# Patient Record
Sex: Female | Born: 1960
Health system: Southern US, Community
[De-identification: ages and names within clinical notes are randomized; demographics above are authoritative.]

## PROBLEM LIST (undated history)

## (undated) DIAGNOSIS — T7840XA Allergy, unspecified, initial encounter: Secondary | ICD-10-CM

## (undated) DIAGNOSIS — C801 Malignant (primary) neoplasm, unspecified: Secondary | ICD-10-CM

## (undated) DIAGNOSIS — Z8489 Family history of other specified conditions: Secondary | ICD-10-CM

## (undated) DIAGNOSIS — R112 Nausea with vomiting, unspecified: Secondary | ICD-10-CM

## (undated) DIAGNOSIS — E079 Disorder of thyroid, unspecified: Secondary | ICD-10-CM

## (undated) DIAGNOSIS — K589 Irritable bowel syndrome without diarrhea: Secondary | ICD-10-CM

## (undated) DIAGNOSIS — M1990 Unspecified osteoarthritis, unspecified site: Secondary | ICD-10-CM

## (undated) DIAGNOSIS — M069 Rheumatoid arthritis, unspecified: Secondary | ICD-10-CM

## (undated) DIAGNOSIS — K219 Gastro-esophageal reflux disease without esophagitis: Secondary | ICD-10-CM

## (undated) DIAGNOSIS — R519 Headache, unspecified: Secondary | ICD-10-CM

## (undated) DIAGNOSIS — J189 Pneumonia, unspecified organism: Secondary | ICD-10-CM

## (undated) DIAGNOSIS — Z9889 Other specified postprocedural states: Secondary | ICD-10-CM

## (undated) DIAGNOSIS — E039 Hypothyroidism, unspecified: Secondary | ICD-10-CM

## (undated) HISTORY — DX: Rheumatoid arthritis, unspecified: M06.9

## (undated) HISTORY — DX: Allergy, unspecified, initial encounter: T78.40XA

## (undated) HISTORY — DX: Unspecified osteoarthritis, unspecified site: M19.90

## (undated) HISTORY — DX: Irritable bowel syndrome, unspecified: K58.9

## (undated) HISTORY — PX: APPENDECTOMY: SHX54

## (undated) HISTORY — PX: HAND SURGERY: SHX662

## (undated) HISTORY — PX: ABDOMINAL HYSTERECTOMY: SHX81

## (undated) HISTORY — DX: Gastro-esophageal reflux disease without esophagitis: K21.9

---

## 1982-11-09 DIAGNOSIS — N739 Female pelvic inflammatory disease, unspecified: Secondary | ICD-10-CM

## 1982-11-09 HISTORY — DX: Female pelvic inflammatory disease, unspecified: N73.9

## 1982-11-09 HISTORY — PX: LAPAROTOMY: SHX154

## 1987-11-10 HISTORY — PX: LAPAROSCOPIC ENDOMETRIOSIS FULGURATION: SUR769

## 2001-11-09 HISTORY — PX: SINUS SURGERY WITH INSTATRAK: SHX5215

## 2005-11-09 HISTORY — PX: DIAGNOSTIC LAPAROSCOPY: SUR761

## 2009-08-07 ENCOUNTER — Ambulatory Visit: Payer: Self-pay | Admitting: Obstetrics and Gynecology

## 2009-08-16 ENCOUNTER — Emergency Department: Payer: Self-pay | Admitting: Internal Medicine

## 2009-09-06 ENCOUNTER — Ambulatory Visit: Payer: Self-pay | Admitting: Gastroenterology

## 2012-07-26 ENCOUNTER — Ambulatory Visit (INDEPENDENT_AMBULATORY_CARE_PROVIDER_SITE_OTHER): Payer: BC Managed Care – PPO | Admitting: Family Medicine

## 2012-07-26 VITALS — BP 102/66 | HR 64 | Temp 98.4°F | Resp 16 | Ht 63.0 in | Wt 120.6 lb

## 2012-07-26 DIAGNOSIS — J019 Acute sinusitis, unspecified: Secondary | ICD-10-CM

## 2012-07-26 DIAGNOSIS — M940 Chondrocostal junction syndrome [Tietze]: Secondary | ICD-10-CM

## 2012-07-26 DIAGNOSIS — J40 Bronchitis, not specified as acute or chronic: Secondary | ICD-10-CM

## 2012-07-26 MED ORDER — HYDROCOD POLST-CHLORPHEN POLST 10-8 MG/5ML PO LQCR: 5.0000 mL | Freq: Two times a day (BID) | ORAL | Status: DC | PRN

## 2012-07-26 MED ORDER — AZITHROMYCIN 250 MG PO TABS: ORAL_TABLET | ORAL | Status: DC

## 2012-07-26 NOTE — Patient Instructions (Addendum)
Costochondritis Costochondritis (Tietze syndrome), or costochondral separation, is a swelling and irritation (inflammation) of the tissue (cartilage) that connects your ribs with your breastbone (sternum). It may occur on its own (spontaneously), through damage caused by an accident (trauma), or simply from coughing or minor exercise. It may take up to 6 weeks to get better and longer if you are unable to be conservative in your activities. HOME CARE INSTRUCTIONS   Avoid exhausting physical activity. Try not to strain your ribs during normal activity. This would include any activities using chest, belly (abdominal), and side muscles, especially if heavy weights are used.   Use ice for 15 to 20 minutes per hour while awake for the first 2 days. Place the ice in a plastic bag, and place a towel between the bag of ice and your skin.   Only take over-the-counter or prescription medicines for pain, discomfort, or fever as directed by your caregiver.  SEEK IMMEDIATE MEDICAL CARE IF:   Your pain increases or you are very uncomfortable.   You have a fever.   You develop difficulty with your breathing.   You cough up blood.   You develop worse chest pains, shortness of breath, sweating, or vomiting.   You develop new, unexplained problems (symptoms).  MAKE SURE YOU:   Understand these instructions.   Will watch your condition.   Will get help right away if you are not doing well or get worse.  Document Released: 08/05/2005 Document Revised: 10/15/2011 Document Reviewed: 06/13/2008 [facility] Patient Information 2012 [place], [place].Sinusitis Sinuses are air pockets within the bones of your face. The growth of bacteria within a sinus leads to infection. The infection prevents the sinuses from draining. This infection is called sinusitis. SYMPTOMS  There will be different areas of pain depending on which sinuses have become infected.  The maxillary sinuses often produce pain beneath the eyes.     Frontal sinusitis may cause pain in the middle of the forehead and above the eyes.  Other problems (symptoms) include:  Toothaches.   Colored, pus-like (purulent) drainage from the nose.   Swelling, warmth, and tenderness over the sinus areas may be signs of infection.  TREATMENT  Sinusitis is most often determined by an exam.X-rays may be taken. If x-rays have been taken, make sure you obtain your results or find out how you are to obtain them. Your caregiver may give you medications (antibiotics). These are medications that will help kill the bacteria causing the infection. You may also be given a medication (decongestant) that helps to reduce sinus swelling.  HOME CARE INSTRUCTIONS   Only take over-the-counter or prescription medicines for pain, discomfort, or fever as directed by your caregiver.   Drink extra fluids. Fluids help thin the mucus so your sinuses can drain more easily.   Applying either moist heat or ice packs to the sinus areas may help relieve discomfort.   Use saline nasal sprays to help moisten your sinuses. The sprays can be found at your local drugstore.  SEEK IMMEDIATE MEDICAL CARE IF:  You have a fever.   You have increasing pain, severe headaches, or toothache.   You have nausea, vomiting, or drowsiness.   You develop unusual swelling around the face or trouble seeing.  MAKE SURE YOU:   Understand these instructions.   Will watch your condition.   Will get help right away if you are not doing well or get worse.  Document Released: 10/26/2005 Document Revised: 10/15/2011 Document Reviewed: 05/25/2007 [employer] Patient Information 2012  [employer].Bronchitis Bronchitis is the body's way of reacting to injury and/or infection (inflammation) of the bronchi. Bronchi are the air tubes that extend from the windpipe into the lungs. If the inflammation becomes severe, it may cause shortness of breath. CAUSES  Inflammation may be caused by:  A virus.    Germs (bacteria).   Dust.   Allergens.   Pollutants and many other irritants.  The cells lining the bronchial tree are covered with tiny hairs (cilia). These constantly beat upward, away from the lungs, toward the mouth. This keeps the lungs free of pollutants. When these cells become too irritated and are unable to do their job, mucus begins to develop. This causes the characteristic cough of bronchitis. The cough clears the lungs when the cilia are unable to do their job. Without either of these protective mechanisms, the mucus would settle in the lungs. Then you would develop pneumonia. Smoking is a common cause of bronchitis and can contribute to pneumonia. Stopping this habit is the single most important thing you can do to help yourself. TREATMENT   Your caregiver may prescribe an antibiotic if the cough is caused by bacteria. Also, medicines that open up your airways make it easier to breathe. Your caregiver may also recommend or prescribe an expectorant. It will loosen the mucus to be coughed up. Only take over-the-counter or prescription medicines for pain, discomfort, or fever as directed by your caregiver.   Removing whatever causes the problem (smoking, for example) is critical to preventing the problem from getting worse.   Cough suppressants may be prescribed for relief of cough symptoms.   Inhaled medicines may be prescribed to help with symptoms now and to help prevent problems from returning.   For those with recurrent (chronic) bronchitis, there may be a need for steroid medicines.  SEEK IMMEDIATE MEDICAL CARE IF:   During treatment, you develop more pus-like mucus (purulent sputum).   You have a fever.   Your baby is older than 3 months with a rectal temperature of 102 F (38.9 C) or higher.   Your baby is 22 months old or younger with a rectal temperature of 100.4 F (38 C) or higher.   You become progressively more ill.   You have increased difficulty  breathing, wheezing, or shortness of breath.  It is necessary to seek immediate medical care if you are elderly or sick from any other disease. MAKE SURE YOU:   Understand these instructions.   Will watch your condition.   Will get help right away if you are not doing well or get worse.  Document Released: 10/26/2005 Document Revised: 10/15/2011 Document Reviewed: 09/04/2008 [facility] Patient Information 2012 [place], [place].

## 2012-07-26 NOTE — Progress Notes (Signed)
  Subjective:    Patient ID: Tina Olsen, female    DOB: [birth year], 51 y.o.   MRN: [id number]  HPI Pt became ill 1 wk ago w/ pharyngitis and sinus drainage. Then developed severe HA in occipital area, then developed dry cough about 3d ago but has continued to worsen. Has had a fever at night. Cough has now turned productive, lots of sick contacts. Has been very stressed out about work so not sleeping. Now having pressure in center of chest and back worse with cough. Chest tightness is constant x 3d and getting worse.  Not exertional.     Review of Systems  Constitutional: Positive for fever and fatigue. Negative for chills.  HENT: Positive for congestion, sore throat, rhinorrhea, neck pain, postnasal drip and sinus pressure. Negative for ear pain and neck stiffness.   Respiratory: Positive for cough and chest tightness. Negative for shortness of breath and wheezing.   Cardiovascular: Positive for chest pain. Negative for palpitations and leg swelling.  Gastrointestinal: Positive for nausea and diarrhea. Negative for vomiting, abdominal pain and constipation.  Genitourinary: Negative for difficulty urinating.  Musculoskeletal: Positive for back pain.  Skin: Negative for rash.  Neurological: Positive for headaches. Negative for syncope.  Psychiatric/Behavioral: Positive for disturbed wake/sleep cycle.       Objective:   Physical Exam  Vitals reviewed. Constitutional: She is oriented to person, place, and time. She appears well-developed and well-nourished.  HENT:  Head: Normocephalic and atraumatic.  Right Ear: Tympanic membrane, external ear and ear canal normal.  Left Ear: Tympanic membrane, external ear and ear canal normal.  Nose: Nose normal.  Mouth/Throat: Oropharynx is clear and moist. No oropharyngeal exudate.  Eyes: Conjunctivae normal and EOM are normal. Pupils are equal, round, and reactive to light. No scleral icterus.  Neck: Normal range of motion. Neck supple. No  thyromegaly present.  Cardiovascular: Normal rate, regular rhythm, normal heart sounds and intact distal pulses.   Pulmonary/Chest: Effort normal and breath sounds normal. No respiratory distress. She has no wheezes. She has no rales. She exhibits tenderness.  Lymphadenopathy:    She has cervical adenopathy.  Neurological: She is alert and oriented to person, place, and time.  Skin: Skin is warm and dry.  Psychiatric: She has a normal mood and affect. Her behavior is normal.          Assessment & Plan:  1. Sinusitis/bronchitis/costochondritis - pt prefers z-pack for abx treatment. Discussed symptomatic care w/ sinus rinse, nsaids. RTC if worsens at all for CXR - pt agrees.

## 2012-07-26 NOTE — Progress Notes (Signed)
Reviewed and agree.

## 2012-12-27 ENCOUNTER — Encounter: Payer: Self-pay | Admitting: Family Medicine

## 2012-12-27 ENCOUNTER — Ambulatory Visit: Payer: BC Managed Care – PPO

## 2012-12-27 ENCOUNTER — Ambulatory Visit (INDEPENDENT_AMBULATORY_CARE_PROVIDER_SITE_OTHER): Payer: BC Managed Care – PPO | Admitting: Family Medicine

## 2012-12-27 VITALS — BP 109/69 | HR 59 | Temp 98.2°F | Resp 16 | Ht 63.0 in | Wt 126.0 lb

## 2012-12-27 DIAGNOSIS — R079 Chest pain, unspecified: Secondary | ICD-10-CM

## 2012-12-27 DIAGNOSIS — R0781 Pleurodynia: Secondary | ICD-10-CM

## 2012-12-27 DIAGNOSIS — M25519 Pain in unspecified shoulder: Secondary | ICD-10-CM
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
## 2014-01-17 ENCOUNTER — Ambulatory Visit (INDEPENDENT_AMBULATORY_CARE_PROVIDER_SITE_OTHER): Payer: BC Managed Care – PPO | Admitting: Family Medicine

## 2014-01-17 VITALS — BP 100/60 | HR 58 | Temp 98.4°F | Resp 16 | Ht 63.0 in | Wt 127.0 lb

## 2014-01-17 DIAGNOSIS — M79644 Pain in right finger(s): Secondary | ICD-10-CM

## 2014-01-17 DIAGNOSIS — M79609 Pain in unspecified limb: Secondary | ICD-10-CM

## 2014-01-17 LAB — POCT SEDIMENTATION RATE: POCT SED RATE: 26 mm/hr — AB (ref 0–22)

## 2014-01-17 LAB — RHEUMATOID FACTOR: Rhuematoid fact SerPl-aCnc: 11 IU/mL (ref <=14)
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
## 2014-01-17 NOTE — Patient Instructions (Signed)
Schirmer test when you see the eye doctor

## 2014-01-18 ENCOUNTER — Telehealth: Payer: Self-pay

## 2014-01-18 LAB — ANA: Anti Nuclear Antibody(ANA): NEGATIVE

## 2014-01-18 MED ORDER — PREDNISONE 20 MG PO TABS: ORAL_TABLET | ORAL | Status: DC

## 2014-01-18 NOTE — Telephone Encounter (Signed)
See labs. Pt wants to know since everything was normal, if she should do a round of steroids. Please advise.

## 2014-01-18 NOTE — Telephone Encounter (Signed)
Patient called regarding lab results. Please return call  

## 2014-01-18 NOTE — Addendum Note (Signed)
Addended by: Robyn Haber on: 01/18/2014 08:46 PM   Modules accepted: Orders

## 2014-01-26 ENCOUNTER — Telehealth: Payer: Self-pay

## 2014-01-26 DIAGNOSIS — M79644 Pain in right finger(s): Secondary | ICD-10-CM

## 2014-01-26 NOTE — Telephone Encounter (Signed)
Patient states she was on prednisone for 5 days. Yesterday was her last day on the medication and she states the swelling on her R hand just started to reduce then. Patient states her hand is still sore and she notices redness today. She wants to know if she needs to continue on the medication or be seen. Please return call. Thank you.

## 2014-01-28 MED ORDER — PREDNISONE 20 MG PO TABS: ORAL_TABLET | ORAL | Status: DC

## 2014-01-28 NOTE — Telephone Encounter (Signed)
Needs to RTC if not improved

## 2014-01-29 NOTE — Telephone Encounter (Signed)
LM- advising pt rx sent to the pharmacy and to RTC if not improving.

## 2014-02-12 ENCOUNTER — Ambulatory Visit: Payer: Self-pay | Admitting: Obstetrics and Gynecology

## 2014-06-12 ENCOUNTER — Ambulatory Visit: Payer: Self-pay | Admitting: Obstetrics and Gynecology

## 2014-06-12 LAB — BASIC METABOLIC PANEL
ANION GAP: 6 — AB (ref 7–16)
BUN: 21 mg/dL — AB (ref 7–18)
CO2: 28 mmol/L (ref 21–32)
Calcium, Total: 8.9 mg/dL (ref 8.5–10.1)
Chloride: 107 mmol/L (ref 98–107)
Creatinine: 1.01 mg/dL (ref 0.60–1.30)
EGFR (African American): >60
Glucose: 91 mg/dL (ref 65–99)
OSMOLALITY: 284 (ref 275–301)
Potassium: 3.8 mmol/L (ref 3.5–5.1)
SODIUM: 141 mmol/L (ref 136–145)

## 2014-06-12 LAB — HEMOGLOBIN: HGB: 13.2 g/dL (ref 12.0–16.0)

## 2014-06-18 ENCOUNTER — Inpatient Hospital Stay: Payer: Self-pay | Admitting: Obstetrics and Gynecology

## 2014-06-19 LAB — BASIC METABOLIC PANEL
Anion Gap: 5 — ABNORMAL LOW (ref 7–16)
BUN: 13 mg/dL (ref 7–18)
CALCIUM: 8.8 mg/dL (ref 8.5–10.1)
CREATININE: 0.99 mg/dL (ref 0.60–1.30)
Chloride: 108 mmol/L — ABNORMAL HIGH (ref 98–107)
Co2: 26 mmol/L (ref 21–32)
EGFR (African American): >60
EGFR (Non-African Amer.): >60
Glucose: 139 mg/dL — ABNORMAL HIGH (ref 65–99)
Osmolality: 280 (ref 275–301)
Potassium: 4.4 mmol/L (ref 3.5–5.1)
Sodium: 139 mmol/L (ref 136–145)

## 2014-06-19 LAB — HEMATOCRIT: HCT: 37.3 % (ref 35.0–47.0)

## 2014-06-21 LAB — PATHOLOGY REPORT

## 2015-03-02 NOTE — Op Note (Signed)
PATIENT NAME:  Tina Olsen, Tina Olsen MR#:  [id number] DATE OF BIRTH:  06/10/1961  DATE OF PROCEDURE:  06/18/2014  PREOPERATIVE DIAGNOSIS: Persistent cervical dysplasia.   POSTOPERATIVE DIAGNOSES: 1. Persistent cervical dysplasia.  2. Pelvic adhesions.  3. Endometriosis.   ANESTHESIA: General endotracheal anesthesia.   PROCEDURES:  1. Laparoscopic evaluation with conversion to exploratory laparotomy.  2. Total abdominal hysterectomy and right salpingectomy.   SURGEON: Boykin Nearing, M.D.   FIRST ASSISTANT: Benjaman Kindler, M.D.   PROCEDURE: After adequate general endotracheal anesthesia, the patient was placed in the dorsal supine position, legs placed in the [place] stirrups. Abdominal, perineal and vaginal prep was performed. The patient was straight catheterized yielding 100 mL of clear urine. The patient did receive 2 grams IV cefoxitin prior to commencement of the case. Gloves were changed and a 12 mm infraumbilical incision was made after injection with 0.5% Marcaine. The laparoscope was advanced into the abdominal cavity under direct visualization with the Optiview cannula. The patient's abdomen was insufflated. The initial impression showed that there was omental adhesions draping over the infraumbilical port site. Could not visualize the anterior abdominal wall being there was a full shelf of omental adhesions over the top of this. After evaluating for 10 minutes, the surgeon felt that it would be prudent to convert this to an abdominal hysterectomy given the poor visualization.   The patient's abdomen was deflated. The infraumbilical incision was closed with a 2-0 Vicryl deep layer and an interrupted 4-0 Vicryl incisional layer. Dr. Benjaman Kindler, who is new to [facility], scrubbed in and was used as a Equities trader.   A Pfannenstiel incision was made 2 fingerbreadths above the symphysis pubis. Sharp dissection was used to identify the fascia. The fascia was opened  in the midline and opened in a transverse fashion. The superior aspect of the fascia was grasped with Coker clamps and the recti muscles were dissected free. The inferior aspect of the fascia was grasped with Coker clamps and the pyramidalis muscle was dissected free. Entry into the peritoneal cavity was accomplished sharply. The peritoneum was opened in a caudad and cephalad fashion. The O'Connor-O'Sullivan retractor was brought up to the operative field, and the bowel was packed cephalad. There were several dense adhesions of the lateral aspect of the descending sigmoid to the left cornua. Of note, the patient is status post a left salpingo-oophorectomy for a prior tubal ovarian abscess. Two large Kelly's were placed on the cornua bilaterally and the round ligaments were bilaterally suture ligated and transected with 0 Vicryl suture. The anterior leaf of the broad ligament was incised and a bladder flap was created and the bladder was reflected inferiorly. Gentle dissection of the left sigmoid off from the medial aspect of the broad ligament aided for making a window in the left broad ligament. Two Heaney-Ballantine clamps were placed through this window and the pedicle was transected and doubly ligated with 0 Vicryl suture.   A similar procedure was repeated on the patient's right side. Through this window, the proximal portion of the fallopian tube and tubo-ovarian ligaments were incorporated in this. The uterine arteries were bilaterally skeletonized and doubly clamped with Heaney-Ballantine clamps and transected and suture ligated with 0 Vicryl suture. Straight ligaments were used to clamp the cardinal ligaments, and, again, these pedicles were ligated with 0 Vicryl suture. Ultimately curved Heaney clamps were placed in the vaginal angles and the vagina was entered with Jorgenson scissors and the cervix and uterus were removed intact.   Angles  were closed with 0 Vicryl suture and the rest of the vaginal  cuff was closed with interrupted 0 Vicryl suture. Of note, there was a hemosiderin nodule in the central portion between the uterosacral ligaments approximately 5 x 5 mm.   Attention was directed to the patient's right fallopian tube, which the fimbriated end was grasped with a Babcock clamp and the fallopian tube was then clamped and removed and sutured with 0 Vicryl suture. Good hemostasis was noted from this pedicle. The patient's abdomen was copiously irrigated. Good hemostasis was noted, and all sutures were removed from the vaginal cuff. The right ureter was identified with normal peristaltic activity and the left was not identified given the adhesions to the left sidewall.   All sponges and instruments were then removed from the patient's abdomen, and the fascia was then closed with a running 0 Vicryl suture. The subcutaneous tissues were irrigated and bovied for hemostasis, and the skin was reapproximated with absorbable staples, Insorb stapler used. The patient tolerated the procedure well.   Estimated blood loss 50 mL. Urine output 100 mL. Intraoperative fluids 1500 mL.   The patient was taken to the recovery room in good condition.     ____________________________ Boykin Nearing, MD tjs:jr D: 06/18/2014 12:51:30 ET T: 06/18/2014 16:35:42 ET JOB#: [id number]  cc: Boykin Nearing, MD, <Dictator> Boykin Nearing MD ELECTRONICALLY SIGNED 06/19/2014 9:06

## 2015-07-14 ENCOUNTER — Ambulatory Visit (INDEPENDENT_AMBULATORY_CARE_PROVIDER_SITE_OTHER): Payer: BLUE CROSS/BLUE SHIELD | Admitting: Internal Medicine

## 2015-07-14 VITALS — BP 108/66 | HR 55 | Temp 98.6°F | Resp 16 | Ht 63.5 in | Wt 130.4 lb

## 2015-07-14 DIAGNOSIS — S71151A Open bite, right thigh, initial encounter: Secondary | ICD-10-CM

## 2015-07-14 DIAGNOSIS — W540XXA Bitten by dog, initial encounter: Secondary | ICD-10-CM

## 2015-07-14 MED ORDER — AMOXICILLIN-POT CLAVULANATE 875-125 MG PO TABS: 1.0000 | ORAL_TABLET | Freq: Two times a day (BID) | ORAL | Status: DC

## 2015-07-14 NOTE — Patient Instructions (Signed)
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
## 2015-11-06 ENCOUNTER — Ambulatory Visit (INDEPENDENT_AMBULATORY_CARE_PROVIDER_SITE_OTHER): Payer: BLUE CROSS/BLUE SHIELD | Admitting: Family Medicine

## 2015-11-06 VITALS — BP 110/70 | HR 77 | Temp 98.1°F | Resp 20 | Ht 63.0 in | Wt 131.8 lb

## 2015-11-06 DIAGNOSIS — Z8742 Personal history of other diseases of the female genital tract: Secondary | ICD-10-CM

## 2015-11-06 DIAGNOSIS — Z23 Encounter for immunization: Secondary | ICD-10-CM | POA: Diagnosis not present

## 2015-11-06 DIAGNOSIS — J019 Acute sinusitis, unspecified: Secondary | ICD-10-CM | POA: Diagnosis not present

## 2015-11-06 DIAGNOSIS — Z8619 Personal history of other infectious and parasitic diseases: Secondary | ICD-10-CM

## 2015-11-06 MED ORDER — FLUCONAZOLE 150 MG PO TABS: 150.0000 mg | ORAL_TABLET | Freq: Once | ORAL | Status: DC

## 2015-11-06 MED ORDER — AMOXICILLIN-POT CLAVULANATE 875-125 MG PO TABS: 1.0000 | ORAL_TABLET | Freq: Two times a day (BID) | ORAL | Status: DC

## 2015-11-06 NOTE — Progress Notes (Signed)
Subjective:  By signing my name below, I, Tina Olsen, attest that this documentation has been prepared under the direction and in the presence of Tina Ray, MD.  Electronically Signed: [name], ED Scribe. 11/06/2015. 4:55 PM.   Patient ID: Tina Olsen, female    DOB: May 12, 1961, 54 y.o.   MRN: [id number]  HPI Chief Complaint  Patient presents with  . Sinusitis    x 2 week  . Nasal Congestion  . Flu Vaccine   HPI Comments: STPHANIE Olsen is a 53 y.o. female who presents to the [facility] complaining of sinus congestion that began 1.5 weeks ago. She reports symptoms initially consisted of fever, nasal congestion, ear pressure, sinus pressure, productive cough, chest congestion, HA , and dental pain. States symptoms started to improve but worsened 1 week ago. Reports fever, sinus pressure, ear pressures and lymphadenopathy has subsided. States cough was initially dry but has become productive. She has been taking mucinex and cough syrup with some relief. Pt is allergic to Sulfa abx.   Pt is also requesting a flu shot.  There are no active problems to display for this patient.  Past Medical History  Diagnosis Date  . Allergy   . Arthritis    Past Surgical History  Procedure Laterality Date  . Sinus surgery with instatrak  2003  . Abdominal hysterectomy     Allergies  Allergen Reactions  . Sulfa Antibiotics Rash   Prior to Admission medications   Medication Sig Start Date End Date Taking? Authorizing Provider  calcium-vitamin D (OSCAL WITH D) 500-200 MG-UNIT per tablet Take 3 tablets by mouth daily.   Yes Historical Provider, MD  cetirizine (ZYRTEC) 10 MG tablet Take 10 mg by mouth daily.   Yes Historical Provider, MD  DICLOFENAC PO Take by mouth.   Yes Historical Provider, MD  triamcinolone (NASACORT) 55 MCG/ACT AERO nasal inhaler Place 2 sprays into the nose daily.   Yes Historical Provider, MD  amoxicillin-clavulanate (AUGMENTIN)  875-125 MG per tablet Take 1 tablet by mouth 2 (two) times daily. With food Patient not taking: Reported on 11/06/2015 07/14/15   Tina Koyanagi, MD   Social History   Social History  . Marital Status: Single    Spouse Name: N/A  . Number of Children: N/A  . Years of Education: N/A   Occupational History  . Not on file.   Social History Main Topics  . Smoking status: Former Smoker    Quit date: 05/09/2002  . Smokeless tobacco: Not on file  . Alcohol Use: Not on file  . Drug Use: Not on file  . Sexual Activity: Not on file   Other Topics Concern  . Not on file   Social History Narrative   Review of Systems  Constitutional: Positive for fever ( improved), chills and fatigue.  HENT: Positive for congestion, dental problem, ear pain ( improved) and sinus pressure.   Respiratory: Positive for cough.   Gastrointestinal: Negative for vomiting and diarrhea.  Neurological: Positive for headaches.       Objective:   Physical Exam  Constitutional: She is oriented to person, place, and time. She appears well-developed and well-nourished. No distress.  HENT:  Head: Normocephalic and atraumatic. Not macrocephalic.  Right Ear: Hearing, tympanic membrane, external ear and ear canal normal.  Left Ear: Hearing, tympanic membrane, external ear and ear canal normal.  Nose: Right sinus exhibits frontal sinus tenderness. Right sinus exhibits no maxillary sinus tenderness. Left sinus exhibits  maxillary sinus tenderness and frontal sinus tenderness.  Mouth/Throat: Oropharynx is clear and moist. No oropharyngeal exudate.  TM's Pearly grey.  Slight erythema and edema of the turbinates. Slight yellow discharge.   Eyes: Conjunctivae and EOM are normal. Pupils are equal, round, and reactive to light.  Neck: Neck supple.  Cardiovascular: Normal rate, regular rhythm, normal heart sounds and intact distal pulses.   No murmur heard. Pulmonary/Chest: Effort normal and breath sounds normal. No  respiratory distress. She has no wheezes. She has no rhonchi.  Musculoskeletal: Normal range of motion.  Neurological: She is alert and oriented to person, place, and time.  Skin: Skin is warm and dry. No rash noted.  Psychiatric: She has a normal mood and affect. Her behavior is normal.  Nursing note and vitals reviewed.  Filed Vitals:   11/06/15 1445  BP: 110/70  Pulse: 77  Temp: 98.1 F (36.7 C)  TempSrc: Oral  Resp: 20  Height: 5\' 3"  (1.6 m)  Weight: 131 lb 12.8 oz (59.784 kg)  SpO2: 98%    Assessment & Plan:   [name] is a 54 y.o. female Acute sinusitis, recurrence not specified, unspecified location - Plan: amoxicillin-clavulanate (AUGMENTIN) 875-125 MG tablet  - secondary sinusitis after initial URI likely. Afebrile, reassuring exam.  -Augmentin 875 mm twice a day 10 days. Side effects discussed.  -Diflucan if needed as history of yeast  infections after antibiotics.  -RTC precautions.  Flu vaccine need - Plan: Flu Vaccine QUAD 36+ mos IM given.  History of candidal vulvovaginitis - Plan: fluconazole (DIFLUCAN) 150 MG tablet  -Diflucan given if needed as above.  Meds ordered this encounter  Medications  . amoxicillin-clavulanate (AUGMENTIN) 875-125 MG tablet    Sig: Take 1 tablet by mouth 2 (two) times daily.    Dispense:  20 tablet    Refill:  0  . fluconazole (DIFLUCAN) 150 MG tablet    Sig: Take 1 tablet (150 mg total) by mouth once.    Dispense:  1 tablet    Refill:  1   Patient Instructions  Saline nasal spray atleast 4 times per day, over the counter mucinex as needed, drink plenty of fluids. Augmentin as discussed. Diflucan if needed after completion of antibiotic and symptoms of a yeast infection occur.  Return to the clinic or go to the nearest emergency room if any of your symptoms worsen or new symptoms occur.  Sinusitis, Adult Sinusitis is redness, soreness, and inflammation of the paranasal sinuses. Paranasal sinuses are air pockets  within the bones of your face. They are located beneath your eyes, in the middle of your forehead, and above your eyes. In healthy paranasal sinuses, mucus is able to drain out, and air is able to circulate through them by way of your nose. However, when your paranasal sinuses are inflamed, mucus and air can become trapped. This can allow bacteria and other germs to grow and cause infection. Sinusitis can develop quickly and last only a short time (acute) or continue over a long period (chronic). Sinusitis that lasts for more than 12 weeks is considered chronic. CAUSES Causes of sinusitis include:  Allergies.  Structural abnormalities, such as displacement of the cartilage that separates your nostrils (deviated septum), which can decrease the air flow through your nose and sinuses and affect sinus drainage.  Functional abnormalities, such as when the Olsen hairs (cilia) that line your sinuses and help remove mucus do not work properly or are not present. SIGNS AND SYMPTOMS Symptoms of acute  and chronic sinusitis are the same. The primary symptoms are pain and pressure around the affected sinuses. Other symptoms include:  Upper toothache.  Earache.  Headache.  Bad breath.  Decreased sense of smell and taste.  A cough, which worsens when you are lying flat.  Fatigue.  Fever.  Thick drainage from your nose, which often is green and may contain pus (purulent).  Swelling and warmth over the affected sinuses. DIAGNOSIS Your health care provider will perform a physical exam. During your exam, your health care provider may perform any of the following to help determine if you have acute sinusitis or chronic sinusitis:  Look in your nose for signs of abnormal growths in your nostrils (nasal polyps).  Tap over the affected sinus to check for signs of infection.  View the inside of your sinuses using an imaging device that has a light attached (endoscope). If your health care provider  suspects that you have chronic sinusitis, one or more of the following tests may be recommended:  Allergy tests.  Nasal culture. A sample of mucus is taken from your nose, sent to a lab, and screened for bacteria.  Nasal cytology. A sample of mucus is taken from your nose and examined by your health care provider to determine if your sinusitis is related to an allergy. TREATMENT Most cases of acute sinusitis are related to a viral infection and will resolve on their own within 10 days. Sometimes, medicines are prescribed to help relieve symptoms of both acute and chronic sinusitis. These may include pain medicines, decongestants, nasal steroid sprays, or saline sprays. However, for sinusitis related to a bacterial infection, your health care provider will prescribe antibiotic medicines. These are medicines that will help kill the bacteria causing the infection. Rarely, sinusitis is caused by a fungal infection. In these cases, your health care provider will prescribe antifungal medicine. For some cases of chronic sinusitis, surgery is needed. Generally, these are cases in which sinusitis recurs more than 3 times per year, despite other treatments. HOME CARE INSTRUCTIONS  Drink plenty of water. Water helps thin the mucus so your sinuses can drain more easily.  Use a humidifier.  Inhale steam 3-4 times a day (for example, sit in the bathroom with the shower running).  Apply a warm, moist washcloth to your face 3-4 times a day, or as directed by your health care provider.  Use saline nasal sprays to help moisten and clean your sinuses.  Take medicines only as directed by your health care provider.  If you were prescribed either an antibiotic or antifungal medicine, finish it all even if you start to feel better. SEEK IMMEDIATE MEDICAL CARE IF:  You have increasing pain or severe headaches.  You have nausea, vomiting, or drowsiness.  You have swelling around your face.  You have vision  problems.  You have a stiff neck.  You have difficulty breathing.   This information is not intended to replace advice given to you by your health care provider. Make sure you discuss any questions you have with your health care provider.   Document Released: 10/26/2005 Document Revised: 11/16/2014 Document Reviewed: 11/10/2011 Elsevier Interactive Patient Education [employer].    I personally performed the services described in this documentation, which was scribed in my presence. The recorded information has been reviewed and considered, and addended by me as needed.

## 2015-11-06 NOTE — Patient Instructions (Signed)
Saline nasal spray atleast 4 times per day, over the counter mucinex as needed, drink plenty of fluids. Augmentin as discussed. Diflucan if needed after completion of antibiotic and symptoms of a yeast infection occur.  Return to the clinic or go to the nearest emergency room if any of your symptoms worsen or new symptoms occur.  Sinusitis, Adult Sinusitis is redness, soreness, and inflammation of the paranasal sinuses. Paranasal sinuses are air pockets within the bones of your face. They are located beneath your eyes, in the middle of your forehead, and above your eyes. In healthy paranasal sinuses, mucus is able to drain out, and air is able to circulate through them by way of your nose. However, when your paranasal sinuses are inflamed, mucus and air can become trapped. This can allow bacteria and other germs to grow and cause infection. Sinusitis can develop quickly and last only a short time (acute) or continue over a long period (chronic). Sinusitis that lasts for more than 12 weeks is considered chronic. CAUSES Causes of sinusitis include:  Allergies.  Structural abnormalities, such as displacement of the cartilage that separates your nostrils (deviated septum), which can decrease the air flow through your nose and sinuses and affect sinus drainage.  Functional abnormalities, such as when the small hairs (cilia) that line your sinuses and help remove mucus do not work properly or are not present. SIGNS AND SYMPTOMS Symptoms of acute and chronic sinusitis are the same. The primary symptoms are pain and pressure around the affected sinuses. Other symptoms include:  Upper toothache.  Earache.  Headache.  Bad breath.  Decreased sense of smell and taste.  A cough, which worsens when you are lying flat.  Fatigue.  Fever.  Thick drainage from your nose, which often is green and may contain pus (purulent).  Swelling and warmth over the affected sinuses. DIAGNOSIS Your health care  provider will perform a physical exam. During your exam, your health care provider may perform any of the following to help determine if you have acute sinusitis or chronic sinusitis:  Look in your nose for signs of abnormal growths in your nostrils (nasal polyps).  Tap over the affected sinus to check for signs of infection.  View the inside of your sinuses using an imaging device that has a light attached (endoscope). If your health care provider suspects that you have chronic sinusitis, one or more of the following tests may be recommended:  Allergy tests.  Nasal culture. A sample of mucus is taken from your nose, sent to a lab, and screened for bacteria.  Nasal cytology. A sample of mucus is taken from your nose and examined by your health care provider to determine if your sinusitis is related to an allergy. TREATMENT Most cases of acute sinusitis are related to a viral infection and will resolve on their own within 10 days. Sometimes, medicines are prescribed to help relieve symptoms of both acute and chronic sinusitis. These may include pain medicines, decongestants, nasal steroid sprays, or saline sprays. However, for sinusitis related to a bacterial infection, your health care provider will prescribe antibiotic medicines. These are medicines that will help kill the bacteria causing the infection. Rarely, sinusitis is caused by a fungal infection. In these cases, your health care provider will prescribe antifungal medicine. For some cases of chronic sinusitis, surgery is needed. Generally, these are cases in which sinusitis recurs more than 3 times per year, despite other treatments. HOME CARE INSTRUCTIONS  Drink plenty of water. Water helps thin  the mucus so your sinuses can drain more easily.  Use a humidifier.  Inhale steam 3-4 times a day (for example, sit in the bathroom with the shower running).  Apply a warm, moist washcloth to your face 3-4 times a day, or as directed by  your health care provider.  Use saline nasal sprays to help moisten and clean your sinuses.  Take medicines only as directed by your health care provider.  If you were prescribed either an antibiotic or antifungal medicine, finish it all even if you start to feel better. SEEK IMMEDIATE MEDICAL CARE IF:  You have increasing pain or severe headaches.  You have nausea, vomiting, or drowsiness.  You have swelling around your face.  You have vision problems.  You have a stiff neck.  You have difficulty breathing.   This information is not intended to replace advice given to you by your health care provider. Make sure you discuss any questions you have with your health care provider.   Document Released: 10/26/2005 Document Revised: 11/16/2014 Document Reviewed: 11/10/2011 Elsevier Interactive Patient Education [employer].

## 2015-11-10 DIAGNOSIS — E079 Disorder of thyroid, unspecified: Secondary | ICD-10-CM

## 2015-11-10 HISTORY — PX: BREAST BIOPSY: SHX20

## 2015-11-10 HISTORY — DX: Disorder of thyroid, unspecified: E07.9

## 2016-01-16 ENCOUNTER — Ambulatory Visit (INDEPENDENT_AMBULATORY_CARE_PROVIDER_SITE_OTHER): Payer: BLUE CROSS/BLUE SHIELD | Admitting: Family Medicine

## 2016-01-16 VITALS — BP 128/68 | HR 70 | Temp 97.5°F | Resp 19 | Ht 63.0 in | Wt 133.8 lb

## 2016-01-16 DIAGNOSIS — J019 Acute sinusitis, unspecified: Secondary | ICD-10-CM | POA: Diagnosis not present

## 2016-01-16 DIAGNOSIS — B373 Candidiasis of vulva and vagina: Secondary | ICD-10-CM

## 2016-01-16 DIAGNOSIS — B3731 Acute candidiasis of vulva and vagina: Secondary | ICD-10-CM

## 2016-01-16 DIAGNOSIS — Q181 Preauricular sinus and cyst: Secondary | ICD-10-CM | POA: Diagnosis not present

## 2016-01-16 MED ORDER — FLUCONAZOLE 150 MG PO TABS: 150.0000 mg | ORAL_TABLET | Freq: Once | ORAL | Status: DC

## 2016-01-16 MED ORDER — AMOXICILLIN-POT CLAVULANATE 875-125 MG PO TABS: 1.0000 | ORAL_TABLET | Freq: Two times a day (BID) | ORAL | Status: DC

## 2016-01-16 NOTE — Progress Notes (Signed)
Patient ID: Tina Olsen, female    DOB: 1961-07-31  Age: 55 y.o. MRN: [id number]  Chief Complaint  Patient presents with  . Sinusitis    x 3 week    Subjective:   Patient has a history of chronic recurrent sinusitis. She has not been running a fever but she's had purulent discharge from last 3 weeks. Started with a cold and then it is stated on with facial pain. She coughs up mucus that she thinks is just from postnasal drainage.  Current allergies, medications, problem list, past/family and social histories reviewed.  Objective:  BP 128/68 mmHg  Pulse 70  Temp(Src) 97.5 F (36.4 C) (Oral)  Resp 19  Ht 5\' 3"  (1.6 m)  Wt 133 lb 12.8 oz (60.691 kg)  BMI 23.71 kg/m2  SpO2 98%  TMs are normal. She has a poor 5 mm cyst on the floor of the left ear canal. Her throat is clear. Neck supple without nodes. She's tender over maxillary sinuses. Chest clear to auscultation. Heart regular without murmur.  Assessment & Plan:   Assessment: 1. Acute sinusitis, recurrence not specified, unspecified location   2. Cyst of ear canal   3. Monilial vaginitis       Plan: Treat maxillary sinusitis. Offered ENT referral but she will decide when she wants to do that.  No orders of the defined types were placed in this encounter.    Meds ordered this encounter  Medications  . fluconazole (DIFLUCAN) 150 MG tablet    Sig: Take 1 tablet (150 mg total) by mouth once.    Dispense:  2 tablet    Refill:  1  . amoxicillin-clavulanate (AUGMENTIN) 875-125 MG tablet    Sig: Take 1 tablet by mouth 2 (two) times daily.    Dispense:  28 tablet    Refill:  0         Patient Instructions   Take the Augmentin( amoxicillinclavuanate )1 twice daiy  Use diflucan if needed  See ENT if desired for cyst ear canal  Use flonase daily  Return if needed     Return if symptoms worsen or fail to improve.   Ataya Murdy, MD 01/16/2016

## 2016-01-16 NOTE — Patient Instructions (Signed)
Take the Augmentin( amoxicillinclavuanate )1 twice daiy  Use diflucan if needed  See ENT if desired for cyst ear canal  Use flonase daily  Return if needed

## 2016-02-03 ENCOUNTER — Other Ambulatory Visit: Payer: Self-pay | Admitting: Physician Assistant

## 2016-02-03 DIAGNOSIS — Z1231 Encounter for screening mammogram for malignant neoplasm of breast: Secondary | ICD-10-CM

## 2016-02-10 ENCOUNTER — Ambulatory Visit
Admission: RE | Admit: 2016-02-10 | Discharge: 2016-02-10 | Disposition: A | Discharge status: Home / Self Care | Payer: BLUE CROSS/BLUE SHIELD | Source: Ambulatory Visit | Attending: Physician Assistant | Admitting: Physician Assistant

## 2016-02-10 DIAGNOSIS — Z1231 Encounter for screening mammogram for malignant neoplasm of breast: Secondary | ICD-10-CM | POA: Diagnosis not present

## 2016-02-10 DIAGNOSIS — E538 Deficiency of other specified B group vitamins: Secondary | ICD-10-CM | POA: Diagnosis not present

## 2016-02-13 DIAGNOSIS — R946 Abnormal results of thyroid function studies: Secondary | ICD-10-CM | POA: Diagnosis not present

## 2016-02-13 DIAGNOSIS — E538 Deficiency of other specified B group vitamins: Secondary | ICD-10-CM | POA: Diagnosis not present

## 2016-02-13 DIAGNOSIS — R7989 Other specified abnormal findings of blood chemistry: Secondary | ICD-10-CM | POA: Insufficient documentation

## 2016-02-13 DIAGNOSIS — F520 Hypoactive sexual desire disorder: Secondary | ICD-10-CM | POA: Diagnosis not present

## 2016-02-19 ENCOUNTER — Other Ambulatory Visit: Payer: Self-pay | Admitting: Obstetrics and Gynecology

## 2016-02-19 DIAGNOSIS — E063 Autoimmune thyroiditis: Secondary | ICD-10-CM | POA: Diagnosis not present

## 2016-02-19 DIAGNOSIS — L570 Actinic keratosis: Secondary | ICD-10-CM | POA: Diagnosis not present

## 2016-02-19 DIAGNOSIS — E038 Other specified hypothyroidism: Secondary | ICD-10-CM | POA: Diagnosis not present

## 2016-02-19 DIAGNOSIS — F520 Hypoactive sexual desire disorder: Secondary | ICD-10-CM | POA: Diagnosis not present

## 2016-02-19 DIAGNOSIS — R928 Other abnormal and inconclusive findings on diagnostic imaging of breast: Secondary | ICD-10-CM

## 2016-02-24 ENCOUNTER — Ambulatory Visit
Admission: RE | Admit: 2016-02-24 | Discharge: 2016-02-24 | Disposition: A | Discharge status: Home / Self Care | Payer: BLUE CROSS/BLUE SHIELD | Source: Ambulatory Visit | Attending: Obstetrics and Gynecology | Admitting: Obstetrics and Gynecology

## 2016-02-24 DIAGNOSIS — R928 Other abnormal and inconclusive findings on diagnostic imaging of breast: Secondary | ICD-10-CM

## 2016-02-24 DIAGNOSIS — N63 Unspecified lump in breast: Secondary | ICD-10-CM | POA: Insufficient documentation

## 2016-02-24 DIAGNOSIS — N6489 Other specified disorders of breast: Secondary | ICD-10-CM | POA: Diagnosis not present

## 2016-02-25 ENCOUNTER — Other Ambulatory Visit: Payer: Self-pay | Admitting: Obstetrics and Gynecology

## 2016-02-25 DIAGNOSIS — N63 Unspecified lump in unspecified breast: Secondary | ICD-10-CM

## 2016-03-04 ENCOUNTER — Ambulatory Visit: Payer: BLUE CROSS/BLUE SHIELD

## 2016-03-05 DIAGNOSIS — F520 Hypoactive sexual desire disorder: Secondary | ICD-10-CM | POA: Diagnosis not present

## 2016-03-10 ENCOUNTER — Ambulatory Visit
Admission: RE | Admit: 2016-03-10 | Discharge: 2016-03-10 | Disposition: A | Discharge status: Home / Self Care | Payer: BLUE CROSS/BLUE SHIELD | Source: Ambulatory Visit | Attending: Obstetrics and Gynecology | Admitting: Obstetrics and Gynecology

## 2016-03-10 DIAGNOSIS — N63 Unspecified lump in unspecified breast: Secondary | ICD-10-CM

## 2016-03-10 DIAGNOSIS — D242 Benign neoplasm of left breast: Secondary | ICD-10-CM | POA: Insufficient documentation

## 2016-03-11 DIAGNOSIS — E538 Deficiency of other specified B group vitamins: Secondary | ICD-10-CM | POA: Diagnosis not present

## 2016-03-11 DIAGNOSIS — L570 Actinic keratosis: Secondary | ICD-10-CM | POA: Diagnosis not present

## 2016-03-11 LAB — SURGICAL PATHOLOGY

## 2016-03-12 DIAGNOSIS — F520 Hypoactive sexual desire disorder: Secondary | ICD-10-CM | POA: Diagnosis not present

## 2016-03-25 DIAGNOSIS — E038 Other specified hypothyroidism: Secondary | ICD-10-CM | POA: Diagnosis not present

## 2016-03-25 DIAGNOSIS — E063 Autoimmune thyroiditis: Secondary | ICD-10-CM | POA: Diagnosis not present

## 2016-05-13 DIAGNOSIS — E538 Deficiency of other specified B group vitamins: Secondary | ICD-10-CM | POA: Diagnosis not present

## 2016-05-13 DIAGNOSIS — L570 Actinic keratosis: Secondary | ICD-10-CM | POA: Diagnosis not present

## 2016-05-13 DIAGNOSIS — F520 Hypoactive sexual desire disorder: Secondary | ICD-10-CM | POA: Diagnosis not present

## 2016-05-14 DIAGNOSIS — M2550 Pain in unspecified joint: Secondary | ICD-10-CM | POA: Diagnosis not present

## 2016-05-14 DIAGNOSIS — Z79899 Other long term (current) drug therapy: Secondary | ICD-10-CM | POA: Diagnosis not present

## 2016-05-14 DIAGNOSIS — M0579 Rheumatoid arthritis with rheumatoid factor of multiple sites without organ or systems involvement: Secondary | ICD-10-CM | POA: Diagnosis not present

## 2016-05-18 DIAGNOSIS — E038 Other specified hypothyroidism: Secondary | ICD-10-CM | POA: Diagnosis not present

## 2016-05-18 DIAGNOSIS — E063 Autoimmune thyroiditis: Secondary | ICD-10-CM | POA: Diagnosis not present

## 2016-05-20 DIAGNOSIS — E038 Other specified hypothyroidism: Secondary | ICD-10-CM | POA: Diagnosis not present

## 2016-05-20 DIAGNOSIS — E063 Autoimmune thyroiditis: Secondary | ICD-10-CM | POA: Diagnosis not present

## 2016-06-17 DIAGNOSIS — F520 Hypoactive sexual desire disorder: Secondary | ICD-10-CM | POA: Diagnosis not present

## 2016-06-17 DIAGNOSIS — E538 Deficiency of other specified B group vitamins: Secondary | ICD-10-CM | POA: Diagnosis not present

## 2016-07-17 DIAGNOSIS — E538 Deficiency of other specified B group vitamins: Secondary | ICD-10-CM | POA: Diagnosis not present

## 2016-07-17 DIAGNOSIS — F520 Hypoactive sexual desire disorder: Secondary | ICD-10-CM | POA: Diagnosis not present

## 2016-08-17 DIAGNOSIS — F520 Hypoactive sexual desire disorder: Secondary | ICD-10-CM | POA: Diagnosis not present

## 2016-08-17 DIAGNOSIS — E538 Deficiency of other specified B group vitamins: Secondary | ICD-10-CM | POA: Diagnosis not present

## 2016-08-24 DIAGNOSIS — F520 Hypoactive sexual desire disorder: Secondary | ICD-10-CM | POA: Diagnosis not present

## 2016-09-09 DIAGNOSIS — F520 Hypoactive sexual desire disorder: Secondary | ICD-10-CM | POA: Diagnosis not present

## 2016-09-09 DIAGNOSIS — E063 Autoimmune thyroiditis: Secondary | ICD-10-CM | POA: Diagnosis not present

## 2016-09-09 DIAGNOSIS — E038 Other specified hypothyroidism: Secondary | ICD-10-CM | POA: Diagnosis not present

## 2016-09-14 DIAGNOSIS — E538 Deficiency of other specified B group vitamins: Secondary | ICD-10-CM | POA: Diagnosis not present

## 2016-09-14 DIAGNOSIS — F520 Hypoactive sexual desire disorder: Secondary | ICD-10-CM | POA: Diagnosis not present

## 2016-09-15 DIAGNOSIS — L814 Other melanin hyperpigmentation: Secondary | ICD-10-CM | POA: Diagnosis not present

## 2016-09-15 DIAGNOSIS — D1801 Hemangioma of skin and subcutaneous tissue: Secondary | ICD-10-CM | POA: Diagnosis not present

## 2016-09-15 DIAGNOSIS — L821 Other seborrheic keratosis: Secondary | ICD-10-CM | POA: Diagnosis not present

## 2016-09-15 DIAGNOSIS — Z85828 Personal history of other malignant neoplasm of skin: Secondary | ICD-10-CM | POA: Diagnosis not present

## 2016-09-15 DIAGNOSIS — L570 Actinic keratosis: Secondary | ICD-10-CM | POA: Diagnosis not present

## 2016-09-21 DIAGNOSIS — E063 Autoimmune thyroiditis: Secondary | ICD-10-CM | POA: Diagnosis not present

## 2016-09-21 DIAGNOSIS — E038 Other specified hypothyroidism: Secondary | ICD-10-CM | POA: Diagnosis not present

## 2016-10-14 DIAGNOSIS — F520 Hypoactive sexual desire disorder: Secondary | ICD-10-CM | POA: Diagnosis not present

## 2016-10-14 DIAGNOSIS — Z23 Encounter for immunization: Secondary | ICD-10-CM | POA: Diagnosis not present

## 2016-10-26 DIAGNOSIS — M2550 Pain in unspecified joint: Secondary | ICD-10-CM | POA: Diagnosis not present

## 2016-10-26 DIAGNOSIS — M0579 Rheumatoid arthritis with rheumatoid factor of multiple sites without organ or systems involvement: Secondary | ICD-10-CM | POA: Diagnosis not present

## 2016-10-26 DIAGNOSIS — Z79899 Other long term (current) drug therapy: Secondary | ICD-10-CM | POA: Diagnosis not present

## 2016-10-26 DIAGNOSIS — Z6823 Body mass index (BMI) 23.0-23.9, adult: Secondary | ICD-10-CM | POA: Diagnosis not present

## 2016-11-04 DIAGNOSIS — E063 Autoimmune thyroiditis: Secondary | ICD-10-CM | POA: Diagnosis not present

## 2016-11-04 DIAGNOSIS — E038 Other specified hypothyroidism: Secondary | ICD-10-CM | POA: Diagnosis not present

## 2016-11-06 DIAGNOSIS — E538 Deficiency of other specified B group vitamins: Secondary | ICD-10-CM | POA: Diagnosis not present

## 2016-11-06 DIAGNOSIS — F520 Hypoactive sexual desire disorder: Secondary | ICD-10-CM | POA: Diagnosis not present

## 2016-12-04 DIAGNOSIS — F520 Hypoactive sexual desire disorder: Secondary | ICD-10-CM | POA: Diagnosis not present

## 2016-12-04 DIAGNOSIS — E538 Deficiency of other specified B group vitamins: Secondary | ICD-10-CM | POA: Diagnosis not present

## 2017-01-07 DIAGNOSIS — F520 Hypoactive sexual desire disorder: Secondary | ICD-10-CM | POA: Diagnosis not present

## 2017-01-07 DIAGNOSIS — E538 Deficiency of other specified B group vitamins: Secondary | ICD-10-CM | POA: Diagnosis not present

## 2017-01-12 DIAGNOSIS — R946 Abnormal results of thyroid function studies: Secondary | ICD-10-CM | POA: Diagnosis not present

## 2017-01-19 DIAGNOSIS — E038 Other specified hypothyroidism: Secondary | ICD-10-CM | POA: Diagnosis not present

## 2017-01-19 DIAGNOSIS — R4589 Other symptoms and signs involving emotional state: Secondary | ICD-10-CM | POA: Diagnosis not present

## 2017-01-19 DIAGNOSIS — E063 Autoimmune thyroiditis: Secondary | ICD-10-CM | POA: Diagnosis not present

## 2017-01-22 ENCOUNTER — Ambulatory Visit (INDEPENDENT_AMBULATORY_CARE_PROVIDER_SITE_OTHER): Payer: BLUE CROSS/BLUE SHIELD | Admitting: Family Medicine

## 2017-01-22 VITALS — BP 94/60 | HR 62 | Temp 98.5°F | Resp 12 | Ht 63.0 in | Wt 135.4 lb

## 2017-01-22 DIAGNOSIS — J019 Acute sinusitis, unspecified: Secondary | ICD-10-CM | POA: Diagnosis not present

## 2017-01-22 DIAGNOSIS — J3089 Other allergic rhinitis: Secondary | ICD-10-CM | POA: Diagnosis not present

## 2017-01-22 DIAGNOSIS — J309 Allergic rhinitis, unspecified: Secondary | ICD-10-CM | POA: Insufficient documentation

## 2017-01-22 MED ORDER — AMOXICILLIN-POT CLAVULANATE 875-125 MG PO TABS: 1.0000 | ORAL_TABLET | Freq: Two times a day (BID) | ORAL | 0 refills | Status: DC

## 2017-01-22 MED ORDER — FLUCONAZOLE 150 MG PO TABS: 150.0000 mg | ORAL_TABLET | Freq: Once | ORAL | 0 refills | Status: DC

## 2017-01-22 MED ORDER — FLUCONAZOLE 150 MG PO TABS
150.0000 mg | ORAL_TABLET | Freq: Once | ORAL | 0 refills | Status: AC
Start: 2017-01-22 — End: 2017-01-22

## 2017-01-22 NOTE — Progress Notes (Signed)
Chief Complaint  Patient presents with  . Recurrent Sinusitis  . Headache    HPI  Pt reports that she has symptoms of sinusitis She reports that she has been having a week of symptoms + facial pressure + pain around the eyes and worse on the right side + postnasal drip + green nasal discharge She denies fevers or chills She is a nonsmoker She denies asthma   She has been taking afrin for 3 nights and zyrtec Her last episode was 01/16/16  Chronic rhinitis She reports that she has allergy testing that showed that she has mostly indoor allergies primarily dust and thus takes zyrtec She typically gets symptoms spring and fall patterns  Past Medical History:  Diagnosis Date  . Allergy   . Arthritis     Current Outpatient Prescriptions  Medication Sig Dispense Refill  . calcium-vitamin D (OSCAL WITH D) 500-200 MG-UNIT per tablet Take 3 tablets by mouth daily.    . cetirizine (ZYRTEC) 10 MG tablet Take 10 mg by mouth daily.    Marland Kitchen DICLOFENAC PO Take by mouth.    . levothyroxine (SYNTHROID, LEVOTHROID) 75 MCG tablet Take 75 mcg by mouth daily before breakfast.    . phenylephrine (NEO-SYNEPHRINE) 0.25 % nasal spray Place 1 spray into both nostrils every 6 (six) hours as needed for congestion.    . triamcinolone (NASACORT) 55 MCG/ACT AERO nasal inhaler Place 2 sprays into the nose daily.    Marland Kitchen amoxicillin-clavulanate (AUGMENTIN) 875-125 MG tablet Take 1 tablet by mouth 2 (two) times daily. 20 tablet 0  . fluconazole (DIFLUCAN) 150 MG tablet Take 1 tablet (150 mg total) by mouth once. Repeat second dose in 3 days. 2 tablet 0   No current facility-administered medications for this visit.     Allergies:  Allergies  Allergen Reactions  . Sulfa Antibiotics Rash    Past Surgical History:  Procedure Laterality Date  . ABDOMINAL HYSTERECTOMY    . SINUS SURGERY WITH INSTATRAK  2003    Social History   Social History  . Marital status: Single    Spouse name: N/A  . Number of  children: N/A  . Years of education: N/A   Social History Main Topics  . Smoking status: Former Smoker    Quit date: 05/09/2002  . Smokeless tobacco: Never Used  . Alcohol use None  . Drug use: Unknown  . Sexual activity: Not Asked   Other Topics Concern  . None   Social History Narrative  . None    ROS See hpi  Objective: Vitals:   01/22/17 1054  BP: 94/60  Pulse: 62  Resp: 12  Temp: 98.5 F (36.9 C)  TempSrc: Oral  SpO2: 97%  Weight: 135 lb 6.4 oz (61.4 kg)  Height: 5\' 3"  (1.6 m)    Physical Exam General: alert, oriented, in NAD Head: normocephalic, atraumatic, +frontal and maxillary sinus tenderness Eyes: EOM intact, no scleral icterus or conjunctival injection Ears: TM clear bilaterally Throat: no pharyngeal exudate or erythema Lymph: no posterior auricular, submental or cervical lymph adenopathy Heart: normal rate, normal sinus rhythm, no murmurs Lungs: clear to auscultation bilaterally, no wheezing   Assessment and Plan Leisel was seen today for recurrent sinusitis and headache.  Diagnoses and all orders for this visit:  Chronic nonseasonal allergic rhinitis due to other allergen Continue zyrtec Stop afrin and add back flonase  Acute sinusitis, recurrence not specified, unspecified location Pt with another episode of sinusitis Discussed that antibiotics can cause mild to severe reactions  Gave handout with information about antibiotics Advised adding probiotics Also add peppermint tea as a natural aide -     amoxicillin-clavulanate (AUGMENTIN) 875-125 MG tablet; Take 1 tablet by mouth 2 (two) times daily. -     fluconazole (DIFLUCAN) 150 MG tablet; Take 1 tablet (150 mg total) by mouth once. Repeat second dose in 3 days.     [place]

## 2017-01-22 NOTE — Patient Instructions (Addendum)
IF you received an x-ray today, you will receive an invoice from [facility] Radiology. Please contact [facility] at [phone] with questions or concerns regarding your invoice.   IF you received labwork today, you will receive an invoice from [facility]. Please contact [facility] at [phone] with questions or concerns regarding your invoice.   Our billing staff will not be able to assist you with questions regarding bills from these companies.  You will be contacted with the lab results as soon as they are available. The fastest way to get your results is to activate your My Chart account. Instructions are located on the last page of this paperwork. If you have not heard from [place] regarding the results in 2 weeks, please contact this office.     Antibiotic Medicine, Adult Antibiotic medicines treat infections caused by a type of germ called bacteria. They work by killing the bacteria that make you sick. When do I need to take antibiotics? You often need these medicines to treat bacterial infections, such as:  A urinary tract infection (UTI).  Strep throat.  Meningitis. This affects the spinal cord and brain.  A bad lung infection. You may start the medicines while your doctor waits for tests to come back. When the tests come back, your doctor may change or stop your medicine. When are antibiotics not needed? You do not need these medicines for most common illnesses, such as:  A cold.  The flu.  A sore throat. Antibiotics are not always needed for all infections caused by bacteria. Do not ask for these medicines, or take them, when they are not needed. What are the risks of taking antibiotics? Most antibiotics can cause an infection called Clostridium difficile.This causes watery poop (diarrhea). Let your doctor know right away if:  You have watery poop while taking an antibiotic.  You have watery poop after you stop taking an antibiotic. The illness can happen  weeks after you stop the medicine. You also have a risk of getting an infection in the future that antibiotics cannot treat (antibiotic-resistant infection). This type of infection can be dangerous. What else should I know about taking antibiotics?  You need to take the entire prescription.  Take the medicine for as long as told by your doctor.  Do not stop taking it even if you start to feel better.  Try not to miss any doses. If you miss a dose, call your doctor.  Birth control pills may not work. If you take birth control pills:  Keep on taking them.  Use a second form of birth control, such as a condom. Do this for as long as told by your doctor.  Ask your doctor:  How long to wait in between doses.  If you should take the medicine with food.  If there is anything you should stay away from while taking the antibiotic, such as:  Food.  Drinks.  Medicines.  If there are any side effects you should watch for.  Only take the medicines that your doctor told you to take. Do not take medicines that were given to someone else.  Drink a large glass of water with the medicine.  Ask the pharmacist for a tool to measure the medicine, such as:  A syringe.  A cup.  A spoon.  Throw away any extra medicine. Contact a doctor if:  You get worse.  You have new joint pain or muscle aches after starting the medicine.  You have side effects from  the medicine, such as:  Stomach pain.  Watery poop.  Feeling sick to your stomach (nausea). Get help right away if:  You have signs of a very bad allergic reaction. If this happens, stop taking the medicine right away. Signs may include:  Hives. These are raised, itchy, red bumps on the skin.  Skin rash.  Trouble breathing.  Wheezing.  Swelling.  Feeling dizzy.  Throwing up (vomiting).  Your pee (urine) is dark, or is the color of blood.  Your skin turns yellow.  You bruise easily.  You bleed easily.  You  have very bad watery poop and cramps in your belly.  You have a very bad headache. Summary  Antibiotics are often used to treat infections caused by bacteria.  Only take these medicines when needed.  Let your doctor know if you have watery poop while taking an antibiotic.  You need to take the entire prescription. This information is not intended to replace advice given to you by your health care provider. Make sure you discuss any questions you have with your health care provider. Document Released: 08/04/2008 Document Revised: 10/28/2016 Document Reviewed: 10/28/2016 Elsevier Interactive Patient Education  2017 [employer]  Sinusitis, Adult Sinusitis is soreness and inflammation of your sinuses. Sinuses are hollow spaces in the bones around your face. They are located:  Around your eyes.  In the middle of your forehead.  Behind your nose.  In your cheekbones. Your sinuses and nasal passages are lined with a stringy fluid (mucus). Mucus normally drains out of your sinuses. When your nasal tissues get inflamed or swollen, the mucus can get trapped or blocked so air cannot flow through your sinuses. This lets bacteria, viruses, and funguses grow, and that leads to infection. Follow these instructions at home: Medicines   Take, use, or apply over-the-counter and prescription medicines only as told by your doctor. These may include nasal sprays.  If you were prescribed an antibiotic medicine, take it as told by your doctor. Do not stop taking the antibiotic even if you start to feel better. Hydrate and Humidify   Drink enough water to keep your pee (urine) clear or pale yellow.  Use a cool mist humidifier to keep the humidity level in your home above 50%.  Breathe in steam for 10-15 minutes, 3-4 times a day or as told by your doctor. You can do this in the bathroom while a hot shower is running.  Try not to spend time in cool or dry air. Rest   Rest as much as  possible.  Sleep with your head raised (elevated).  Make sure to get enough sleep each night. General instructions   Put a warm, moist washcloth on your face 3-4 times a day or as told by your doctor. This will help with discomfort.  Wash your hands often with soap and water. If there is no soap and water, use hand sanitizer.  Do not smoke. Avoid being around people who are smoking (secondhand smoke).  Keep all follow-up visits as told by your doctor. This is important. Contact a doctor if:  You have a fever.  Your symptoms get worse.  Your symptoms do not get better within 10 days. Get help right away if:  You have a very bad headache.  You cannot stop throwing up (vomiting).  You have pain or swelling around your face or eyes.  You have trouble seeing.  You feel confused.  Your neck is stiff.  You have trouble breathing. This  information is not intended to replace advice given to you by your health care provider. Make sure you discuss any questions you have with your health care provider. Document Released: 04/13/2008 Document Revised: 06/21/2016 Document Reviewed: 08/21/2015 Elsevier Interactive Patient Education  2017 [employer].

## 2017-01-24 ENCOUNTER — Encounter (HOSPITAL_COMMUNITY): Payer: Self-pay | Admitting: *Deleted

## 2017-01-24 ENCOUNTER — Emergency Department (HOSPITAL_COMMUNITY): Payer: BLUE CROSS/BLUE SHIELD

## 2017-01-24 ENCOUNTER — Ambulatory Visit (HOSPITAL_COMMUNITY)
Admission: EM | Admit: 2017-01-24 | Discharge: 2017-01-24 | Disposition: A | Discharge status: Home / Self Care | Payer: BLUE CROSS/BLUE SHIELD | Source: Home / Self Care

## 2017-01-24 ENCOUNTER — Encounter (HOSPITAL_COMMUNITY): Payer: Self-pay | Admitting: Emergency Medicine

## 2017-01-24 ENCOUNTER — Emergency Department (HOSPITAL_COMMUNITY)
Admission: EM | Admit: 2017-01-24 | Discharge: 2017-01-24 | Disposition: A | Discharge status: Home / Self Care | Payer: BLUE CROSS/BLUE SHIELD | Attending: Emergency Medicine | Admitting: Emergency Medicine

## 2017-01-24 DIAGNOSIS — Z0389 Encounter for observation for other suspected diseases and conditions ruled out: Secondary | ICD-10-CM | POA: Diagnosis not present

## 2017-01-24 DIAGNOSIS — M25512 Pain in left shoulder: Secondary | ICD-10-CM | POA: Diagnosis not present

## 2017-01-24 DIAGNOSIS — R091 Pleurisy: Secondary | ICD-10-CM | POA: Insufficient documentation

## 2017-01-24 DIAGNOSIS — Z87891 Personal history of nicotine dependence: Secondary | ICD-10-CM | POA: Insufficient documentation

## 2017-01-24 DIAGNOSIS — Z79899 Other long term (current) drug therapy: Secondary | ICD-10-CM | POA: Diagnosis not present

## 2017-01-24 DIAGNOSIS — R079 Chest pain, unspecified: Secondary | ICD-10-CM | POA: Diagnosis not present

## 2017-01-24 HISTORY — DX: Disorder of thyroid, unspecified: E07.9

## 2017-01-24 LAB — I-STAT TROPONIN, ED: TROPONIN I, POC: 0 ng/mL (ref 0.00–0.08)

## 2017-01-24 LAB — D-DIMER, QUANTITATIVE (NOT AT ARMC): D-Dimer, Quant: 1.03 ug/mL-FEU — ABNORMAL HIGH (ref 0.00–0.50)

## 2017-01-24 MED ORDER — IOPAMIDOL (ISOVUE-370) INJECTION 76%
INTRAVENOUS | Status: AC
  Administered 2017-01-24: 100 mL
  Filled 2017-01-24: qty 100

## 2017-01-24 NOTE — ED Provider Notes (Signed)
CSN: [id number]     Arrival date & time 01/24/17  1408 History   None    Chief Complaint  Patient presents with  . Neck Pain  . Shoulder Pain   (Consider location/radiation/quality/duration/timing/severity/associated sxs/prior Treatment) 56 year old female presents with left sided shoulder pain, radiating down her left side and left chest. The pain started 2 nights ago while she was walking, she states it was very intense, very painful, she is unable to sleep, the pain somewhat resolved the next day, until she rode on her bicycle, and it returned. Her pain partially resolves at rest, and worsens with movement and exertion. She has had some weakness, some dizziness, no shortness of breath, no heart palpitations, no swelling in her hands, feet, or ankles. She is a former smoker, quit 2003, with a 20 year history of smoking. Her mother had a heart attack 2 years ago, there is a family history of both hypertension, diabetes, and MI.   The history is provided by the patient.  Neck Pain  Pain location:  L side Quality:  Stabbing Pain radiates to:  L arm (And left side of her chest) Pain severity:  Moderate Worse during: Worse with exertion. Onset quality:  Sudden Duration:  2 days Timing:  Intermittent Progression:  Waxing and waning Chronicity:  New Context comment:  While walking, and while riding her bicycle Relieved by: Rest. Worsened by:  Stress (Exertion) Associated symptoms: weakness   Associated symptoms: no chest pain, no fever, no headaches, no leg pain, no numbness and no tingling   Shoulder Pain  Associated symptoms: fatigue and neck pain   Associated symptoms: no back pain and no fever     Past Medical History:  Diagnosis Date  . Allergy   . Arthritis   . Thyroid disease    Past Surgical History:  Procedure Laterality Date  . ABDOMINAL HYSTERECTOMY    . SINUS SURGERY WITH INSTATRAK  2003   Family History  Problem Relation Age of Onset  . Colon cancer Paternal  Grandfather    Social History  Substance Use Topics  . Smoking status: Former Smoker    Quit date: 05/09/2002  . Smokeless tobacco: Never Used  . Alcohol use No   OB History    No data available     Review of Systems  Constitutional: Positive for fatigue. Negative for fever.  Respiratory: Positive for shortness of breath. Negative for cough and wheezing.   Cardiovascular: Negative for chest pain, palpitations and leg swelling.  Gastrointestinal: Positive for nausea. Negative for diarrhea and vomiting.  Musculoskeletal: Positive for neck pain. Negative for back pain.  Skin: Negative for color change and pallor.  Neurological: Positive for weakness. Negative for tingling, numbness and headaches.  All other systems reviewed and are negative.   Allergies  Sulfa antibiotics  Home Medications   Prior to Admission medications   Medication Sig Start Date End Date Taking? Authorizing Provider  Acetaminophen (TYLENOL PO) Take by mouth.   Yes Historical Provider, MD  amoxicillin-clavulanate (AUGMENTIN) 875-125 MG tablet Take 1 tablet by mouth 2 (two) times daily. 01/22/17  Yes Zoe A Nolon Rod, MD  calcium-vitamin D (OSCAL WITH D) 500-200 MG-UNIT per tablet Take 3 tablets by mouth daily.   Yes Historical Provider, MD  cetirizine (ZYRTEC) 10 MG tablet Take 10 mg by mouth daily.   Yes Historical Provider, MD  DICLOFENAC PO Take by mouth.   Yes Historical Provider, MD  fluticasone (FLONASE) 50 MCG/ACT nasal spray Place into both nostrils daily.  Yes Historical Provider, MD  levothyroxine (SYNTHROID, LEVOTHROID) 75 MCG tablet Take 75 mcg by mouth daily before breakfast.   Yes Historical Provider, MD  phenylephrine (NEO-SYNEPHRINE) 0.25 % nasal spray Place 1 spray into both nostrils every 6 (six) hours as needed for congestion.    Historical Provider, MD  triamcinolone (NASACORT) 55 MCG/ACT AERO nasal inhaler Place 2 sprays into the nose daily.    Historical Provider, MD   Meds Ordered and  Administered this Visit  Medications - No data to display  BP 120/78   Pulse (!) 56   Temp 98.5 F (36.9 C) (Oral)   Resp 16   SpO2 100%  No data found.   Physical Exam  Constitutional: She is oriented to person, place, and time. She appears well-developed and well-nourished. No distress.  HENT:  Head: Normocephalic and atraumatic.  Right Ear: External ear normal.  Left Ear: External ear normal.  Cardiovascular: Normal rate and regular rhythm.   Pulmonary/Chest: Effort normal and breath sounds normal.  Musculoskeletal: She exhibits no edema or tenderness.  Neurological: She is alert and oriented to person, place, and time.  Skin: Skin is warm and dry. Capillary refill takes less than 2 seconds. No rash noted. She is not diaphoretic. No erythema. No pallor.  Psychiatric: She has a normal mood and affect. Her behavior is normal.  Nursing note and vitals reviewed.   Urgent Care Course     Procedures (including critical care time)  Labs Review Labs Reviewed - No data to display  Imaging Review No results found.     MDM   1. Acute pain of left shoulder    Recommend seeking further treatment and evaluation in the emergency room for possible cardiac involvement, namely due to the worsening of her pain upon exertion, and relief of her pain at rest.     Barnet Glasgow, NP 01/24/17 1545

## 2017-01-24 NOTE — ED Notes (Signed)
Patient transported to CT 

## 2017-01-24 NOTE — ED Notes (Signed)
Pt departed in NAD, refused use of wheelchair.  

## 2017-01-24 NOTE — Discharge Instructions (Signed)
Please read attached information. If you experience any new or worsening signs or symptoms please return to the emergency room for evaluation. Please follow-up with your primary care provider or specialist as discussed.  °

## 2017-01-24 NOTE — ED Triage Notes (Signed)
Reports sudden onset left neck and shoulder pain while walking 2 nights ago. Pain has been intermittent, but lasting long intervals.  Denies any change in pain with movement or palpation.  Today tried to ride bike - pain felt worse along with pain under left breast; c/o painful breathing during that episode.  C/O nausea, but feels it was due to pain level at that time.  Denies any chest pain at present.  Denies any parasthesias.

## 2017-01-24 NOTE — ED Notes (Signed)
Patient transported to X-ray 

## 2017-01-24 NOTE — ED Triage Notes (Signed)
Pt sent from urgent care for further eval of left shoulder pain and neck pain since Friday.

## 2017-01-24 NOTE — ED Provider Notes (Signed)
[facility] DEPT Provider Note   CSN: [id number] Arrival date & time: 01/24/17  1544     History   Chief Complaint Chief Complaint  Patient presents with  . Neck Pain  . Shoulder Pain    HPI Tina Olsen is a 56 y.o. female.  HPI    56 year old female presents today with complaints of left shoulder neck pain.  Patient reports symptoms started approximately 2 days ago when walking.  She notes this was very sharp in nature in the posterior trapezius with radiation up into her neck.  She notes that the next day symptoms seem to be improved, was able to get on her road bike and go for a long 50 mile ride.  She notes she was having pain throughout the right but was able to finish it.  Patient notes that when the pain comes on she does have some shortness of breath associated with this.  She notes that today she attempted to ride her bike, but was unable to due to significant pain.  She notes that pain is worsened with deep inspiration, no worsening symptoms with ambulation.  She cannot reproduce pain with movements of her neck shoulder or chest.  She reports now she is having radiation of symptoms around the lateral aspect of her chest.  She denies any baseline shortness of breath, cough, fever.  Patient denies any significant history of PE or DVT, she denies any lower extremity swelling or edema.  Patient does report that she is taking small doses of testosterone.  Patient denies any significant cardiac history, former smoker quit approximately 15 years ago, no history of hyperlipidemia, hypertension, diabetes, or family history of MI.  She denies any abdominal pain or trauma.   Past Medical History:  Diagnosis Date  . Allergy   . Arthritis   . Thyroid disease     Patient Active Problem List   Diagnosis Date Noted  . Allergic rhinitis due to allergen 01/22/2017    Past Surgical History:  Procedure Laterality Date  . ABDOMINAL HYSTERECTOMY    . SINUS SURGERY WITH  INSTATRAK  2003    OB History    No data available       Home Medications    Prior to Admission medications   Medication Sig Start Date End Date Taking? Authorizing Provider  Acetaminophen (TYLENOL PO) Take by mouth.    Historical Provider, MD  amoxicillin-clavulanate (AUGMENTIN) 875-125 MG tablet Take 1 tablet by mouth 2 (two) times daily. 01/22/17   Forrest Moron, MD  calcium-vitamin D (OSCAL WITH D) 500-200 MG-UNIT per tablet Take 3 tablets by mouth daily.    Historical Provider, MD  cetirizine (ZYRTEC) 10 MG tablet Take 10 mg by mouth daily.    Historical Provider, MD  DICLOFENAC PO Take by mouth.    Historical Provider, MD  fluticasone (FLONASE) 50 MCG/ACT nasal spray Place into both nostrils daily.    Historical Provider, MD  levothyroxine (SYNTHROID, LEVOTHROID) 75 MCG tablet Take 75 mcg by mouth daily before breakfast.    Historical Provider, MD  phenylephrine (NEO-SYNEPHRINE) 0.25 % nasal spray Place 1 spray into both nostrils every 6 (six) hours as needed for congestion.    Historical Provider, MD  triamcinolone (NASACORT) 55 MCG/ACT AERO nasal inhaler Place 2 sprays into the nose daily.    Historical Provider, MD    Family History Family History  Problem Relation Age of Onset  . Colon cancer Paternal Grandfather     Social History Social History  Substance Use Topics  . Smoking status: Former Smoker    Quit date: 05/09/2002  . Smokeless tobacco: Never Used  . Alcohol use No     Allergies   Sulfa antibiotics   Review of Systems Review of Systems  All other systems reviewed and are negative.    Physical Exam Updated Vital Signs BP 99/72 (BP Location: Left Arm)   Pulse 60   Temp 97.9 F (36.6 C) (Oral)   Resp 14   Ht 5\' 3"  (1.6 m)   Wt 60.3 kg   SpO2 99%   BMI 23.56 kg/m   Physical Exam  Constitutional: She is oriented to person, place, and time. She appears well-developed and well-nourished.  HENT:  Head: Normocephalic and atraumatic.  Eyes:  Conjunctivae are normal. Pupils are equal, round, and reactive to light. Right eye exhibits no discharge. Left eye exhibits no discharge. No scleral icterus.  Neck: Normal range of motion. No JVD present. No tracheal deviation present.  Cardiovascular: Normal rate, regular rhythm, normal heart sounds and intact distal pulses.   Pulmonary/Chest: Effort normal and breath sounds normal. No stridor. No respiratory distress. She has no wheezes. She has no rales. She exhibits no tenderness.  Musculoskeletal: Normal range of motion. She exhibits no edema, tenderness or deformity.  No C,T or L-spine tenderness.  No pain or neurological symptoms with axial load.  Back nontender to palpation full active range of motion of the neck and shoulders without pain.  Chest nontender to palpation.   Neurological: She is alert and oriented to person, place, and time. Coordination normal.  Skin: Skin is warm.  Psychiatric: She has a normal mood and affect. Her behavior is normal. Judgment and thought content normal.  Nursing note and vitals reviewed.    ED Treatments / Results  Labs (all labs ordered are listed, but only abnormal results are displayed) Labs Reviewed  D-DIMER, QUANTITATIVE (NOT AT [facility]) - Abnormal; Notable for the following:       Result Value   D-Dimer, Quant 1.03 (*)    All other components within normal limits  I-STAT TROPOININ, ED    EKG  EKG Interpretation None       Radiology Dg Chest 2 View  Result Date: 01/24/2017 CLINICAL DATA:  56 year old female with history of left shoulder pain radiating into the neck 2 days ago, with some left-sided chest pain today. EXAM: CHEST  2 VIEW COMPARISON:  Chest x-ray 12/27/2012. FINDINGS: Lung volumes are normal. No consolidative airspace disease. No pleural effusions. No pneumothorax. No pulmonary nodule or mass noted. Pulmonary vasculature and the cardiomediastinal silhouette are within normal limits. IMPRESSION: No radiographic evidence of  acute cardiopulmonary disease. Electronically Signed   By: Vinnie Langton M.D.   On: 01/24/2017 18:31   Ct Angio Chest Pe W And/or Wo Contrast  Result Date: 01/24/2017 CLINICAL DATA:  Left-sided chest pain. EXAM: CT ANGIOGRAPHY CHEST WITH CONTRAST TECHNIQUE: Multidetector CT imaging of the chest was performed using the standard protocol during bolus administration of intravenous contrast. Multiplanar CT image reconstructions and MIPs were obtained to evaluate the vascular anatomy. CONTRAST:  100 cc Isovue 370 intravenously. COMPARISON:  Chest radiograph 01/24/2017 FINDINGS: Cardiovascular: Satisfactory opacification of the pulmonary arteries to the segmental level. No evidence of pulmonary embolism. Normal heart size. No pericardial effusion. Mediastinum/Nodes: No enlarged mediastinal, hilar, or axillary lymph nodes. Thyroid gland, trachea, and esophagus demonstrate no significant findings. Lungs/Pleura: Lungs are clear. No pleural effusion or pneumothorax. Mild dependent hypoventilatory changes. Upper Abdomen: No acute  abnormality. Musculoskeletal: No chest wall abnormality. No acute or significant osseous findings. Review of the MIP images confirms the above findings. IMPRESSION: Normal CT angiogram of the thorax. Electronically Signed   By: Fidela Salisbury M.D.   On: 01/24/2017 20:09    Procedures Procedures (including critical care time)  Medications Ordered in ED Medications  iopamidol (ISOVUE-370) 76 % injection (100 mLs  Contrast Given 01/24/17 1944)     Initial Impression / Assessment and Plan / ED Course  I have reviewed the triage vital signs and the nursing notes.  Pertinent labs & imaging results that were available during my care of the patient were reviewed by me and considered in my medical decision making (see chart for details).      Final Clinical Impressions(s) / ED Diagnoses   Final diagnoses:  Pleuritis  Acute pain of left shoulder    Labs: I-STAT troponin,  d-dimer  Imaging: DG chest, CT PE  Consults:  Therapeutics:  Discharge Meds:   Assessment/Plan: 56 year old female presents today with complaints of back and shoulder pain.  This appears pleuritic in nature as it is worsened with deep breathing.  Patient has no shortness of breath at rest.  She has no typical ACS type symptoms.  Patient has a normal troponin and reassuring EKG.  Patient is low risk with no typical ACS type symptoms.  No significant concerning symptoms for dissection.  Patient does have concerning symptoms for PE with the pleuritic nature although I find this less likely than musculoskeletal etiology.  Her d-dimer was elevated, CT shows no acute findings.  Patient's symptoms are likely consistent with musculoskeletal pain.  This is worsened with deep breathing and exercise.  Patient is well-appearing with reassuring vital signs.  She will have follow-up with primary care for reassessment, she is given strict return precautions, she verbalized understanding and agreement to today's plan had no further questions or concerns at time of discharge   New Prescriptions Discharge Medication List as of 01/24/2017  8:45 PM       Okey Regal, PA-C 01/24/17 2102    Quintella Reichert, MD 01/25/17 [phone]

## 2017-01-24 NOTE — Discharge Instructions (Signed)
While it is most likely your symptoms are probably musculoskeletal in nature, there are some concerning symptoms in the history, and I think this needs a cardiac workup. We do not have the ability here to draw labs such as troponins, therefore I recommend going to the emergency room for further evaluation.

## 2017-02-01 DIAGNOSIS — M4722 Other spondylosis with radiculopathy, cervical region: Secondary | ICD-10-CM | POA: Diagnosis not present

## 2017-02-06 IMAGING — MG MM DIGITAL DIAGNOSTIC UNILAT*L*
2 series · 2 of 2 positions shown · non-contrast
Comparison: Previous exam(s).

CLINICAL DATA: Status post ultrasound-guided biopsy of a left
breast mass earlier today.

EXAM:
DIAGNOSTIC LEFT MAMMOGRAM POST ULTRASOUND BIOPSY

[L ML]
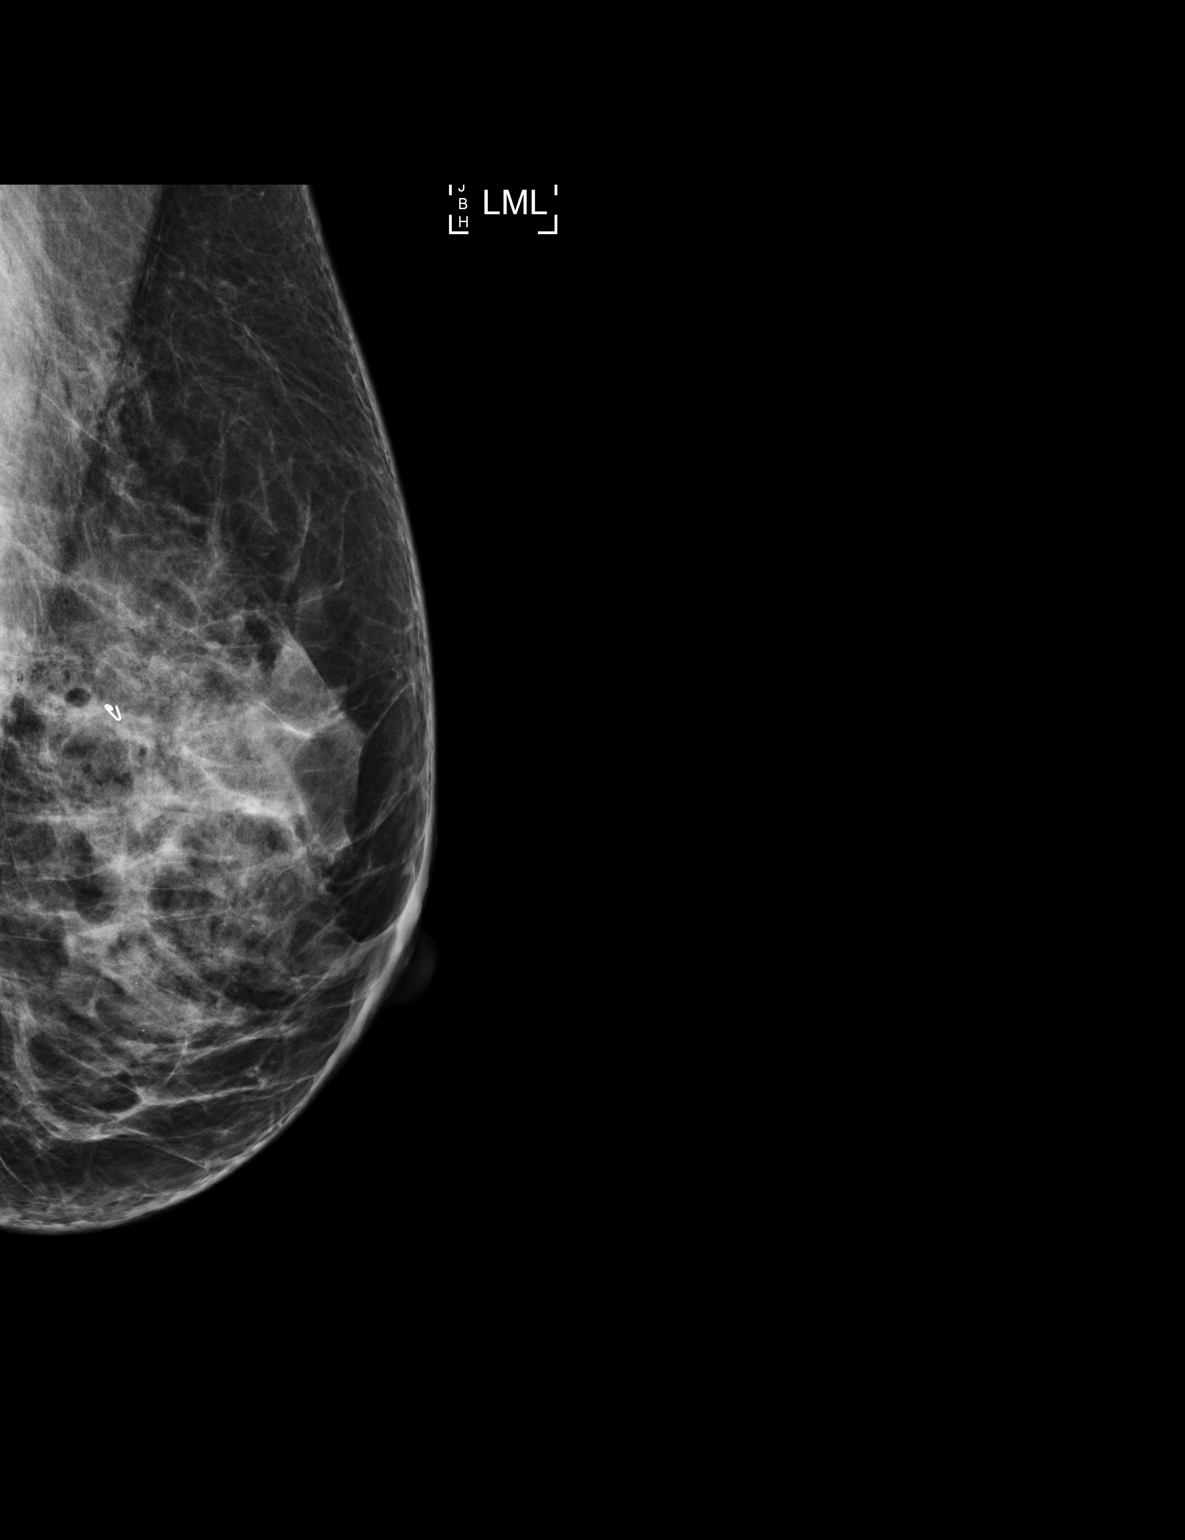

[L CC]
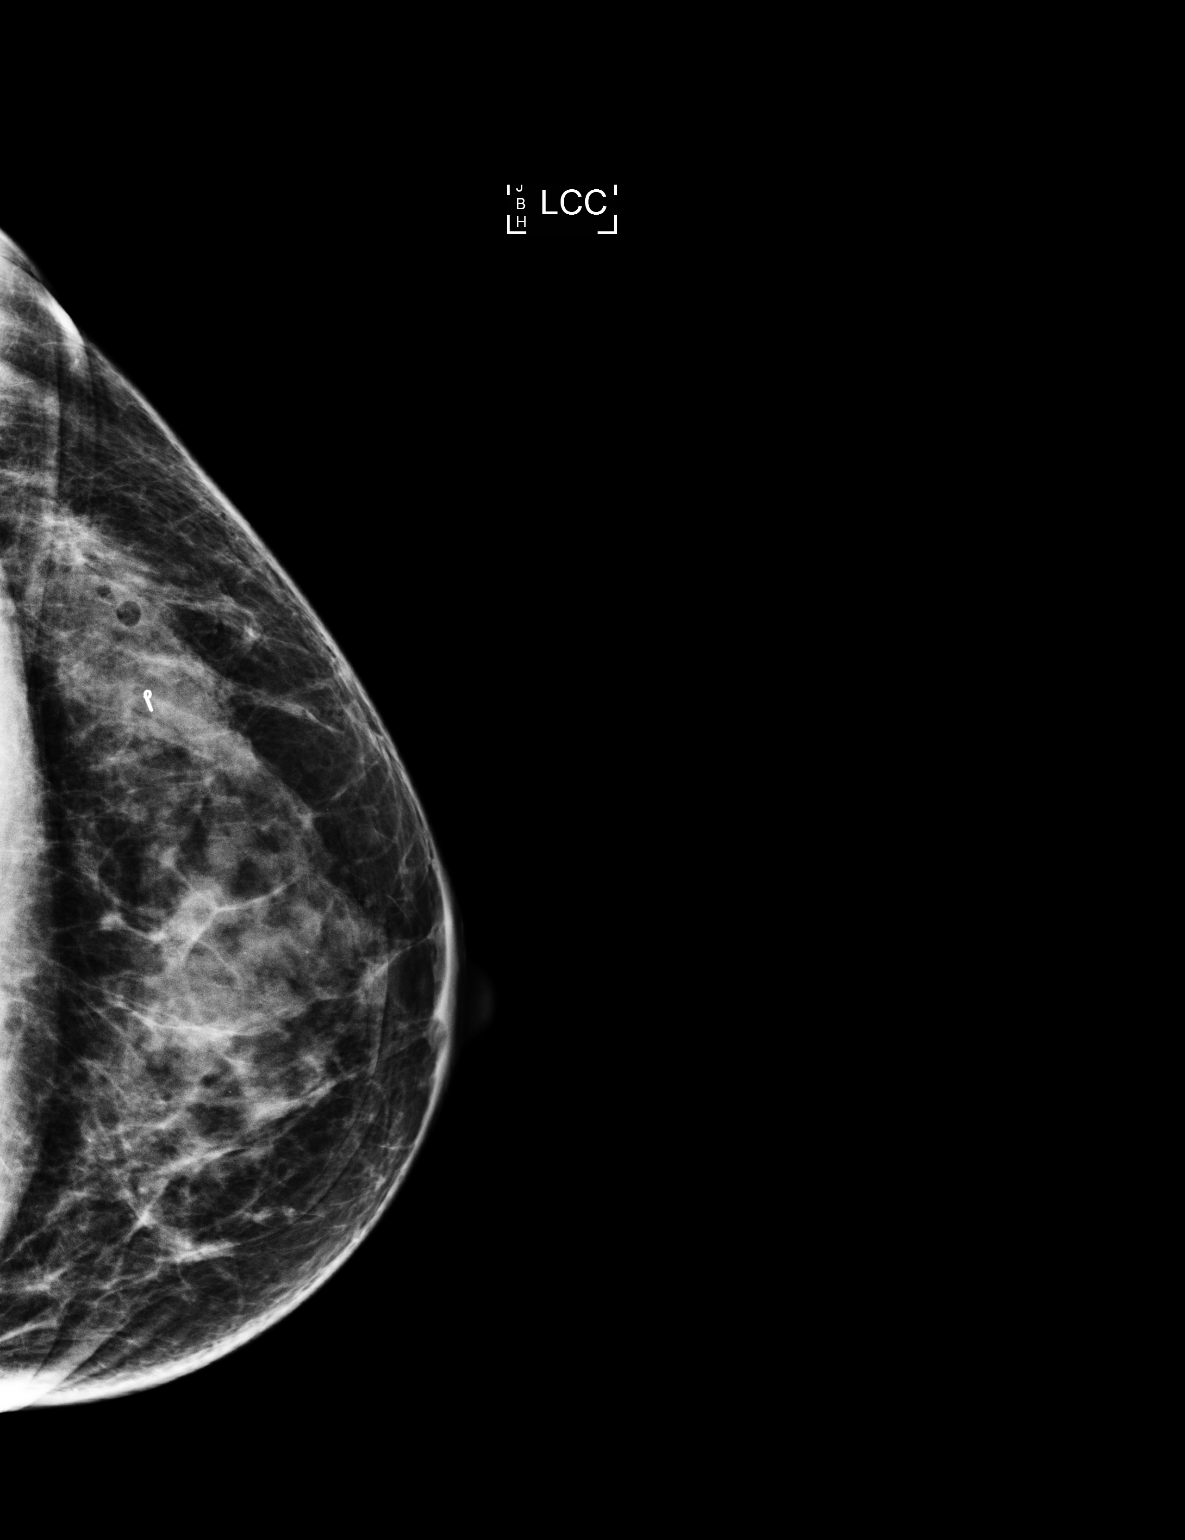

[2 of 2 positions shown; findings below may reference images not displayed]

FINDINGS: Mammographic images were obtained following ultrasound guided biopsy
of the left breast mass at the 3 o'clock axis. At the conclusion of
the procedure, a wing shaped tissue marker was placed at the biopsy
site. Biopsy clip was well seen within the targeted mass on
postprocedure ultrasound evaluation. Biopsy clip appears
appropriately positioned on this diagnostic mammogram.
IMPRESSION: Postprocedure mammogram for clip placement. Biopsy clip is
appropriately positioned.

Final Assessment: Post Procedure Mammograms for Marker Placement

## 2017-02-08 DIAGNOSIS — M542 Cervicalgia: Secondary | ICD-10-CM | POA: Diagnosis not present

## 2017-02-09 DIAGNOSIS — M542 Cervicalgia: Secondary | ICD-10-CM | POA: Diagnosis not present

## 2017-02-10 ENCOUNTER — Other Ambulatory Visit: Payer: Self-pay | Admitting: Obstetrics and Gynecology

## 2017-02-10 DIAGNOSIS — Z1231 Encounter for screening mammogram for malignant neoplasm of breast: Secondary | ICD-10-CM

## 2017-02-10 DIAGNOSIS — E538 Deficiency of other specified B group vitamins: Secondary | ICD-10-CM | POA: Diagnosis not present

## 2017-02-10 DIAGNOSIS — Z01419 Encounter for gynecological examination (general) (routine) without abnormal findings: Secondary | ICD-10-CM | POA: Diagnosis not present

## 2017-02-10 DIAGNOSIS — Z1211 Encounter for screening for malignant neoplasm of colon: Secondary | ICD-10-CM | POA: Diagnosis not present

## 2017-02-10 DIAGNOSIS — F520 Hypoactive sexual desire disorder: Secondary | ICD-10-CM | POA: Diagnosis not present

## 2017-02-15 DIAGNOSIS — M542 Cervicalgia: Secondary | ICD-10-CM | POA: Diagnosis not present

## 2017-02-17 DIAGNOSIS — M542 Cervicalgia: Secondary | ICD-10-CM | POA: Diagnosis not present

## 2017-02-22 DIAGNOSIS — M542 Cervicalgia: Secondary | ICD-10-CM | POA: Diagnosis not present

## 2017-02-24 DIAGNOSIS — M542 Cervicalgia: Secondary | ICD-10-CM | POA: Diagnosis not present

## 2017-03-01 DIAGNOSIS — M542 Cervicalgia: Secondary | ICD-10-CM | POA: Diagnosis not present

## 2017-03-05 ENCOUNTER — Ambulatory Visit
Admission: RE | Admit: 2017-03-05 | Discharge: 2017-03-05 | Disposition: A | Discharge status: Home / Self Care | Payer: BLUE CROSS/BLUE SHIELD | Source: Ambulatory Visit | Attending: Obstetrics and Gynecology | Admitting: Obstetrics and Gynecology

## 2017-03-05 DIAGNOSIS — Z1231 Encounter for screening mammogram for malignant neoplasm of breast: Secondary | ICD-10-CM | POA: Insufficient documentation

## 2017-03-08 ENCOUNTER — Ambulatory Visit (INDEPENDENT_AMBULATORY_CARE_PROVIDER_SITE_OTHER): Payer: BLUE CROSS/BLUE SHIELD | Admitting: Family Medicine

## 2017-03-08 ENCOUNTER — Encounter: Payer: Self-pay | Admitting: Family Medicine

## 2017-03-08 VITALS — BP 96/61 | HR 65 | Temp 98.4°F | Resp 17 | Ht 63.0 in | Wt 136.0 lb

## 2017-03-08 DIAGNOSIS — J029 Acute pharyngitis, unspecified: Secondary | ICD-10-CM | POA: Diagnosis not present

## 2017-03-08 LAB — POCT RAPID STREP A (OFFICE): Rapid Strep A Screen: NEGATIVE

## 2017-03-08 NOTE — Patient Instructions (Addendum)
Drink plenty of fluids and get enough rest  Continue taking your allergy medications  Take Tylenol or ibuprofen as if needed for discomfort or pain or fever, and suck on lozenges or hard candy if that seems to give you some relief.  Return as necessary    IF you received an x-ray today, you will receive an invoice from [facility]. Please contact [facility] at [phone] with questions or concerns regarding your invoice.   IF you received labwork today, you will receive an invoice from [place]. Please contact [facility] at [phone] with questions or concerns regarding your invoice.   Our billing staff will not be able to assist you with questions regarding bills from these companies.  You will be contacted with the lab results as soon as they are available. The fastest way to get your results is to activate your My Chart account. Instructions are located on the last page of this paperwork. If you have not heard from [place] regarding the results in 2 weeks, please contact this office.

## 2017-03-08 NOTE — Progress Notes (Signed)
Patient ID: Tina Olsen, female    DOB: 02-19-1961  Age: 56 y.o. MRN: [id number]  Chief Complaint  Patient presents with  . Sore Throat    onset 3 days  . Headache  . Generalized Body Aches    Subjective:   56 year old lady who is here with a sore throat for last 3 days. She's not had a significant fever though maybe a low-grade 1. She felt a little achy. She has a history of allergies and takes antihistamines. She's had some headaches and body aches. She has a little bit of postnasal drainage. Not blowing much out of her nose or coughing significantly.  Current allergies, medications, problem list, past/family and social histories reviewed.  Objective:  BP 96/61 (BP Location: Right Arm, Patient Position: Sitting, Cuff Size: Normal)   Pulse 65   Temp 98.4 F (36.9 C) (Oral)   Resp 17   Ht 5\' 3"  (1.6 m)   Wt 136 lb (61.7 kg)   SpO2 98%   BMI 24.09 kg/m   Pleasant lady, alert and oriented. 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 TMs are normal. Throat is erythematous. Strep test is negative. Neck supple without significant nodes. Chest is clear to auscultation. Heart regular without murmurs. Results for orders placed or performed in visit on 03/08/17  POCT rapid strep A  Result Value Ref Range   Rapid Strep A Screen Negative Negative    Assessment & Plan:   Assessment: 1. Sore throat       Plan: See instructions. The above diagnoses have been put in an linked to the strep test. She indeed does not have strep sore throat.  Orders Placed This Encounter  Procedures  . POCT rapid strep A    No orders of the defined types were placed in this encounter.        Patient Instructions   Drink plenty of fluids and get enough rest  Continue taking your allergy medications  Take Tylenol or ibuprofen as if needed for discomfort or pain or fever, and suck on lozenges or hard candy if that seems to give you some relief.  Return as necessary    IF you  received an x-ray today, you will receive an invoice from [facility]. Please contact [facility] at [phone] with questions or concerns regarding your invoice.   IF you received labwork today, you will receive an invoice from [place]. Please contact [facility] at [phone] with questions or concerns regarding your invoice.   Our billing staff will not be able to assist you with questions regarding bills from these companies.  You will be contacted with the lab results as soon as they are available. The fastest way to get your results is to activate your My Chart account. Instructions are located on the last page of this paperwork. If you have not heard from [place] regarding the results in 2 weeks, please contact this office.        Return if symptoms worsen or fail to improve.   HOPPER,DAVID, MD 03/08/2017

## 2017-03-15 DIAGNOSIS — Z01419 Encounter for gynecological examination (general) (routine) without abnormal findings: Secondary | ICD-10-CM | POA: Diagnosis not present

## 2017-03-15 DIAGNOSIS — F520 Hypoactive sexual desire disorder: Secondary | ICD-10-CM | POA: Diagnosis not present

## 2017-03-17 DIAGNOSIS — F520 Hypoactive sexual desire disorder: Secondary | ICD-10-CM | POA: Diagnosis not present

## 2017-03-17 DIAGNOSIS — E538 Deficiency of other specified B group vitamins: Secondary | ICD-10-CM | POA: Diagnosis not present

## 2017-03-22 DIAGNOSIS — M0579 Rheumatoid arthritis with rheumatoid factor of multiple sites without organ or systems involvement: Secondary | ICD-10-CM | POA: Diagnosis not present

## 2017-03-22 DIAGNOSIS — R768 Other specified abnormal immunological findings in serum: Secondary | ICD-10-CM | POA: Diagnosis not present

## 2017-03-22 DIAGNOSIS — Z79899 Other long term (current) drug therapy: Secondary | ICD-10-CM | POA: Diagnosis not present

## 2017-03-22 DIAGNOSIS — M2550 Pain in unspecified joint: Secondary | ICD-10-CM | POA: Diagnosis not present

## 2017-04-26 DIAGNOSIS — Z79899 Other long term (current) drug therapy: Secondary | ICD-10-CM | POA: Diagnosis not present

## 2017-04-26 DIAGNOSIS — M0579 Rheumatoid arthritis with rheumatoid factor of multiple sites without organ or systems involvement: Secondary | ICD-10-CM | POA: Diagnosis not present

## 2017-04-27 DIAGNOSIS — F520 Hypoactive sexual desire disorder: Secondary | ICD-10-CM | POA: Diagnosis not present

## 2017-04-27 DIAGNOSIS — E538 Deficiency of other specified B group vitamins: Secondary | ICD-10-CM | POA: Diagnosis not present

## 2017-04-30 DIAGNOSIS — E063 Autoimmune thyroiditis: Secondary | ICD-10-CM | POA: Diagnosis not present

## 2017-04-30 DIAGNOSIS — E038 Other specified hypothyroidism: Secondary | ICD-10-CM | POA: Diagnosis not present

## 2017-05-26 DIAGNOSIS — F520 Hypoactive sexual desire disorder: Secondary | ICD-10-CM | POA: Diagnosis not present

## 2017-05-26 DIAGNOSIS — E538 Deficiency of other specified B group vitamins: Secondary | ICD-10-CM | POA: Diagnosis not present

## 2017-06-01 ENCOUNTER — Encounter: Payer: Self-pay | Admitting: Family Medicine

## 2017-06-01 ENCOUNTER — Ambulatory Visit (INDEPENDENT_AMBULATORY_CARE_PROVIDER_SITE_OTHER): Payer: BLUE CROSS/BLUE SHIELD | Admitting: Family Medicine

## 2017-06-01 VITALS — BP 108/65 | HR 51 | Temp 98.2°F | Resp 18 | Ht 63.0 in | Wt 129.4 lb

## 2017-06-01 DIAGNOSIS — H00011 Hordeolum externum right upper eyelid: Secondary | ICD-10-CM

## 2017-06-01 DIAGNOSIS — F4323 Adjustment disorder with mixed anxiety and depressed mood: Secondary | ICD-10-CM | POA: Diagnosis not present

## 2017-06-01 NOTE — Progress Notes (Signed)
  Chief Complaint  Patient presents with  . Stye    right eye, onset: 05/29/17, started out as a small bump, started hurting 3 hours ago along with facial pain in the area also per patient.    HPI  Patient reports that she saw a stye on her eye since 05/29/17  Past Medical History:  Diagnosis Date  . Allergy   . Arthritis   . Thyroid disease     Current Outpatient Prescriptions  Medication Sig Dispense Refill  . Acetaminophen (TYLENOL PO) Take by mouth.    . cetirizine (ZYRTEC) 10 MG tablet Take 10 mg by mouth daily.    Marland Kitchen DICLOFENAC PO Take by mouth.    . levothyroxine (SYNTHROID, LEVOTHROID) 75 MCG tablet Take 75 mcg by mouth daily before breakfast.    . calcium-vitamin D (OSCAL WITH D) 500-200 MG-UNIT per tablet Take 3 tablets by mouth daily.    . fluticasone (FLONASE) 50 MCG/ACT nasal spray Place into both nostrils daily.     No current facility-administered medications for this visit.     Allergies:  Allergies  Allergen Reactions  . Sulfa Antibiotics Rash    Past Surgical History:  Procedure Laterality Date  . ABDOMINAL HYSTERECTOMY    . BREAST BIOPSY Left 2017   NEG  . SINUS SURGERY WITH INSTATRAK  2003    Social History   Social History  . Marital status: Single    Spouse name: N/A  . Number of children: N/A  . Years of education: N/A   Social History Main Topics  . Smoking status: Former Smoker    Quit date: 05/09/2002  . Smokeless tobacco: Never Used  . Alcohol use No  . Drug use: No  . Sexual activity: Not Asked   Other Topics Concern  . None   Social History Narrative  . None    ROS No vision changes No conjunctivitis No fevers or chills  Objective: Vitals:   06/01/17 1718  BP: 108/65  Pulse: (!) 51  Resp: 18  Temp: 98.2 F (36.8 C)  TempSrc: Oral  SpO2: 95%  Weight: 129 lb 6.4 oz (58.7 kg)  Height: 5\' 3"  (1.6 m)    Physical Exam  Right upper eye lid with raised lesion Normal conjunctiva  Assessment and Plan Tina Olsen was  seen today for stye.  Diagnoses and all orders for this visit:  Hordeolum externum of right upper eyelid  Gave home care Offered reassurance   [place]

## 2017-06-01 NOTE — Patient Instructions (Addendum)
   IF you received an x-ray today, you will receive an invoice from [facility]. Please contact [facility] at [phone] with questions or concerns regarding your invoice.   IF you received labwork today, you will receive an invoice from [facility]. Please contact [facility] at [phone] with questions or concerns regarding your invoice.   Our billing staff will not be able to assist you with questions regarding bills from these companies.  You will be contacted with the lab results as soon as they are available. The fastest way to get your results is to activate your My Chart account. Instructions are located on the last page of this paperwork. If you have not heard from us regarding the results in 2 weeks, please contact this office.    Stye A stye is a bump on your eyelid caused by a bacterial infection. A stye can form inside the eyelid (internal stye) or outside the eyelid (external stye). An internal stye may be caused by an infected oil-producing gland inside your eyelid. An external stye may be caused by an infection at the base of your eyelash (hair follicle). Styes are very common. Anyone can get them at any age. They usually occur in just one eye, but you may have more than one in either eye. What are the causes? The infection is almost always caused by bacteria called Staphylococcus aureus. This is a common type of bacteria that lives on your skin. What increases the risk? You may be at higher risk for a stye if you have had one before. You may also be at higher risk if you have:  Diabetes.  Long-term illness.  Long-term eye redness.  A skin condition called seborrhea.  High fat levels in your blood (lipids).  What are the signs or symptoms? Eyelid pain is the most common symptom of a stye. Internal styes are more painful than external styes. Other signs and symptoms may include:  Painful swelling of your eyelid.  A scratchy feeling in your  eye.  Tearing and redness of your eye.  Pus draining from the stye.  How is this diagnosed? Your health care provider may be able to diagnose a stye just by examining your eye. The health care provider may also check to make sure:  You do not have a fever or other signs of a more serious infection.  The infection has not spread to other parts of your eye or areas around your eye.  How is this treated? Most styes will clear up in a few days without treatment. In some cases, you may need to use antibiotic drops or ointment to prevent infection. Your health care provider may have to drain the stye surgically if your stye is:  Large.  Causing a lot of pain.  Interfering with your vision.  This can be done using a thin blade or a needle. Follow these instructions at home:  Take medicines only as directed by your health care provider.  Apply a clean, warm compress to your eye for 10 minutes, 4 times a day.  Do not wear contact lenses or eye makeup until your stye has healed.  Do not try to pop or drain the stye. Contact a health care provider if:  You have chills or a fever.  Your stye does not go away after several days.  Your stye affects your vision.  Your eyeball becomes swollen, red, or painful. This information is not intended to replace advice given to you by your health   care provider. Make sure you discuss any questions you have with your health care provider. Document Released: 08/05/2005 Document Revised: 06/21/2016 Document Reviewed: 02/09/2014 Elsevier Interactive Patient Education  2018 Elsevier Inc.  

## 2017-06-14 DIAGNOSIS — F4310 Post-traumatic stress disorder, unspecified: Secondary | ICD-10-CM | POA: Diagnosis not present

## 2017-06-21 DIAGNOSIS — F4310 Post-traumatic stress disorder, unspecified: Secondary | ICD-10-CM | POA: Diagnosis not present

## 2017-06-23 DIAGNOSIS — E538 Deficiency of other specified B group vitamins: Secondary | ICD-10-CM | POA: Diagnosis not present

## 2017-06-28 DIAGNOSIS — F4310 Post-traumatic stress disorder, unspecified: Secondary | ICD-10-CM | POA: Diagnosis not present

## 2017-07-05 DIAGNOSIS — F4310 Post-traumatic stress disorder, unspecified: Secondary | ICD-10-CM | POA: Diagnosis not present

## 2017-07-13 DIAGNOSIS — M0579 Rheumatoid arthritis with rheumatoid factor of multiple sites without organ or systems involvement: Secondary | ICD-10-CM | POA: Diagnosis not present

## 2017-07-16 DIAGNOSIS — F4310 Post-traumatic stress disorder, unspecified: Secondary | ICD-10-CM | POA: Diagnosis not present

## 2017-07-21 DIAGNOSIS — E538 Deficiency of other specified B group vitamins: Secondary | ICD-10-CM | POA: Diagnosis not present

## 2017-07-21 DIAGNOSIS — F520 Hypoactive sexual desire disorder: Secondary | ICD-10-CM | POA: Diagnosis not present

## 2017-08-13 DIAGNOSIS — E063 Autoimmune thyroiditis: Secondary | ICD-10-CM | POA: Diagnosis not present

## 2017-08-13 DIAGNOSIS — E038 Other specified hypothyroidism: Secondary | ICD-10-CM | POA: Diagnosis not present

## 2017-08-16 DIAGNOSIS — E038 Other specified hypothyroidism: Secondary | ICD-10-CM | POA: Diagnosis not present

## 2017-08-16 DIAGNOSIS — E063 Autoimmune thyroiditis: Secondary | ICD-10-CM | POA: Diagnosis not present

## 2017-08-18 DIAGNOSIS — E538 Deficiency of other specified B group vitamins: Secondary | ICD-10-CM | POA: Diagnosis not present

## 2017-08-18 DIAGNOSIS — F520 Hypoactive sexual desire disorder: Secondary | ICD-10-CM | POA: Diagnosis not present

## 2017-08-24 DIAGNOSIS — L821 Other seborrheic keratosis: Secondary | ICD-10-CM | POA: Diagnosis not present

## 2017-08-24 DIAGNOSIS — Z23 Encounter for immunization: Secondary | ICD-10-CM | POA: Diagnosis not present

## 2017-08-24 DIAGNOSIS — Z85828 Personal history of other malignant neoplasm of skin: Secondary | ICD-10-CM | POA: Diagnosis not present

## 2017-08-24 DIAGNOSIS — L570 Actinic keratosis: Secondary | ICD-10-CM | POA: Diagnosis not present

## 2017-08-24 DIAGNOSIS — D1801 Hemangioma of skin and subcutaneous tissue: Secondary | ICD-10-CM | POA: Diagnosis not present

## 2017-09-15 DIAGNOSIS — E538 Deficiency of other specified B group vitamins: Secondary | ICD-10-CM | POA: Diagnosis not present

## 2017-09-15 DIAGNOSIS — F520 Hypoactive sexual desire disorder: Secondary | ICD-10-CM | POA: Diagnosis not present

## 2017-09-21 DIAGNOSIS — M2550 Pain in unspecified joint: Secondary | ICD-10-CM | POA: Diagnosis not present

## 2017-09-21 DIAGNOSIS — Z79899 Other long term (current) drug therapy: Secondary | ICD-10-CM | POA: Diagnosis not present

## 2017-09-21 DIAGNOSIS — M0579 Rheumatoid arthritis with rheumatoid factor of multiple sites without organ or systems involvement: Secondary | ICD-10-CM | POA: Diagnosis not present

## 2017-10-13 DIAGNOSIS — E538 Deficiency of other specified B group vitamins: Secondary | ICD-10-CM | POA: Diagnosis not present

## 2017-10-13 DIAGNOSIS — F520 Hypoactive sexual desire disorder: Secondary | ICD-10-CM | POA: Diagnosis not present

## 2017-11-05 ENCOUNTER — Other Ambulatory Visit: Payer: Self-pay

## 2017-11-05 ENCOUNTER — Encounter: Payer: Self-pay | Admitting: Physician Assistant

## 2017-11-05 ENCOUNTER — Ambulatory Visit: Payer: BLUE CROSS/BLUE SHIELD | Admitting: Physician Assistant

## 2017-11-05 VITALS — BP 100/60 | HR 73 | Temp 98.0°F | Resp 16 | Ht 63.0 in | Wt 126.6 lb

## 2017-11-05 DIAGNOSIS — J0100 Acute maxillary sinusitis, unspecified: Secondary | ICD-10-CM | POA: Diagnosis not present

## 2017-11-05 DIAGNOSIS — B379 Candidiasis, unspecified: Secondary | ICD-10-CM

## 2017-11-05 DIAGNOSIS — T3695XA Adverse effect of unspecified systemic antibiotic, initial encounter: Secondary | ICD-10-CM

## 2017-11-05 DIAGNOSIS — J3489 Other specified disorders of nose and nasal sinuses: Secondary | ICD-10-CM | POA: Diagnosis not present

## 2017-11-05 MED ORDER — FLUCONAZOLE 150 MG PO TABS: 150.0000 mg | ORAL_TABLET | Freq: Once | ORAL | 0 refills | Status: AC

## 2017-11-05 MED ORDER — AMOXICILLIN-POT CLAVULANATE 875-125 MG PO TABS: 1.0000 | ORAL_TABLET | Freq: Two times a day (BID) | ORAL | 0 refills | Status: AC

## 2017-11-05 NOTE — Progress Notes (Signed)
MRN: [id number] DOB: 02/01/1961  Subjective:   Tina Olsen is a 56 y.o. female presenting for chief complaint of sinus pain/pressure (since before Thanksgiving, hx of chronic sinus infections, intermittent but is now back, green mucus drainage.  Per pt she will be leaving the country to go to [place] and wants to be checked out before leaving.) .  Reports one month history of on and off sinus headache, sinus congestion, sinus pain, rhinorrhea and ear fullness. Notes the last time it returned it has been worse. Can feel the drainage shift in her sinus cavities.  She is about to leave for [place] for 1 month and wants to get this taken care of of her trip.  Has tried nasal saline rinses, flonase, zyrtec, and NSAIDs with no full relief. Denies fever, ear pain, sore throat, shortness of breath, chest tightness, chest pain, myalgia and cough, nausea, vomiting, abdominal pain, diarrhea, blurred vision, and confusion. Has not had sick contact with anyone. Has history of seasonal allergies,no history of asthma. Patient has had flu shot this season. Denies smoking. Has hx of sinus infections. Denies any other aggravating or relieving factors, no other questions or concerns.  Tina Olsen has a current medication list which includes the following prescription(s): acetaminophen, cetirizine, diclofenac, fluticasone, levothyroxine, and calcium-vitamin d. Also is allergic to sulfa antibiotics.  Tina Olsen  has a past medical history of Allergy, Arthritis, and Thyroid disease. Also  has a past surgical history that includes Sinus surgery with Instatrak (2003); Abdominal hysterectomy; and Breast biopsy (Left, 2017).   Objective:   Vitals: BP 100/60 (BP Location: Left Arm, Patient Position: Sitting, Cuff Size: Normal)   Pulse 73   Temp 98 F (36.7 C) (Oral)   Resp 16   Ht 5\' 3"  (1.6 m)   Wt 126 lb 9.6 oz (57.4 kg)   SpO2 98%   BMI 22.43 kg/m   Physical Exam  Constitutional: She is oriented to  person, place, and time. She appears well-developed and well-nourished. No distress.  HENT:  Head: Normocephalic and atraumatic.  Right Ear: External ear and ear canal normal. Tympanic membrane is not erythematous and not bulging. A middle ear effusion is present.  Left Ear: External ear and ear canal normal. Tympanic membrane is not erythematous and not bulging. A middle ear effusion is present.  Nose: Mucosal edema present. Right sinus exhibits maxillary sinus tenderness (moderate). Right sinus exhibits no frontal sinus tenderness. Left sinus exhibits maxillary sinus tenderness (moderate). Left sinus exhibits no frontal sinus tenderness.  Mouth/Throat: Uvula is midline, oropharynx is clear and moist and mucous membranes are normal. No tonsillar exudate.  Eyes: Conjunctivae and EOM are normal. Pupils are equal, round, and reactive to light.  Neck: Normal range of motion.  Cardiovascular: Normal rate, regular rhythm and normal heart sounds.  Pulmonary/Chest: Effort normal and breath sounds normal. She has no wheezes. She has no rhonchi. She has no rales.  Lymphadenopathy:       Head (right side): No submental, no submandibular, no tonsillar, no preauricular, no posterior auricular and no occipital adenopathy present.       Head (left side): No submental, no submandibular, no tonsillar, no preauricular, no posterior auricular and no occipital adenopathy present.    She has no cervical adenopathy.       Right: No supraclavicular adenopathy present.       Left: No supraclavicular adenopathy present.  Neurological: She is alert and oriented to person, place, and time. No cranial nerve deficit.  Skin:  Skin is warm and dry.  Psychiatric: She has a normal mood and affect.  Vitals reviewed.   No results found for this or any previous visit (from the past 24 hour(s)).  Assessment and Plan :  1. Sinus pressure 2. Acute maxillary sinusitis, recurrence not specified Hx and PE findings consistent with  sinusitis. Due to duration of and worsening symptoms with no improvement with conservative treatment, will  treat with antibiotics at this time.  Patient encouraged to continue nasal saline rinses and Zyrtec. Advised to return to clinic if symptoms worsen, do not improve, or as needed. - amoxicillin-clavulanate (AUGMENTIN) 875-125 MG tablet; Take 1 tablet by mouth 2 (two) times daily for 7 days.  Dispense: 14 tablet; Refill: 0  3. Antibiotic-induced yeast infection - fluconazole (DIFLUCAN) 150 MG tablet; Take 1 tablet (150 mg total) by mouth once for 1 dose. Repeat if needed  Dispense: 2 tablet; Refill: 0  Tenna Delaine, PA-C  [facility] [place] 11/05/2017 9:20 AM

## 2017-11-05 NOTE — Patient Instructions (Addendum)
Continue doing saline rinses and zyrtec. Have a safe flight and an amazing trip. Thank you for letting me participate in your health and well being.    Sinusitis, Adult Sinusitis is soreness and inflammation of your sinuses. Sinuses are hollow spaces in the bones around your face. They are located:  Around your eyes.  In the middle of your forehead.  Behind your nose.  In your cheekbones.  Your sinuses and nasal passages are lined with a stringy fluid (mucus). Mucus normally drains out of your sinuses. When your nasal tissues get inflamed or swollen, the mucus can get trapped or blocked so air cannot flow through your sinuses. This lets bacteria, viruses, and funguses grow, and that leads to infection. Follow these instructions at home: Medicines  Take, use, or apply over-the-counter and prescription medicines only as told by your doctor. These may include nasal sprays.  If you were prescribed an antibiotic medicine, take it as told by your doctor. Do not stop taking the antibiotic even if you start to feel better. Hydrate and Humidify  Drink enough water to keep your pee (urine) clear or pale yellow.  Use a cool mist humidifier to keep the humidity level in your home above 50%.  Breathe in steam for 10-15 minutes, 3-4 times a day or as told by your doctor. You can do this in the bathroom while a hot shower is running.  Try not to spend time in cool or dry air. Rest  Rest as much as possible.  Sleep with your head raised (elevated).  Make sure to get enough sleep each night. General instructions  Put a warm, moist washcloth on your face 3-4 times a day or as told by your doctor. This will help with discomfort.  Wash your hands often with soap and water. If there is no soap and water, use hand sanitizer.  Do not smoke. Avoid being around people who are smoking (secondhand smoke).  Keep all follow-up visits as told by your doctor. This is important. Contact a doctor  if:  You have a fever.  Your symptoms get worse.  Your symptoms do not get better within 10 days. Get help right away if:  You have a very bad headache.  You cannot stop throwing up (vomiting).  You have pain or swelling around your face or eyes.  You have trouble seeing.  You feel confused.  Your neck is stiff.  You have trouble breathing. This information is not intended to replace advice given to you by your health care provider. Make sure you discuss any questions you have with your health care provider. Document Released: 04/13/2008 Document Revised: 06/21/2016 Document Reviewed: 08/21/2015 Elsevier Interactive Patient Education  2018 [employer].   IF you received an x-ray today, you will receive an invoice from [facility]. Please contact [facility] at [phone] with questions or concerns regarding your invoice.   IF you received labwork today, you will receive an invoice from [place]. Please contact [facility] at [phone] with questions or concerns regarding your invoice.   Our billing staff will not be able to assist you with questions regarding bills from these companies.  You will be contacted with the lab results as soon as they are available. The fastest way to get your results is to activate your My Chart account. Instructions are located on the last page of this paperwork. If you have not heard from [place] regarding the results in 2 weeks, please contact this office.

## 2017-11-08 DIAGNOSIS — F520 Hypoactive sexual desire disorder: Secondary | ICD-10-CM | POA: Diagnosis not present

## 2017-11-08 DIAGNOSIS — E538 Deficiency of other specified B group vitamins: Secondary | ICD-10-CM | POA: Diagnosis not present

## 2017-12-06 DIAGNOSIS — F520 Hypoactive sexual desire disorder: Secondary | ICD-10-CM | POA: Diagnosis not present

## 2017-12-06 DIAGNOSIS — E538 Deficiency of other specified B group vitamins: Secondary | ICD-10-CM | POA: Diagnosis not present

## 2018-01-05 DIAGNOSIS — F520 Hypoactive sexual desire disorder: Secondary | ICD-10-CM | POA: Diagnosis not present

## 2018-01-05 DIAGNOSIS — E538 Deficiency of other specified B group vitamins: Secondary | ICD-10-CM | POA: Diagnosis not present

## 2018-01-07 ENCOUNTER — Other Ambulatory Visit: Payer: Self-pay

## 2018-01-07 ENCOUNTER — Ambulatory Visit: Payer: BLUE CROSS/BLUE SHIELD | Admitting: Physician Assistant

## 2018-01-07 ENCOUNTER — Encounter: Payer: Self-pay | Admitting: Physician Assistant

## 2018-01-07 VITALS — BP 102/60 | HR 73 | Temp 98.6°F | Resp 18 | Ht 63.0 in | Wt 131.4 lb

## 2018-01-07 DIAGNOSIS — J302 Other seasonal allergic rhinitis: Secondary | ICD-10-CM

## 2018-01-07 DIAGNOSIS — J0100 Acute maxillary sinusitis, unspecified: Secondary | ICD-10-CM | POA: Diagnosis not present

## 2018-01-07 DIAGNOSIS — M069 Rheumatoid arthritis, unspecified: Secondary | ICD-10-CM | POA: Insufficient documentation

## 2018-01-07 MED ORDER — GUAIFENESIN ER 1200 MG PO TB12: 1.0000 | ORAL_TABLET | Freq: Two times a day (BID) | ORAL | 0 refills | Status: AC

## 2018-01-07 MED ORDER — FLUCONAZOLE 150 MG PO TABS: 150.0000 mg | ORAL_TABLET | Freq: Once | ORAL | 0 refills | Status: AC

## 2018-01-07 MED ORDER — AMOXICILLIN 875 MG PO TABS: 875.0000 mg | ORAL_TABLET | Freq: Two times a day (BID) | ORAL | 0 refills | Status: DC

## 2018-01-07 NOTE — Patient Instructions (Signed)
     IF you received an x-ray today, you will receive an invoice from [facility]. Please contact [facility] at [phone] with questions or concerns regarding your invoice.   IF you received labwork today, you will receive an invoice from [facility]. Please contact [facility] at [phone] with questions or concerns regarding your invoice.   Our billing staff will not be able to assist you with questions regarding bills from these companies.  You will be contacted with the lab results as soon as they are available. The fastest way to get your results is to activate your My Chart account. Instructions are located on the last page of this paperwork. If you have not heard from us regarding the results in 2 weeks, please contact this office.     

## 2018-01-07 NOTE — Progress Notes (Addendum)
Tina Olsen  MRN: [id number] DOB: 08/31/1961  PCP: Forrest Moron, MD  Chief Complaint  Patient presents with  . Sinus Problem    facial pain, nasal congestion with yellow mucus x3days     Subjective:  Pt presents to clinic for left sided facial pain that kept her up last night from the pain. She tried advil and tylenol last night but it did not help.  She has teeth pain on the left upper teeth.  She has daily chronic allergies which she takes medications for.  She has not had a cold or anything like that recently.  Pt has h/o sinus surgery in the past.  She thinks that the sinus infections she has gotten in the past have been related to her allergies.  She had allergy testing in [place] but none since she moved to [facility].  History is obtained by patient.  Review of Systems  Constitutional: Negative for chills and fever.  HENT: Positive for congestion, rhinorrhea (yellow) and sinus pressure (left maxillary).   Respiratory: Negative for cough.   Gastrointestinal: Negative for diarrhea, nausea and vomiting.  Neurological: Positive for headaches.  Psychiatric/Behavioral: Positive for sleep disturbance.    Patient Active Problem List   Diagnosis Date Noted  . Rheumatoid arthritis ([place]) 01/07/2018  . Allergic rhinitis due to allergen 01/22/2017  . Elevated TSH 02/13/2016    Current Outpatient Medications on File Prior to Visit  Medication Sig Dispense Refill  . Acetaminophen (TYLENOL PO) Take by mouth.    . calcium-vitamin D (OSCAL WITH D) 500-200 MG-UNIT per tablet Take 3 tablets by mouth daily.    . cetirizine (ZYRTEC) 10 MG tablet Take 10 mg by mouth daily.    Marland Kitchen conjugated estrogens (PREMARIN) vaginal cream Place vaginally.    Marland Kitchen DICLOFENAC PO Take 75 mg by mouth 2 (two) times daily.     . fluticasone (FLONASE) 50 MCG/ACT nasal spray Place into both nostrils daily.    Marland Kitchen levothyroxine (SYNTHROID, LEVOTHROID) 75 MCG tablet Take 75 mcg by mouth daily before breakfast.    .  testosterone cypionate (DEPOTESTOSTERONE CYPIONATE) 200 MG/ML injection Bring to office for IM injection    . Biotin 5 MG CAPS Take by mouth.    . cyanocobalamin (,VITAMIN B-12,) 1000 MCG/ML injection   6  . diclofenac (VOLTAREN) 75 MG EC tablet Take by mouth.    . folic acid (FOLVITE) 536 MCG tablet Take by mouth.    . magnesium oxide (MAG-OX) 400 MG tablet Take by mouth.     No current facility-administered medications on file prior to visit.     Allergies  Allergen Reactions  . Sulfa Antibiotics Rash    Past Medical History:  Diagnosis Date  . Allergy   . Arthritis   . Thyroid disease    Social History   Social History Narrative  . Not on file   Social History   Tobacco Use  . Smoking status: Former Smoker    Last attempt to quit: 05/09/2002    Years since quitting: 15.6  . Smokeless tobacco: Never Used  Substance Use Topics  . Alcohol use: No  . Drug use: No   family history includes Colon cancer in her paternal grandfather.     Objective:  BP 102/60   Pulse 73   Temp 98.6 F (37 C) (Oral)   Resp 18   Ht 5\' 3"  (1.6 m)   Wt 131 lb 6.4 oz (59.6 kg)   SpO2 99%   BMI  23.28 kg/m  Body mass index is 23.28 kg/m.  Physical Exam  Constitutional: She is oriented to person, place, and time and well-developed, well-nourished, and in no distress.  HENT:  Head: Normocephalic and atraumatic.  Right Ear: Hearing, tympanic membrane, external ear and ear canal normal.  Left Ear: Hearing, tympanic membrane, external ear and ear canal normal.  Nose: Nose normal.  Mouth/Throat: Uvula is midline, oropharynx is clear and moist and mucous membranes are normal.  Eyes: Conjunctivae are normal.  Neck: Normal range of motion.  Cardiovascular: Normal rate and regular rhythm.  Pulmonary/Chest: Effort normal.  Neurological: She is alert and oriented to person, place, and time. Gait normal.  Skin: Skin is warm and dry.  Psychiatric: Mood, memory, affect and judgment normal.    Vitals reviewed.   Assessment and Plan :  Acute non-recurrent maxillary sinusitis - Plan: amoxicillin (AMOXIL) 875 MG tablet, fluconazole (DIFLUCAN) 150 MG tablet, Guaifenesin (MUCINEX MAXIMUM STRENGTH) 1200 MG TB12  Seasonal allergies - continue current allergy medications  Increase probiotics and yogurt during her course of abx to help prevent worsening yeast infections.  Windell Hummingbird PA-C  [facility] 01/07/2018 9:11 AM

## 2018-02-01 IMAGING — MG MM DIGITAL SCREENING BILAT W/ CAD
4 series · 4 of 4 positions shown · non-contrast
Comparison: Previous exam(s).

CLINICAL DATA: Screening.

EXAM:
DIGITAL SCREENING BILATERAL MAMMOGRAM WITH CAD

[L MLO]
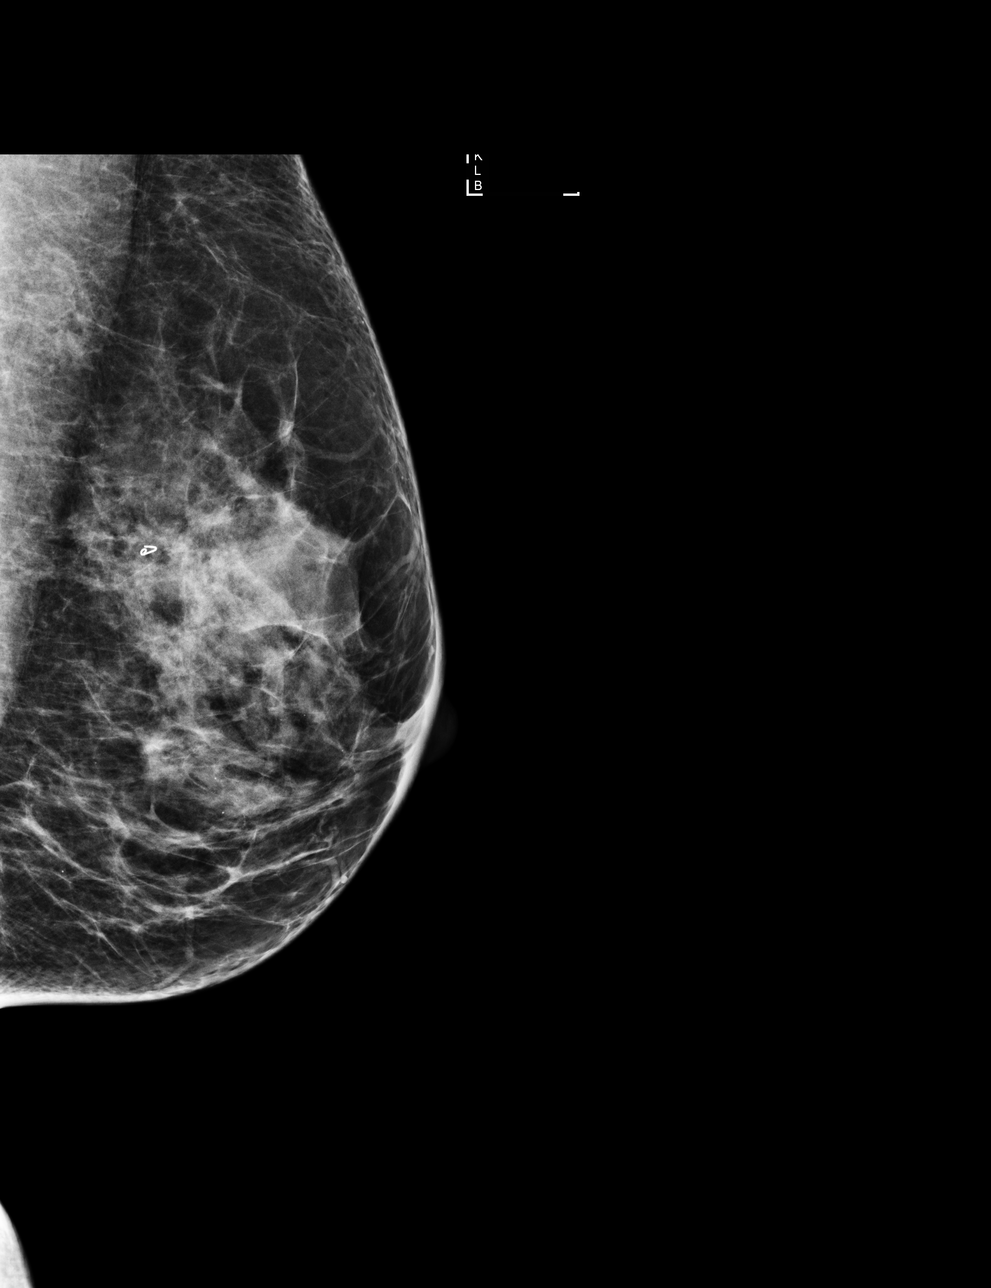

[R CC]
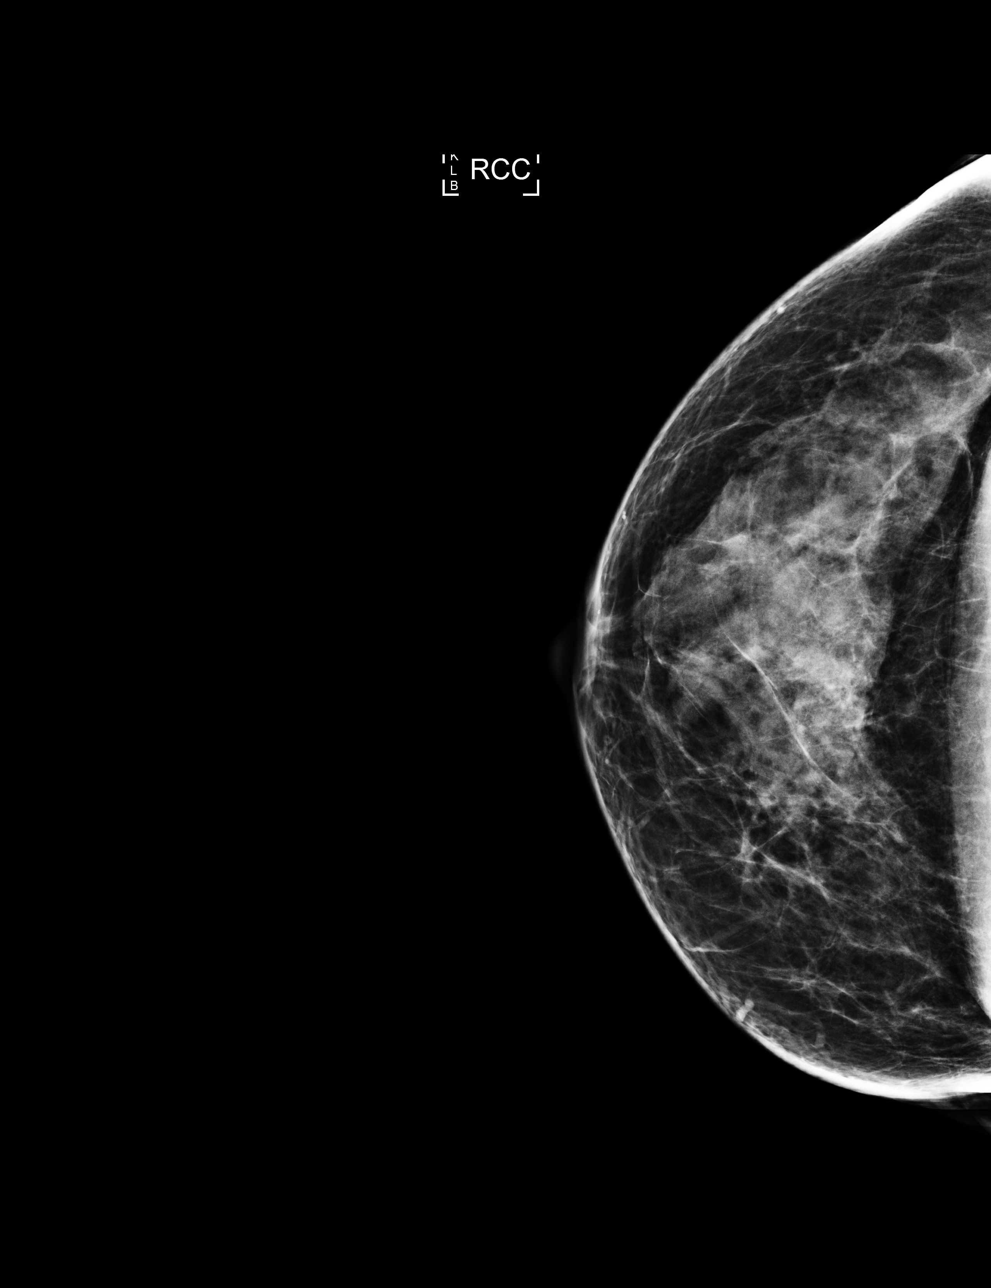

[R MLO]
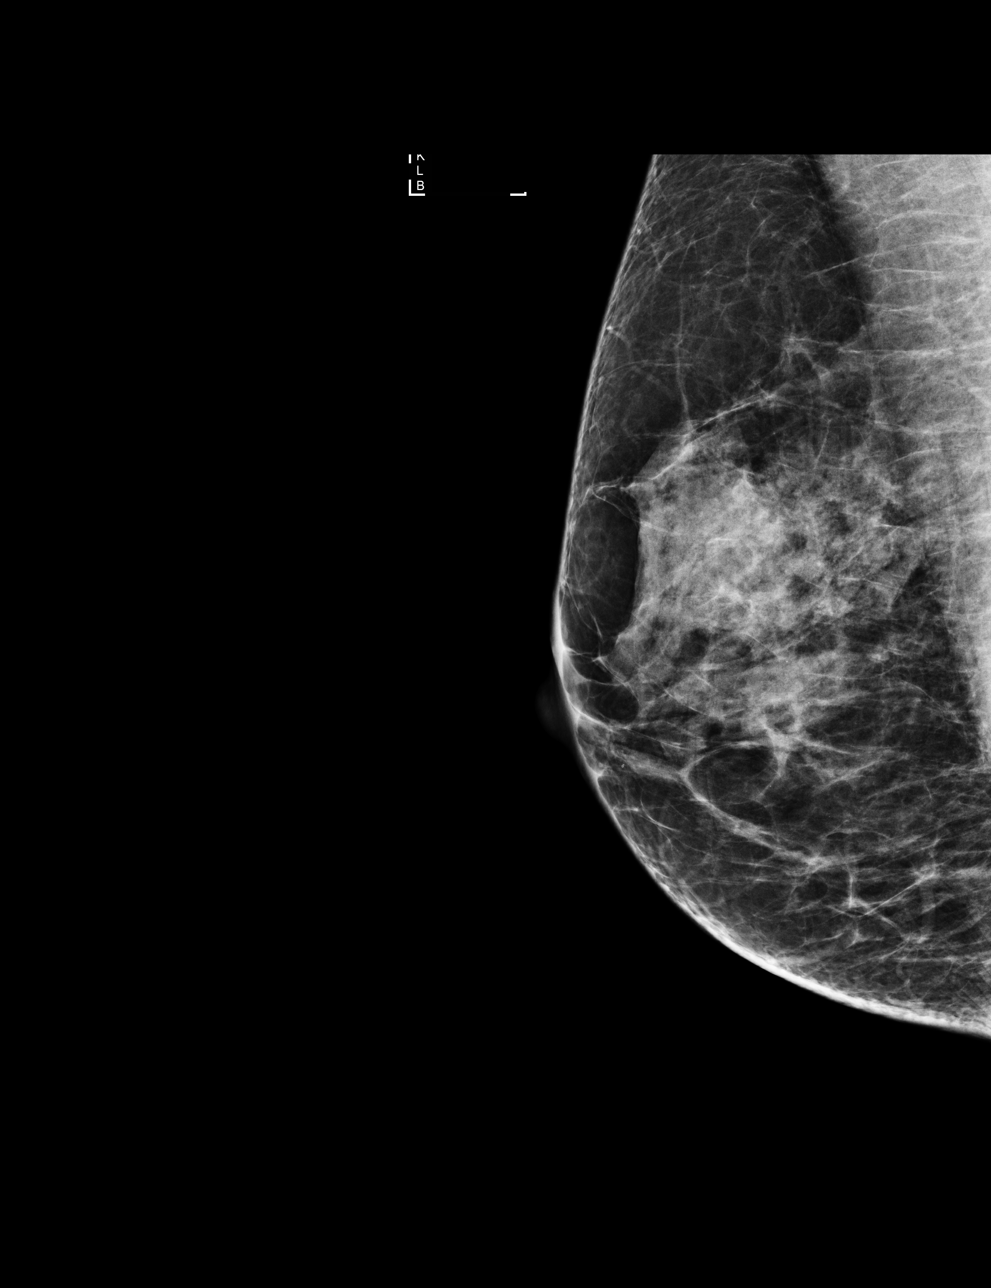

[L CC]
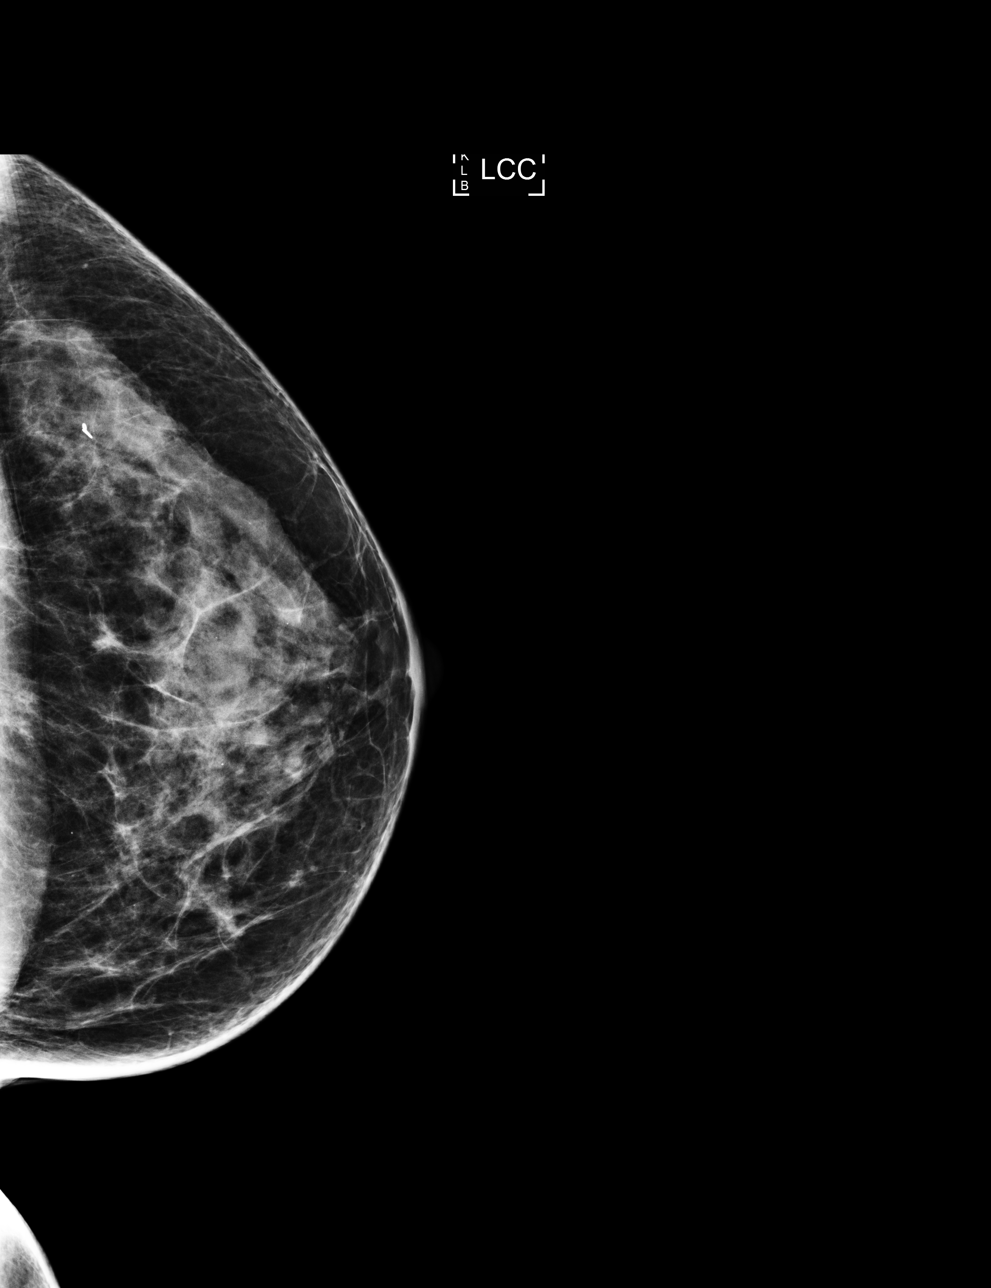

[4 of 4 positions shown; findings below may reference images not displayed]

ACR Breast Density Category c: The breast tissue is heterogeneously
dense, which may obscure small masses.
FINDINGS: There are no findings suspicious for malignancy. Images were
processed with CAD.
IMPRESSION: No mammographic evidence of malignancy. A result letter of this
screening mammogram will be mailed directly to the patient.

RECOMMENDATION:
Screening mammogram in one year. (Code:YJ-[id number])

BI-RADS CATEGORY  1: Negative.

## 2018-02-09 DIAGNOSIS — F520 Hypoactive sexual desire disorder: Secondary | ICD-10-CM | POA: Diagnosis not present

## 2018-02-09 DIAGNOSIS — E538 Deficiency of other specified B group vitamins: Secondary | ICD-10-CM | POA: Diagnosis not present

## 2018-02-15 ENCOUNTER — Other Ambulatory Visit: Payer: Self-pay | Admitting: Obstetrics and Gynecology

## 2018-02-15 DIAGNOSIS — A6000 Herpesviral infection of urogenital system, unspecified: Secondary | ICD-10-CM | POA: Diagnosis not present

## 2018-02-15 DIAGNOSIS — Z1231 Encounter for screening mammogram for malignant neoplasm of breast: Secondary | ICD-10-CM | POA: Diagnosis not present

## 2018-02-15 DIAGNOSIS — Z01419 Encounter for gynecological examination (general) (routine) without abnormal findings: Secondary | ICD-10-CM | POA: Diagnosis not present

## 2018-02-15 DIAGNOSIS — Z01411 Encounter for gynecological examination (general) (routine) with abnormal findings: Secondary | ICD-10-CM | POA: Diagnosis not present

## 2018-02-15 DIAGNOSIS — E063 Autoimmune thyroiditis: Secondary | ICD-10-CM | POA: Diagnosis not present

## 2018-02-15 DIAGNOSIS — E038 Other specified hypothyroidism: Secondary | ICD-10-CM | POA: Diagnosis not present

## 2018-02-15 DIAGNOSIS — Z1211 Encounter for screening for malignant neoplasm of colon: Secondary | ICD-10-CM | POA: Diagnosis not present

## 2018-03-09 ENCOUNTER — Ambulatory Visit
Admission: RE | Admit: 2018-03-09 | Discharge: 2018-03-09 | Disposition: A | Discharge status: Home / Self Care | Payer: BLUE CROSS/BLUE SHIELD | Source: Ambulatory Visit | Attending: Obstetrics and Gynecology | Admitting: Obstetrics and Gynecology

## 2018-03-09 DIAGNOSIS — Z1231 Encounter for screening mammogram for malignant neoplasm of breast: Secondary | ICD-10-CM

## 2018-03-10 DIAGNOSIS — F520 Hypoactive sexual desire disorder: Secondary | ICD-10-CM | POA: Diagnosis not present

## 2018-03-10 DIAGNOSIS — E538 Deficiency of other specified B group vitamins: Secondary | ICD-10-CM | POA: Diagnosis not present

## 2018-05-09 DIAGNOSIS — F520 Hypoactive sexual desire disorder: Secondary | ICD-10-CM | POA: Diagnosis not present

## 2018-05-09 DIAGNOSIS — E539 Vitamin B deficiency, unspecified: Secondary | ICD-10-CM | POA: Diagnosis not present

## 2018-06-06 DIAGNOSIS — E538 Deficiency of other specified B group vitamins: Secondary | ICD-10-CM | POA: Diagnosis not present

## 2018-06-06 DIAGNOSIS — F520 Hypoactive sexual desire disorder: Secondary | ICD-10-CM | POA: Diagnosis not present

## 2018-06-13 DIAGNOSIS — M2550 Pain in unspecified joint: Secondary | ICD-10-CM | POA: Diagnosis not present

## 2018-06-13 DIAGNOSIS — M0579 Rheumatoid arthritis with rheumatoid factor of multiple sites without organ or systems involvement: Secondary | ICD-10-CM | POA: Diagnosis not present

## 2018-06-13 DIAGNOSIS — Z79899 Other long term (current) drug therapy: Secondary | ICD-10-CM | POA: Diagnosis not present

## 2018-06-29 DIAGNOSIS — M79644 Pain in right finger(s): Secondary | ICD-10-CM | POA: Diagnosis not present

## 2018-06-29 DIAGNOSIS — R200 Anesthesia of skin: Secondary | ICD-10-CM | POA: Diagnosis not present

## 2018-06-29 DIAGNOSIS — M21612 Bunion of left foot: Secondary | ICD-10-CM | POA: Diagnosis not present

## 2018-07-04 DIAGNOSIS — Z8639 Personal history of other endocrine, nutritional and metabolic disease: Secondary | ICD-10-CM | POA: Diagnosis not present

## 2018-07-04 DIAGNOSIS — F520 Hypoactive sexual desire disorder: Secondary | ICD-10-CM | POA: Diagnosis not present

## 2018-07-15 DIAGNOSIS — H40013 Open angle with borderline findings, low risk, bilateral: Secondary | ICD-10-CM | POA: Diagnosis not present

## 2018-07-15 DIAGNOSIS — H5310 Unspecified subjective visual disturbances: Secondary | ICD-10-CM | POA: Diagnosis not present

## 2018-07-15 DIAGNOSIS — H04123 Dry eye syndrome of bilateral lacrimal glands: Secondary | ICD-10-CM | POA: Diagnosis not present

## 2018-07-15 DIAGNOSIS — H25013 Cortical age-related cataract, bilateral: Secondary | ICD-10-CM | POA: Diagnosis not present

## 2018-07-28 DIAGNOSIS — M4722 Other spondylosis with radiculopathy, cervical region: Secondary | ICD-10-CM | POA: Diagnosis not present

## 2018-07-31 DIAGNOSIS — M8580 Other specified disorders of bone density and structure, unspecified site: Secondary | ICD-10-CM | POA: Diagnosis not present

## 2018-07-31 DIAGNOSIS — S63287A Dislocation of proximal interphalangeal joint of left little finger, initial encounter: Secondary | ICD-10-CM | POA: Diagnosis not present

## 2018-07-31 DIAGNOSIS — S63283A Dislocation of proximal interphalangeal joint of left middle finger, initial encounter: Secondary | ICD-10-CM | POA: Diagnosis not present

## 2018-07-31 DIAGNOSIS — S63285A Dislocation of proximal interphalangeal joint of left ring finger, initial encounter: Secondary | ICD-10-CM | POA: Diagnosis not present

## 2018-07-31 DIAGNOSIS — S6992XA Unspecified injury of left wrist, hand and finger(s), initial encounter: Secondary | ICD-10-CM | POA: Diagnosis not present

## 2018-07-31 DIAGNOSIS — S60512A Abrasion of left hand, initial encounter: Secondary | ICD-10-CM | POA: Diagnosis not present

## 2018-07-31 DIAGNOSIS — M25562 Pain in left knee: Secondary | ICD-10-CM | POA: Diagnosis not present

## 2018-07-31 DIAGNOSIS — S63282A Dislocation of proximal interphalangeal joint of right middle finger, initial encounter: Secondary | ICD-10-CM | POA: Diagnosis not present

## 2018-07-31 DIAGNOSIS — Z9889 Other specified postprocedural states: Secondary | ICD-10-CM | POA: Diagnosis not present

## 2018-07-31 DIAGNOSIS — M79642 Pain in left hand: Secondary | ICD-10-CM | POA: Diagnosis not present

## 2018-07-31 DIAGNOSIS — M069 Rheumatoid arthritis, unspecified: Secondary | ICD-10-CM | POA: Diagnosis not present

## 2018-07-31 DIAGNOSIS — S8992XA Unspecified injury of left lower leg, initial encounter: Secondary | ICD-10-CM | POA: Diagnosis not present

## 2018-07-31 DIAGNOSIS — E039 Hypothyroidism, unspecified: Secondary | ICD-10-CM | POA: Diagnosis not present

## 2018-08-01 DIAGNOSIS — S63633A Sprain of interphalangeal joint of left middle finger, initial encounter: Secondary | ICD-10-CM | POA: Diagnosis not present

## 2018-08-01 DIAGNOSIS — S63635A Sprain of interphalangeal joint of left ring finger, initial encounter: Secondary | ICD-10-CM | POA: Diagnosis not present

## 2018-08-01 DIAGNOSIS — S63637A Sprain of interphalangeal joint of left little finger, initial encounter: Secondary | ICD-10-CM | POA: Diagnosis not present

## 2018-08-03 DIAGNOSIS — F520 Hypoactive sexual desire disorder: Secondary | ICD-10-CM | POA: Diagnosis not present

## 2018-08-05 DIAGNOSIS — E538 Deficiency of other specified B group vitamins: Secondary | ICD-10-CM | POA: Diagnosis not present

## 2018-08-10 DIAGNOSIS — E063 Autoimmune thyroiditis: Secondary | ICD-10-CM | POA: Diagnosis not present

## 2018-08-10 DIAGNOSIS — E038 Other specified hypothyroidism: Secondary | ICD-10-CM | POA: Diagnosis not present

## 2018-08-17 DIAGNOSIS — E038 Other specified hypothyroidism: Secondary | ICD-10-CM | POA: Diagnosis not present

## 2018-08-17 DIAGNOSIS — E063 Autoimmune thyroiditis: Secondary | ICD-10-CM | POA: Diagnosis not present

## 2018-08-23 DIAGNOSIS — S63633A Sprain of interphalangeal joint of left middle finger, initial encounter: Secondary | ICD-10-CM | POA: Diagnosis not present

## 2018-08-23 DIAGNOSIS — S63635A Sprain of interphalangeal joint of left ring finger, initial encounter: Secondary | ICD-10-CM | POA: Diagnosis not present

## 2018-08-23 DIAGNOSIS — S63637A Sprain of interphalangeal joint of left little finger, initial encounter: Secondary | ICD-10-CM | POA: Diagnosis not present

## 2018-08-30 DIAGNOSIS — D225 Melanocytic nevi of trunk: Secondary | ICD-10-CM | POA: Diagnosis not present

## 2018-08-30 DIAGNOSIS — L814 Other melanin hyperpigmentation: Secondary | ICD-10-CM | POA: Diagnosis not present

## 2018-08-30 DIAGNOSIS — Z85828 Personal history of other malignant neoplasm of skin: Secondary | ICD-10-CM | POA: Diagnosis not present

## 2018-08-30 DIAGNOSIS — D485 Neoplasm of uncertain behavior of skin: Secondary | ICD-10-CM | POA: Diagnosis not present

## 2018-08-30 DIAGNOSIS — C4442 Squamous cell carcinoma of skin of scalp and neck: Secondary | ICD-10-CM | POA: Diagnosis not present

## 2018-08-30 DIAGNOSIS — L570 Actinic keratosis: Secondary | ICD-10-CM | POA: Diagnosis not present

## 2018-08-30 DIAGNOSIS — L821 Other seborrheic keratosis: Secondary | ICD-10-CM | POA: Diagnosis not present

## 2018-08-31 DIAGNOSIS — E538 Deficiency of other specified B group vitamins: Secondary | ICD-10-CM | POA: Diagnosis not present

## 2018-09-20 DIAGNOSIS — L570 Actinic keratosis: Secondary | ICD-10-CM | POA: Diagnosis not present

## 2018-09-27 ENCOUNTER — Telehealth: Payer: Self-pay

## 2018-09-27 DIAGNOSIS — S63635A Sprain of interphalangeal joint of left ring finger, initial encounter: Secondary | ICD-10-CM | POA: Diagnosis not present

## 2018-09-27 DIAGNOSIS — S63633A Sprain of interphalangeal joint of left middle finger, initial encounter: Secondary | ICD-10-CM | POA: Diagnosis not present

## 2018-09-27 NOTE — Telephone Encounter (Signed)
ROI fax to [facility] for records.

## 2018-09-28 DIAGNOSIS — E538 Deficiency of other specified B group vitamins: Secondary | ICD-10-CM | POA: Diagnosis not present

## 2018-09-28 DIAGNOSIS — F520 Hypoactive sexual desire disorder: Secondary | ICD-10-CM | POA: Diagnosis not present

## 2018-10-26 DIAGNOSIS — E538 Deficiency of other specified B group vitamins: Secondary | ICD-10-CM | POA: Diagnosis not present

## 2018-10-26 DIAGNOSIS — F520 Hypoactive sexual desire disorder: Secondary | ICD-10-CM | POA: Diagnosis not present

## 2018-11-23 DIAGNOSIS — E538 Deficiency of other specified B group vitamins: Secondary | ICD-10-CM | POA: Diagnosis not present

## 2018-11-23 DIAGNOSIS — F520 Hypoactive sexual desire disorder: Secondary | ICD-10-CM | POA: Diagnosis not present

## 2018-12-16 DIAGNOSIS — Z79899 Other long term (current) drug therapy: Secondary | ICD-10-CM | POA: Diagnosis not present

## 2018-12-16 DIAGNOSIS — M0579 Rheumatoid arthritis with rheumatoid factor of multiple sites without organ or systems involvement: Secondary | ICD-10-CM | POA: Diagnosis not present

## 2018-12-16 DIAGNOSIS — R768 Other specified abnormal immunological findings in serum: Secondary | ICD-10-CM | POA: Diagnosis not present

## 2018-12-16 DIAGNOSIS — M2550 Pain in unspecified joint: Secondary | ICD-10-CM | POA: Diagnosis not present

## 2018-12-21 DIAGNOSIS — F520 Hypoactive sexual desire disorder: Secondary | ICD-10-CM | POA: Diagnosis not present

## 2018-12-21 DIAGNOSIS — E538 Deficiency of other specified B group vitamins: Secondary | ICD-10-CM | POA: Diagnosis not present

## 2019-01-04 DIAGNOSIS — M542 Cervicalgia: Secondary | ICD-10-CM | POA: Diagnosis not present

## 2019-01-06 DIAGNOSIS — M542 Cervicalgia: Secondary | ICD-10-CM | POA: Diagnosis not present

## 2019-01-10 DIAGNOSIS — M47812 Spondylosis without myelopathy or radiculopathy, cervical region: Secondary | ICD-10-CM | POA: Diagnosis not present

## 2019-01-18 DIAGNOSIS — E538 Deficiency of other specified B group vitamins: Secondary | ICD-10-CM | POA: Diagnosis not present

## 2019-01-18 DIAGNOSIS — F520 Hypoactive sexual desire disorder: Secondary | ICD-10-CM | POA: Diagnosis not present

## 2019-02-20 DIAGNOSIS — F520 Hypoactive sexual desire disorder: Secondary | ICD-10-CM | POA: Diagnosis not present

## 2019-02-20 DIAGNOSIS — E538 Deficiency of other specified B group vitamins: Secondary | ICD-10-CM | POA: Diagnosis not present

## 2019-03-14 DIAGNOSIS — M47812 Spondylosis without myelopathy or radiculopathy, cervical region: Secondary | ICD-10-CM | POA: Diagnosis not present

## 2019-03-20 DIAGNOSIS — F520 Hypoactive sexual desire disorder: Secondary | ICD-10-CM | POA: Diagnosis not present

## 2019-03-20 DIAGNOSIS — E538 Deficiency of other specified B group vitamins: Secondary | ICD-10-CM | POA: Diagnosis not present

## 2019-04-20 DIAGNOSIS — E538 Deficiency of other specified B group vitamins: Secondary | ICD-10-CM | POA: Diagnosis not present

## 2019-04-20 DIAGNOSIS — E063 Autoimmune thyroiditis: Secondary | ICD-10-CM | POA: Diagnosis not present

## 2019-04-20 DIAGNOSIS — F520 Hypoactive sexual desire disorder: Secondary | ICD-10-CM | POA: Diagnosis not present

## 2019-04-20 DIAGNOSIS — E038 Other specified hypothyroidism: Secondary | ICD-10-CM | POA: Diagnosis not present

## 2019-05-02 DIAGNOSIS — Z1151 Encounter for screening for human papillomavirus (HPV): Secondary | ICD-10-CM | POA: Diagnosis not present

## 2019-05-02 DIAGNOSIS — Z1211 Encounter for screening for malignant neoplasm of colon: Secondary | ICD-10-CM | POA: Diagnosis not present

## 2019-05-02 DIAGNOSIS — Z1239 Encounter for other screening for malignant neoplasm of breast: Secondary | ICD-10-CM | POA: Diagnosis not present

## 2019-05-02 DIAGNOSIS — Z01411 Encounter for gynecological examination (general) (routine) with abnormal findings: Secondary | ICD-10-CM | POA: Diagnosis not present

## 2019-05-22 DIAGNOSIS — F520 Hypoactive sexual desire disorder: Secondary | ICD-10-CM | POA: Diagnosis not present

## 2019-05-22 DIAGNOSIS — E538 Deficiency of other specified B group vitamins: Secondary | ICD-10-CM | POA: Diagnosis not present

## 2019-05-22 DIAGNOSIS — Z01411 Encounter for gynecological examination (general) (routine) with abnormal findings: Secondary | ICD-10-CM | POA: Diagnosis not present

## 2019-05-29 DIAGNOSIS — F520 Hypoactive sexual desire disorder: Secondary | ICD-10-CM | POA: Diagnosis not present

## 2019-06-14 DIAGNOSIS — L249 Irritant contact dermatitis, unspecified cause: Secondary | ICD-10-CM | POA: Diagnosis not present

## 2019-06-16 DIAGNOSIS — Z79899 Other long term (current) drug therapy: Secondary | ICD-10-CM | POA: Diagnosis not present

## 2019-06-16 DIAGNOSIS — M2550 Pain in unspecified joint: Secondary | ICD-10-CM | POA: Diagnosis not present

## 2019-06-16 DIAGNOSIS — M0579 Rheumatoid arthritis with rheumatoid factor of multiple sites without organ or systems involvement: Secondary | ICD-10-CM | POA: Diagnosis not present

## 2019-06-21 DIAGNOSIS — F5222 Female sexual arousal disorder: Secondary | ICD-10-CM | POA: Diagnosis not present

## 2019-06-21 DIAGNOSIS — E538 Deficiency of other specified B group vitamins: Secondary | ICD-10-CM | POA: Diagnosis not present

## 2019-06-22 DIAGNOSIS — E538 Deficiency of other specified B group vitamins: Secondary | ICD-10-CM | POA: Diagnosis not present

## 2019-06-22 DIAGNOSIS — F5222 Female sexual arousal disorder: Secondary | ICD-10-CM | POA: Diagnosis not present

## 2019-07-19 DIAGNOSIS — E538 Deficiency of other specified B group vitamins: Secondary | ICD-10-CM | POA: Diagnosis not present

## 2019-07-19 DIAGNOSIS — F5222 Female sexual arousal disorder: Secondary | ICD-10-CM | POA: Diagnosis not present

## 2019-07-21 DIAGNOSIS — H2513 Age-related nuclear cataract, bilateral: Secondary | ICD-10-CM | POA: Diagnosis not present

## 2019-07-21 DIAGNOSIS — H04123 Dry eye syndrome of bilateral lacrimal glands: Secondary | ICD-10-CM | POA: Diagnosis not present

## 2019-07-21 DIAGNOSIS — H40013 Open angle with borderline findings, low risk, bilateral: Secondary | ICD-10-CM | POA: Diagnosis not present

## 2019-07-21 DIAGNOSIS — H25013 Cortical age-related cataract, bilateral: Secondary | ICD-10-CM | POA: Diagnosis not present

## 2019-08-03 DIAGNOSIS — M47812 Spondylosis without myelopathy or radiculopathy, cervical region: Secondary | ICD-10-CM | POA: Diagnosis not present

## 2019-08-16 DIAGNOSIS — L821 Other seborrheic keratosis: Secondary | ICD-10-CM | POA: Diagnosis not present

## 2019-08-16 DIAGNOSIS — Z85828 Personal history of other malignant neoplasm of skin: Secondary | ICD-10-CM | POA: Diagnosis not present

## 2019-08-16 DIAGNOSIS — D044 Carcinoma in situ of skin of scalp and neck: Secondary | ICD-10-CM | POA: Diagnosis not present

## 2019-08-16 DIAGNOSIS — L570 Actinic keratosis: Secondary | ICD-10-CM | POA: Diagnosis not present

## 2019-08-16 DIAGNOSIS — L814 Other melanin hyperpigmentation: Secondary | ICD-10-CM | POA: Diagnosis not present

## 2019-08-21 DIAGNOSIS — F5222 Female sexual arousal disorder: Secondary | ICD-10-CM | POA: Diagnosis not present

## 2019-08-21 DIAGNOSIS — E538 Deficiency of other specified B group vitamins: Secondary | ICD-10-CM | POA: Diagnosis not present

## 2019-08-22 DIAGNOSIS — E063 Autoimmune thyroiditis: Secondary | ICD-10-CM | POA: Diagnosis not present

## 2019-08-22 DIAGNOSIS — E038 Other specified hypothyroidism: Secondary | ICD-10-CM | POA: Diagnosis not present

## 2019-08-23 DIAGNOSIS — E063 Autoimmune thyroiditis: Secondary | ICD-10-CM | POA: Diagnosis not present

## 2019-08-23 DIAGNOSIS — E038 Other specified hypothyroidism: Secondary | ICD-10-CM | POA: Diagnosis not present

## 2019-09-18 DIAGNOSIS — F5222 Female sexual arousal disorder: Secondary | ICD-10-CM | POA: Diagnosis not present

## 2019-09-18 DIAGNOSIS — E538 Deficiency of other specified B group vitamins: Secondary | ICD-10-CM | POA: Diagnosis not present

## 2019-10-16 DIAGNOSIS — F5222 Female sexual arousal disorder: Secondary | ICD-10-CM | POA: Diagnosis not present

## 2019-10-16 DIAGNOSIS — E538 Deficiency of other specified B group vitamins: Secondary | ICD-10-CM | POA: Diagnosis not present

## 2019-11-13 DIAGNOSIS — E538 Deficiency of other specified B group vitamins: Secondary | ICD-10-CM | POA: Diagnosis not present

## 2019-11-13 DIAGNOSIS — F5222 Female sexual arousal disorder: Secondary | ICD-10-CM | POA: Diagnosis not present

## 2019-11-14 DIAGNOSIS — M47812 Spondylosis without myelopathy or radiculopathy, cervical region: Secondary | ICD-10-CM | POA: Diagnosis not present

## 2019-12-11 DIAGNOSIS — E538 Deficiency of other specified B group vitamins: Secondary | ICD-10-CM | POA: Diagnosis not present

## 2019-12-11 DIAGNOSIS — F5222 Female sexual arousal disorder: Secondary | ICD-10-CM | POA: Diagnosis not present

## 2019-12-15 DIAGNOSIS — M2550 Pain in unspecified joint: Secondary | ICD-10-CM | POA: Diagnosis not present

## 2019-12-15 DIAGNOSIS — M0579 Rheumatoid arthritis with rheumatoid factor of multiple sites without organ or systems involvement: Secondary | ICD-10-CM | POA: Diagnosis not present

## 2019-12-15 DIAGNOSIS — Z79899 Other long term (current) drug therapy: Secondary | ICD-10-CM | POA: Diagnosis not present

## 2020-01-08 DIAGNOSIS — E538 Deficiency of other specified B group vitamins: Secondary | ICD-10-CM | POA: Diagnosis not present

## 2020-01-08 DIAGNOSIS — F520 Hypoactive sexual desire disorder: Secondary | ICD-10-CM | POA: Diagnosis not present

## 2020-01-31 DIAGNOSIS — E038 Other specified hypothyroidism: Secondary | ICD-10-CM | POA: Diagnosis not present

## 2020-01-31 DIAGNOSIS — E063 Autoimmune thyroiditis: Secondary | ICD-10-CM | POA: Diagnosis not present

## 2020-02-14 DIAGNOSIS — F5222 Female sexual arousal disorder: Secondary | ICD-10-CM | POA: Diagnosis not present

## 2020-02-14 DIAGNOSIS — E538 Deficiency of other specified B group vitamins: Secondary | ICD-10-CM | POA: Diagnosis not present

## 2020-02-16 DIAGNOSIS — H9313 Tinnitus, bilateral: Secondary | ICD-10-CM | POA: Diagnosis not present

## 2020-02-21 DIAGNOSIS — M47812 Spondylosis without myelopathy or radiculopathy, cervical region: Secondary | ICD-10-CM | POA: Diagnosis not present

## 2020-03-15 DIAGNOSIS — F5222 Female sexual arousal disorder: Secondary | ICD-10-CM | POA: Diagnosis not present

## 2020-03-15 DIAGNOSIS — E538 Deficiency of other specified B group vitamins: Secondary | ICD-10-CM | POA: Diagnosis not present

## 2020-03-21 DIAGNOSIS — F4310 Post-traumatic stress disorder, unspecified: Secondary | ICD-10-CM | POA: Diagnosis not present

## 2020-03-21 DIAGNOSIS — F4323 Adjustment disorder with mixed anxiety and depressed mood: Secondary | ICD-10-CM | POA: Diagnosis not present

## 2020-03-25 DIAGNOSIS — F4310 Post-traumatic stress disorder, unspecified: Secondary | ICD-10-CM | POA: Diagnosis not present

## 2020-03-25 DIAGNOSIS — F4323 Adjustment disorder with mixed anxiety and depressed mood: Secondary | ICD-10-CM | POA: Diagnosis not present

## 2020-04-24 DIAGNOSIS — F4323 Adjustment disorder with mixed anxiety and depressed mood: Secondary | ICD-10-CM | POA: Diagnosis not present

## 2020-04-24 DIAGNOSIS — F5222 Female sexual arousal disorder: Secondary | ICD-10-CM | POA: Diagnosis not present

## 2020-04-24 DIAGNOSIS — E538 Deficiency of other specified B group vitamins: Secondary | ICD-10-CM | POA: Diagnosis not present

## 2020-04-24 DIAGNOSIS — F4310 Post-traumatic stress disorder, unspecified: Secondary | ICD-10-CM | POA: Diagnosis not present

## 2020-05-03 DIAGNOSIS — F4310 Post-traumatic stress disorder, unspecified: Secondary | ICD-10-CM | POA: Diagnosis not present

## 2020-05-03 DIAGNOSIS — F4323 Adjustment disorder with mixed anxiety and depressed mood: Secondary | ICD-10-CM | POA: Diagnosis not present

## 2020-05-09 DIAGNOSIS — F4310 Post-traumatic stress disorder, unspecified: Secondary | ICD-10-CM | POA: Diagnosis not present

## 2020-05-09 DIAGNOSIS — F4323 Adjustment disorder with mixed anxiety and depressed mood: Secondary | ICD-10-CM | POA: Diagnosis not present

## 2020-05-22 DIAGNOSIS — E538 Deficiency of other specified B group vitamins: Secondary | ICD-10-CM | POA: Diagnosis not present

## 2020-05-22 DIAGNOSIS — F5222 Female sexual arousal disorder: Secondary | ICD-10-CM | POA: Diagnosis not present

## 2020-05-23 DIAGNOSIS — E538 Deficiency of other specified B group vitamins: Secondary | ICD-10-CM | POA: Diagnosis not present

## 2020-05-23 DIAGNOSIS — M47812 Spondylosis without myelopathy or radiculopathy, cervical region: Secondary | ICD-10-CM | POA: Diagnosis not present

## 2020-05-23 DIAGNOSIS — F5222 Female sexual arousal disorder: Secondary | ICD-10-CM | POA: Diagnosis not present

## 2020-05-28 ENCOUNTER — Other Ambulatory Visit: Payer: Self-pay | Admitting: Obstetrics and Gynecology

## 2020-05-28 DIAGNOSIS — Z1231 Encounter for screening mammogram for malignant neoplasm of breast: Secondary | ICD-10-CM

## 2020-05-28 DIAGNOSIS — Z01419 Encounter for gynecological examination (general) (routine) without abnormal findings: Secondary | ICD-10-CM | POA: Diagnosis not present

## 2020-05-28 DIAGNOSIS — F5222 Female sexual arousal disorder: Secondary | ICD-10-CM | POA: Diagnosis not present

## 2020-05-28 DIAGNOSIS — Z1211 Encounter for screening for malignant neoplasm of colon: Secondary | ICD-10-CM | POA: Diagnosis not present

## 2020-06-07 DIAGNOSIS — F4323 Adjustment disorder with mixed anxiety and depressed mood: Secondary | ICD-10-CM | POA: Diagnosis not present

## 2020-06-07 DIAGNOSIS — F4310 Post-traumatic stress disorder, unspecified: Secondary | ICD-10-CM | POA: Diagnosis not present

## 2020-06-12 DIAGNOSIS — F5222 Female sexual arousal disorder: Secondary | ICD-10-CM | POA: Diagnosis not present

## 2020-06-14 DIAGNOSIS — M5030 Other cervical disc degeneration, unspecified cervical region: Secondary | ICD-10-CM | POA: Diagnosis not present

## 2020-06-14 DIAGNOSIS — M150 Primary generalized (osteo)arthritis: Secondary | ICD-10-CM | POA: Diagnosis not present

## 2020-06-14 DIAGNOSIS — M2550 Pain in unspecified joint: Secondary | ICD-10-CM | POA: Diagnosis not present

## 2020-06-14 DIAGNOSIS — M0579 Rheumatoid arthritis with rheumatoid factor of multiple sites without organ or systems involvement: Secondary | ICD-10-CM | POA: Diagnosis not present

## 2020-06-19 DIAGNOSIS — F5222 Female sexual arousal disorder: Secondary | ICD-10-CM | POA: Diagnosis not present

## 2020-06-19 DIAGNOSIS — E538 Deficiency of other specified B group vitamins: Secondary | ICD-10-CM | POA: Diagnosis not present

## 2020-07-05 ENCOUNTER — Other Ambulatory Visit: Payer: Self-pay

## 2020-07-05 ENCOUNTER — Ambulatory Visit
Admission: RE | Admit: 2020-07-05 | Discharge: 2020-07-05 | Disposition: A | Discharge status: Home / Self Care | Payer: BC Managed Care – PPO | Source: Ambulatory Visit | Attending: Obstetrics and Gynecology | Admitting: Obstetrics and Gynecology

## 2020-07-05 DIAGNOSIS — Z1231 Encounter for screening mammogram for malignant neoplasm of breast: Secondary | ICD-10-CM | POA: Insufficient documentation

## 2020-07-17 DIAGNOSIS — F5222 Female sexual arousal disorder: Secondary | ICD-10-CM | POA: Diagnosis not present

## 2020-07-17 DIAGNOSIS — E538 Deficiency of other specified B group vitamins: Secondary | ICD-10-CM | POA: Diagnosis not present

## 2020-07-18 DIAGNOSIS — F4310 Post-traumatic stress disorder, unspecified: Secondary | ICD-10-CM | POA: Diagnosis not present

## 2020-07-18 DIAGNOSIS — F4323 Adjustment disorder with mixed anxiety and depressed mood: Secondary | ICD-10-CM | POA: Diagnosis not present

## 2020-08-08 DIAGNOSIS — F4323 Adjustment disorder with mixed anxiety and depressed mood: Secondary | ICD-10-CM | POA: Diagnosis not present

## 2020-08-08 DIAGNOSIS — F4310 Post-traumatic stress disorder, unspecified: Secondary | ICD-10-CM | POA: Diagnosis not present

## 2020-08-14 DIAGNOSIS — E063 Autoimmune thyroiditis: Secondary | ICD-10-CM | POA: Diagnosis not present

## 2020-08-14 DIAGNOSIS — E038 Other specified hypothyroidism: Secondary | ICD-10-CM | POA: Diagnosis not present

## 2020-08-14 DIAGNOSIS — E538 Deficiency of other specified B group vitamins: Secondary | ICD-10-CM | POA: Diagnosis not present

## 2020-08-14 DIAGNOSIS — F5222 Female sexual arousal disorder: Secondary | ICD-10-CM | POA: Diagnosis not present

## 2020-08-21 DIAGNOSIS — M47812 Spondylosis without myelopathy or radiculopathy, cervical region: Secondary | ICD-10-CM | POA: Diagnosis not present

## 2020-09-10 DIAGNOSIS — F4310 Post-traumatic stress disorder, unspecified: Secondary | ICD-10-CM | POA: Diagnosis not present

## 2020-09-10 DIAGNOSIS — F4323 Adjustment disorder with mixed anxiety and depressed mood: Secondary | ICD-10-CM | POA: Diagnosis not present

## 2020-09-11 DIAGNOSIS — E538 Deficiency of other specified B group vitamins: Secondary | ICD-10-CM | POA: Diagnosis not present

## 2020-09-11 DIAGNOSIS — F5222 Female sexual arousal disorder: Secondary | ICD-10-CM | POA: Diagnosis not present

## 2020-09-18 DIAGNOSIS — E063 Autoimmune thyroiditis: Secondary | ICD-10-CM | POA: Diagnosis not present

## 2020-09-18 DIAGNOSIS — E038 Other specified hypothyroidism: Secondary | ICD-10-CM | POA: Diagnosis not present

## 2020-10-01 DIAGNOSIS — F4323 Adjustment disorder with mixed anxiety and depressed mood: Secondary | ICD-10-CM | POA: Diagnosis not present

## 2020-10-01 DIAGNOSIS — F4310 Post-traumatic stress disorder, unspecified: Secondary | ICD-10-CM | POA: Diagnosis not present

## 2020-10-09 DIAGNOSIS — E538 Deficiency of other specified B group vitamins: Secondary | ICD-10-CM | POA: Diagnosis not present

## 2020-10-09 DIAGNOSIS — F5222 Female sexual arousal disorder: Secondary | ICD-10-CM | POA: Diagnosis not present

## 2020-10-24 DIAGNOSIS — F4310 Post-traumatic stress disorder, unspecified: Secondary | ICD-10-CM | POA: Diagnosis not present

## 2020-10-24 DIAGNOSIS — F4323 Adjustment disorder with mixed anxiety and depressed mood: Secondary | ICD-10-CM | POA: Diagnosis not present

## 2020-11-07 DIAGNOSIS — M5441 Lumbago with sciatica, right side: Secondary | ICD-10-CM | POA: Diagnosis not present

## 2020-11-07 DIAGNOSIS — G8929 Other chronic pain: Secondary | ICD-10-CM | POA: Diagnosis not present

## 2020-11-07 DIAGNOSIS — M542 Cervicalgia: Secondary | ICD-10-CM | POA: Diagnosis not present

## 2020-11-07 DIAGNOSIS — M4120 Other idiopathic scoliosis, site unspecified: Secondary | ICD-10-CM | POA: Diagnosis not present

## 2020-11-07 DIAGNOSIS — M5442 Lumbago with sciatica, left side: Secondary | ICD-10-CM | POA: Diagnosis not present

## 2020-11-07 DIAGNOSIS — M5412 Radiculopathy, cervical region: Secondary | ICD-10-CM | POA: Diagnosis not present

## 2020-11-07 DIAGNOSIS — M5416 Radiculopathy, lumbar region: Secondary | ICD-10-CM | POA: Diagnosis not present

## 2020-11-11 DIAGNOSIS — E538 Deficiency of other specified B group vitamins: Secondary | ICD-10-CM | POA: Diagnosis not present

## 2020-11-11 DIAGNOSIS — F5222 Female sexual arousal disorder: Secondary | ICD-10-CM | POA: Diagnosis not present

## 2020-11-13 DIAGNOSIS — F4310 Post-traumatic stress disorder, unspecified: Secondary | ICD-10-CM | POA: Diagnosis not present

## 2020-11-13 DIAGNOSIS — F4323 Adjustment disorder with mixed anxiety and depressed mood: Secondary | ICD-10-CM | POA: Diagnosis not present

## 2020-11-21 DIAGNOSIS — M5412 Radiculopathy, cervical region: Secondary | ICD-10-CM | POA: Diagnosis not present

## 2020-11-21 DIAGNOSIS — M542 Cervicalgia: Secondary | ICD-10-CM | POA: Diagnosis not present

## 2020-12-09 DIAGNOSIS — E538 Deficiency of other specified B group vitamins: Secondary | ICD-10-CM | POA: Diagnosis not present

## 2020-12-09 DIAGNOSIS — F5222 Female sexual arousal disorder: Secondary | ICD-10-CM | POA: Diagnosis not present

## 2020-12-11 DIAGNOSIS — M4313 Spondylolisthesis, cervicothoracic region: Secondary | ICD-10-CM | POA: Diagnosis not present

## 2020-12-11 DIAGNOSIS — M4802 Spinal stenosis, cervical region: Secondary | ICD-10-CM | POA: Diagnosis not present

## 2020-12-11 DIAGNOSIS — M4316 Spondylolisthesis, lumbar region: Secondary | ICD-10-CM | POA: Diagnosis not present

## 2020-12-11 DIAGNOSIS — G5603 Carpal tunnel syndrome, bilateral upper limbs: Secondary | ICD-10-CM | POA: Diagnosis not present

## 2020-12-13 DIAGNOSIS — M2550 Pain in unspecified joint: Secondary | ICD-10-CM | POA: Diagnosis not present

## 2020-12-13 DIAGNOSIS — Z79899 Other long term (current) drug therapy: Secondary | ICD-10-CM | POA: Diagnosis not present

## 2020-12-13 DIAGNOSIS — M0579 Rheumatoid arthritis with rheumatoid factor of multiple sites without organ or systems involvement: Secondary | ICD-10-CM | POA: Diagnosis not present

## 2020-12-13 DIAGNOSIS — M150 Primary generalized (osteo)arthritis: Secondary | ICD-10-CM | POA: Diagnosis not present

## 2020-12-17 DIAGNOSIS — L570 Actinic keratosis: Secondary | ICD-10-CM | POA: Diagnosis not present

## 2020-12-17 DIAGNOSIS — C44519 Basal cell carcinoma of skin of other part of trunk: Secondary | ICD-10-CM | POA: Diagnosis not present

## 2020-12-17 DIAGNOSIS — Z85828 Personal history of other malignant neoplasm of skin: Secondary | ICD-10-CM | POA: Diagnosis not present

## 2020-12-17 DIAGNOSIS — D485 Neoplasm of uncertain behavior of skin: Secondary | ICD-10-CM | POA: Diagnosis not present

## 2020-12-17 DIAGNOSIS — L814 Other melanin hyperpigmentation: Secondary | ICD-10-CM | POA: Diagnosis not present

## 2020-12-17 DIAGNOSIS — L821 Other seborrheic keratosis: Secondary | ICD-10-CM | POA: Diagnosis not present

## 2020-12-25 ENCOUNTER — Encounter (INDEPENDENT_AMBULATORY_CARE_PROVIDER_SITE_OTHER): Payer: BC Managed Care – PPO | Admitting: Neurology

## 2020-12-25 ENCOUNTER — Ambulatory Visit: Payer: BC Managed Care – PPO | Admitting: Neurology

## 2020-12-25 DIAGNOSIS — R202 Paresthesia of skin: Secondary | ICD-10-CM

## 2020-12-25 DIAGNOSIS — Z0289 Encounter for other administrative examinations: Secondary | ICD-10-CM

## 2020-12-25 DIAGNOSIS — G5601 Carpal tunnel syndrome, right upper limb: Secondary | ICD-10-CM | POA: Insufficient documentation

## 2020-12-25 NOTE — Procedures (Signed)
Full Name: Tina Olsen Gender: Female MRN #: [id number] Date of Birth: Oct 10, 1961    Visit Date: 12/25/2020 07:25 Age: 60 Years Examining Physician: Marcial Pacas, MD  Referring Physician: Erline Levine, MD History: 60 year old right-handed female complains of intermittent right hand paresthesia, right neck pain, occasionally episode of exacerbation, radiating pain from right shoulder to her right armpit, traveled to the right ulnar arm and forearm region, involving all 5 fingers  On examination: Bilateral upper and lower extremity motor strength is normal, deep tendon reflex was normal and symmetric.  Decreased to pinprick at the right first 4 finger pads, right Tinel's sign was present  Summary of the test: Nerve conduction study: Right median sensory response showed mildly prolonged peak latency with normal snap amplitude.  Right median motor responses were normal. Left median sensory, motor, mixed responses were normal. Bilateral ulnar sensory and motor responses were normal.  Electromyography: Selected needle examination of bilateral upper extremity muscles and bilateral cervical paraspinal muscles showed no significant abnormalities.  Conclusion: This is an abnormal study.  There is electrodiagnostic evidence of mild median neuropathy across the wrist, consistent with mild right carpal tunnel syndromes.  There is no evidence of bilateral cervical radiculopathy, or left upper extremity neuropathy.    ------------------------------- Marcial Pacas, M.D. PhD  [facility] [address], [place], [place] [address] Tel: [phone] Fax: [phone]  Verbal informed consent was obtained from the patient, patient was informed of potential risk of procedure, including bruising, bleeding, hematoma formation, infection, muscle weakness, muscle pain, numbness, among others.        [place]    Nerve / Sites Muscle Latency Ref. Amplitude Ref. Rel Amp  Segments Distance Velocity Ref. Area    ms ms mV mV %  cm m/s m/s mVms  R Median - APB     Wrist APB 3.8 ?4.4 7.3 ?4.0 100 Wrist - APB 7   28.4     Upper arm APB 7.5  7.2  98.4 Upper arm - Wrist 21 55 ?49 27.9  L Median - APB     Wrist APB 3.4 ?4.4 5.8 ?4.0 100 Wrist - APB 7   28.3     Upper arm APB 6.9  5.8  99.4 Upper arm - Wrist 19 54 ?49 27.5  R Ulnar - ADM     Wrist ADM 3.3 ?3.3 9.8 ?6.0 100 Wrist - ADM 7   28.5     B.Elbow ADM 6.4  9.1  93.3 B.Elbow - Wrist 19 62 ?49 25.6     A.Elbow ADM 8.1  8.7  95.9 A.Elbow - B.Elbow 10 58 ?49 25.3  L Ulnar - ADM     Wrist ADM 2.7 ?3.3 8.7 ?6.0 100 Wrist - ADM 7   29.0     B.Elbow ADM 5.4  7.8  88.8 B.Elbow - Wrist 18 66 ?49 26.2     A.Elbow ADM 7.1  8.4  108 A.Elbow - B.Elbow 10 62 ?49 28.1             SNC    Nerve / Sites Rec. Site Peak Lat Ref.  Amp Ref. Segments Distance Peak Diff Ref.    ms ms V V  cm ms ms  R Median, Ulnar - Transcarpal comparison     Median Palm Wrist 2.5 ?2.2 76 ?35 Median Palm - Wrist 8       Ulnar Palm Wrist 2.1 ?2.2 35 ?12 Ulnar Palm - Wrist 8  Median Palm - Ulnar Palm  0.4 ?0.4  L Median, Ulnar - Transcarpal comparison     Median Palm Wrist 2.2 ?2.2 106 ?35 Median Palm - Wrist 8       Ulnar Palm Wrist 2.2 ?2.2 24 ?12 Ulnar Palm - Wrist 8          Median Palm - Ulnar Palm  0.0 ?0.4  R Median - Orthodromic (Dig II, Mid palm)     Dig II Wrist 3.5 ?3.4 14 ?10 Dig II - Wrist 13    L Median - Orthodromic (Dig II, Mid palm)     Dig II Wrist 3.1 ?3.4 16 ?10 Dig II - Wrist 13    R Ulnar - Orthodromic, (Dig V, Mid palm)     Dig V Wrist 2.6 ?3.1 14 ?5 Dig V - Wrist 11    L Ulnar - Orthodromic, (Dig V, Mid palm)     Dig V Wrist 3.0 ?3.1 9 ?5 Dig V - Wrist 47                   F  Wave    Nerve F Lat Ref.   ms ms  R Ulnar - ADM 27.7 ?32.0  L Ulnar - ADM 26.6 ?32.0         EMG Summary Table    Spontaneous MUAP Recruitment  Muscle IA Fib PSW Fasc Other Amp Dur. Poly Pattern  R. Abductor pollicis brevis  Normal None None None _______ Normal Normal Normal Normal  R. First dorsal interosseous Normal None None None _______ Normal Normal Normal Normal  R. Pronator teres Normal None None None _______ Normal Normal Normal Normal  R. Biceps brachii Normal None None None _______ Normal Normal Normal Normal  R. Deltoid Normal None None None _______ Normal Normal Normal Normal  R. Triceps brachii Normal None None None _______ Normal Normal Normal Normal  R. Brachioradialis Normal None None None _______ Normal Normal Normal Normal  L. First dorsal interosseous Normal None None None _______ Normal Normal Normal Normal  L. Abductor pollicis brevis Normal None None None _______ Normal Normal Normal Normal  L. Pronator teres Normal None None None _______ Normal Normal Normal Normal  L. Biceps brachii Normal None None None _______ Normal Normal Normal Normal  L. Deltoid Normal None None None _______ Normal Normal Normal Normal  L. Triceps brachii Normal None None None _______ Normal Normal Normal Normal  R. Cervical paraspinals Normal None None None _______ Normal Normal Normal Normal  L. Cervical paraspinals Normal None None None _______ Normal Normal Normal Normal

## 2020-12-26 DIAGNOSIS — M5416 Radiculopathy, lumbar region: Secondary | ICD-10-CM | POA: Diagnosis not present

## 2020-12-26 DIAGNOSIS — M48061 Spinal stenosis, lumbar region without neurogenic claudication: Secondary | ICD-10-CM | POA: Diagnosis not present

## 2020-12-31 DIAGNOSIS — F4323 Adjustment disorder with mixed anxiety and depressed mood: Secondary | ICD-10-CM | POA: Diagnosis not present

## 2020-12-31 DIAGNOSIS — F4310 Post-traumatic stress disorder, unspecified: Secondary | ICD-10-CM | POA: Diagnosis not present

## 2021-01-03 DIAGNOSIS — F5222 Female sexual arousal disorder: Secondary | ICD-10-CM | POA: Diagnosis not present

## 2021-01-03 DIAGNOSIS — E538 Deficiency of other specified B group vitamins: Secondary | ICD-10-CM | POA: Diagnosis not present

## 2021-01-06 DIAGNOSIS — M4316 Spondylolisthesis, lumbar region: Secondary | ICD-10-CM | POA: Diagnosis not present

## 2021-01-06 DIAGNOSIS — M5416 Radiculopathy, lumbar region: Secondary | ICD-10-CM | POA: Diagnosis not present

## 2021-01-06 DIAGNOSIS — M4120 Other idiopathic scoliosis, site unspecified: Secondary | ICD-10-CM | POA: Diagnosis not present

## 2021-01-06 DIAGNOSIS — M5441 Lumbago with sciatica, right side: Secondary | ICD-10-CM | POA: Diagnosis not present

## 2021-01-13 DIAGNOSIS — F4323 Adjustment disorder with mixed anxiety and depressed mood: Secondary | ICD-10-CM | POA: Diagnosis not present

## 2021-01-13 DIAGNOSIS — F4310 Post-traumatic stress disorder, unspecified: Secondary | ICD-10-CM | POA: Diagnosis not present

## 2021-01-14 ENCOUNTER — Other Ambulatory Visit: Payer: Self-pay | Admitting: Neurosurgery

## 2021-01-14 DIAGNOSIS — M8580 Other specified disorders of bone density and structure, unspecified site: Secondary | ICD-10-CM

## 2021-01-21 DIAGNOSIS — C44519 Basal cell carcinoma of skin of other part of trunk: Secondary | ICD-10-CM | POA: Diagnosis not present

## 2021-01-22 ENCOUNTER — Other Ambulatory Visit: Payer: Self-pay | Admitting: Neurosurgery

## 2021-01-22 DIAGNOSIS — M8580 Other specified disorders of bone density and structure, unspecified site: Secondary | ICD-10-CM

## 2021-01-31 DIAGNOSIS — F5222 Female sexual arousal disorder: Secondary | ICD-10-CM | POA: Diagnosis not present

## 2021-01-31 DIAGNOSIS — E538 Deficiency of other specified B group vitamins: Secondary | ICD-10-CM | POA: Diagnosis not present

## 2021-02-03 ENCOUNTER — Other Ambulatory Visit: Payer: Self-pay

## 2021-02-03 ENCOUNTER — Ambulatory Visit
Admission: RE | Admit: 2021-02-03 | Discharge: 2021-02-03 | Disposition: A | Discharge status: Home / Self Care | Payer: BC Managed Care – PPO | Source: Ambulatory Visit | Attending: Neurosurgery | Admitting: Neurosurgery

## 2021-02-03 DIAGNOSIS — F4310 Post-traumatic stress disorder, unspecified: Secondary | ICD-10-CM | POA: Diagnosis not present

## 2021-02-03 DIAGNOSIS — M8589 Other specified disorders of bone density and structure, multiple sites: Secondary | ICD-10-CM | POA: Diagnosis not present

## 2021-02-03 DIAGNOSIS — M8580 Other specified disorders of bone density and structure, unspecified site: Secondary | ICD-10-CM | POA: Insufficient documentation

## 2021-02-03 DIAGNOSIS — Z780 Asymptomatic menopausal state: Secondary | ICD-10-CM | POA: Diagnosis not present

## 2021-02-03 DIAGNOSIS — F4323 Adjustment disorder with mixed anxiety and depressed mood: Secondary | ICD-10-CM | POA: Diagnosis not present

## 2021-02-19 DIAGNOSIS — M4120 Other idiopathic scoliosis, site unspecified: Secondary | ICD-10-CM | POA: Diagnosis not present

## 2021-02-19 DIAGNOSIS — M85859 Other specified disorders of bone density and structure, unspecified thigh: Secondary | ICD-10-CM | POA: Diagnosis not present

## 2021-02-19 DIAGNOSIS — M4316 Spondylolisthesis, lumbar region: Secondary | ICD-10-CM | POA: Diagnosis not present

## 2021-02-19 DIAGNOSIS — M5441 Lumbago with sciatica, right side: Secondary | ICD-10-CM | POA: Diagnosis not present

## 2021-02-20 ENCOUNTER — Other Ambulatory Visit: Payer: BC Managed Care – PPO

## 2021-02-27 DIAGNOSIS — F5222 Female sexual arousal disorder: Secondary | ICD-10-CM | POA: Diagnosis not present

## 2021-02-27 DIAGNOSIS — E538 Deficiency of other specified B group vitamins: Secondary | ICD-10-CM | POA: Diagnosis not present

## 2021-03-04 DIAGNOSIS — M5416 Radiculopathy, lumbar region: Secondary | ICD-10-CM | POA: Diagnosis not present

## 2021-03-11 DIAGNOSIS — F4310 Post-traumatic stress disorder, unspecified: Secondary | ICD-10-CM | POA: Diagnosis not present

## 2021-03-11 DIAGNOSIS — F4323 Adjustment disorder with mixed anxiety and depressed mood: Secondary | ICD-10-CM | POA: Diagnosis not present

## 2021-04-01 DIAGNOSIS — M8580 Other specified disorders of bone density and structure, unspecified site: Secondary | ICD-10-CM | POA: Diagnosis not present

## 2021-04-01 DIAGNOSIS — E039 Hypothyroidism, unspecified: Secondary | ICD-10-CM | POA: Diagnosis not present

## 2021-04-02 DIAGNOSIS — F5222 Female sexual arousal disorder: Secondary | ICD-10-CM | POA: Diagnosis not present

## 2021-04-02 DIAGNOSIS — E538 Deficiency of other specified B group vitamins: Secondary | ICD-10-CM | POA: Diagnosis not present

## 2021-04-14 DIAGNOSIS — M5416 Radiculopathy, lumbar region: Secondary | ICD-10-CM | POA: Diagnosis not present

## 2021-04-14 DIAGNOSIS — M48061 Spinal stenosis, lumbar region without neurogenic claudication: Secondary | ICD-10-CM | POA: Diagnosis not present

## 2021-04-14 DIAGNOSIS — M4120 Other idiopathic scoliosis, site unspecified: Secondary | ICD-10-CM | POA: Diagnosis not present

## 2021-04-14 DIAGNOSIS — M5441 Lumbago with sciatica, right side: Secondary | ICD-10-CM | POA: Diagnosis not present

## 2021-04-16 ENCOUNTER — Other Ambulatory Visit: Payer: Self-pay | Admitting: Neurosurgery

## 2021-04-17 DIAGNOSIS — F4310 Post-traumatic stress disorder, unspecified: Secondary | ICD-10-CM | POA: Diagnosis not present

## 2021-04-17 DIAGNOSIS — F4323 Adjustment disorder with mixed anxiety and depressed mood: Secondary | ICD-10-CM | POA: Diagnosis not present

## 2021-04-18 DIAGNOSIS — M85851 Other specified disorders of bone density and structure, right thigh: Secondary | ICD-10-CM | POA: Diagnosis not present

## 2021-04-18 DIAGNOSIS — M85852 Other specified disorders of bone density and structure, left thigh: Secondary | ICD-10-CM | POA: Diagnosis not present

## 2021-04-21 DIAGNOSIS — M4316 Spondylolisthesis, lumbar region: Secondary | ICD-10-CM | POA: Diagnosis not present

## 2021-04-21 NOTE — H&P (Signed)
Patient ID:   [id number] Patient: Tina Olsen  Date of Birth: 1961-06-15 Visit Type: Office Visit   Date: 04/14/2021 11:00 AM Provider: Marchia Meiers. Vertell Limber MD   This 59 year old female presents for back pain.  HISTORY OF PRESENT ILLNESS: 1.  back pain  The patient returns to evaluate her interval progress.  She had an injection, which consisted of L4-5 intralaminar epidural steroid injection on 03/04/2021 and says this did not give her any improvement.  She is currently continuing to complain of significant pain which she grades at 5/10 in severity.  She has seen Dr. Chalmers Cater who has started her on Fosamax and estrogen replacement and has diagnosed her osteoporosis as due to rheumatoid arthritis, thyroid dysfunction, post menopausal and family history.  She did not feel that additional workup was required.  The patient has had imaging of her lumbar spine which shows 15 degree coronal curvature with a PT of 12 and S VA of 32 with lumbar lordosis of 39.  She has focal stenosis and scoliosis centered at the L3-4 and L4-5 levels with spondylolisthesis at this level.  She is not getting relief with further conservative management.  She has done exercises and physical therapy and is also done injections without relief.  She has scaled back her bicycle riding, but this has not helped a great deal in relieving her pain.      Medical/Surgical/Interim History Reviewed, no change.  Last detailed document date:11/07/2020.     PAST MEDICAL HISTORY, SURGICAL HISTORY, FAMILY HISTORY, SOCIAL HISTORY AND REVIEW OF SYSTEMS I have reviewed the patient's past medical, surgical, family and social history as well as the comprehensive review of systems as included on the [facility] history form dated 03/04/2021, which I have signed.  Family History: Reviewed, no changes.  Last detailed document date:11/07/2020.   Social History: Reviewed, no changes. Last detailed  document date: 11/07/2020.    MEDICATIONS: (added, continued or stopped this visit) Started Medication Directions Instruction Stopped  acetaminophen 325 mg capsule     B-12 Compliance 1,000 mcg/mL injection kit inject 1 milliliter by subcutaneous route  every month    biotin     cetirizine 10 mg tablet take 1 tablet by oral route  every day    diclofenac sodium 75 mg tablet,delayed release take 1 tablet by oral route 2 times every day    Fosamax 70 mg tablet take 1 tablet by oral route  every week in the morning, at least 30 min before first food, beverage, or medication of day   12/11/2020 gabapentin 300 mg capsule take 1 capsule by oral route at bedtime, may increase to tid gradually if symptoms require    magnesium citrate (bulk) powder    11/07/2020 methocarbamol 500 mg tablet take 1 tablet by oral route 4 times every day    vitamin d-3       ALLERGIES: Ingredient Reaction Medication Name Comment SULFA (SULFONAMIDE ANTIBIOTICS)     Reviewed, no changes.    PHYSICAL EXAM:  Vitals Date Temp F BP Pulse Ht In Wt Lb BMI BSA Pain Score 04/14/2021  129/75 58 63 130.8 23.17  5/10     IMPRESSION:  The patient has scoliosis, spondylolisthesis, spondylosis and foraminal stenosis affecting the L3-4 and L4-5 levels  PLAN: Proceed with right-sided XL IF at the L2-3 4 and L4-5 levels with percutaneous pedicle screw fixation.  Patient was given a prescription for a lumbosacral orthosis.  Patient education was performed in detail.  She wishes to proceed with surgery.  Orders: Diagnostic Procedures: Assessment Procedure M54.16 Lumbar Spine- AP/Lat Instruction(s)/Education: Assessment Instruction R03.0 Lifestyle education Miscellaneous: Assessment  M43.16 LSO Brace  Completed Orders (this encounter) Order Details Reason Side Interpretation Result Initial Treatment Date Region Lifestyle  education Patient will monitor and contact primary care physician if needed.        Assessment/Plan  # Detail Type Description  1. Assessment Lumbago with sciatica, right side (M54.41).     2. Assessment Degenerative lumbar spinal stenosis (M48.061).     3. Assessment Radiculopathy, lumbar region (M54.16).     4. Assessment Scoliosis (and kyphoscoliosis), idiopathic (M41.20).     5. Assessment Spondylolisthesis, lumbar region (M43.16).  Plan Orders LSO Brace.     6. Assessment Elevated blood-pressure reading, w/o diagnosis of htn (R03.0).       Pain Management Plan Pain Scale: 5/10. Method: Numeric Pain Intensity Scale. Location: back. Onset: 11/07/2020. Duration: varies. Quality: discomforting. Pain management follow-up plan of care: Patient will continue medication management..              Provider:  Marchia Meiers. Vertell Limber MD  04/18/2021 10:44 AM    Dictation edited by: Marchia Meiers. Vertell Limber    CC Providers: [facility] [place],  [place]  [id number]-   Joshua McDaniel NP  [address] [place], [place] [address]-               Electronically signed by Marchia Meiers. Vertell Limber MD on 04/18/2021 10:44 AM    Patient ID:   [id number] Patient: Tina Olsen  Date of Birth: 01-Dec-1960 Visit Type: Office Visit   Date: 11/07/2020 09:30 AM Provider: Marchia Meiers. Vertell Limber MD   This 60 year old female presents for neck pain /back/rt arm.  HISTORY OF PRESENT ILLNESS: 1.  neck pain /back/rt arm  Tina Olsen is a 60 year old female who is referred for neurosurgical evaluation due to neck and right arm pain, and low back pain.  The patient reports that she has had a long history of chronic low back pain since she was a teenager.  She also reported that she was diagnosed with scoliosis as a child.  She stated that her last flare up of low back pain was about 5  years ago and was ultimately treated with p.o. Steroids and physical therapy which resolved her symptoms.  About 4 years ago she had a severe flare-up of her neck pain for which she ultimately went to the emergency department and underwent a cardiac workup due to complaints of left arm pain.  Her cardiac workup was unremarkable and was ultimately sent to physical therapy and prescribed a course of p.o. Steroids which then led to a resolution of her symptoms.  About 2 years ago she had a he neither flare up of her cervical pain for which she began undergoing C7/T1 bilateral facet block injections.  She reports her last injection was in October of 2021. She has been getting injections about every 3 months for the last 18 months.  Her last injection was reported to last about 6 weeks in offered about 50% improvement in her symptoms.  However, her injections were more targeted in her cervical pain and not her radicular pain.  Today, the patient has complaints of a recent exacerbation of her cervical pain that began about 1 week ago.  She stated that she woke up 1 morning with her right upper extremity numb and her "  hand was on fire."  She describes her neck pain to affect her entire neck and radiates into her right shoulder down into her right upper extremity.  She does report that the numbness travels into her entire hand and affects her forearm.  She currently rates her pain to be a 4 to 5/10 add a 20/10 at its worst.  She continues to do exercises that were prescribed from her physical therapy sessions about 4 years ago.  She is currently taking Tylenol and diclofenac which helped moderately.  Her low back pain was reported to be bilateral with intermittent radiculopathy in her bilateral lower extremities, left greater than right.  She had also noted some occasional numbness in her bilateral lower extremities that travel into her feet affecting the heel and sole.     Medications:  Diclofenac 75 mg b.i.d. (taken  for RA), acetaminophen 1000 mg q.d.  Past medical history:  Rheumatoid arthritis, Hashimoto's disease  Past surgical history:  2015 hysterectomy, 2007 abdominal laparoscopic E, 1989 abdominal laparoscopic E, 1984 full laparotomy due to PID       PAST MEDICAL/SURGICAL HISTORY:   (Detailed)      Family History:  (Detailed)   Social History:  (Detailed) Tobacco use reviewed. Preferred language is [place].   Tobacco use status: Current non-smoker. Smoking status: Never smoker.  SMOKING STATUS Type Smoking Status Usage Per Day Years Used Total Pack Years  Never smoker         MEDICATIONS: (added, continued or stopped this visit) Started Medication Directions Instruction Stopped  acetaminophen 325 mg capsule     B-12 Compliance 1,000 mcg/mL injection kit inject 1 milliliter by subcutaneous route  every month    biotin     cetirizine 10 mg tablet take 1 tablet by oral route  every day    magnesium citrate (bulk) powder    11/07/2020 methocarbamol 500 mg tablet take 1 tablet by oral route 4 times every day    vitamin d-3       ALLERGIES: Ingredient Reaction Medication Name Comment SULFA (SULFONAMIDE ANTIBIOTICS)     Reviewed, updated.    PHYSICAL EXAM:  Vitals Date Temp F BP Pulse Ht In Wt Lb BMI BSA Pain Score 11/07/2020    63 133 23.56  5/10   PHYSICAL EXAM Details General Level of Distress: no acute distress Overall Appearance: normal    Cardiovascular Cardiac: regular rate and rhythm without murmur  Right Left  Carotid Pulses: normal normal  Respiratory Lungs: clear to auscultation  Neurological Orientation: normal Recent and Remote Memory: normal Attention Span and Concentration:   normal Language: normal Fund of Knowledge: normal  Right Left Sensation: normal normal Upper Extremity Coordination: normal normal  Lower Extremity  Coordination: normal normal  Musculoskeletal Gait and Station: normal  Right Left Upper Extremity Muscle Strength: normal normal Lower Extremity Muscle Strength: normal normal Upper Extremity Muscle Tone:  normal normal Lower Extremity Muscle Tone: normal normal   Motor Strength Upper and lower extremity motor strength was tested in the clinically pertinent muscles. Any abnormal findings will be noted below.   Right Left Grip: 4/5  Finger Extensor: 4/5    Deep Tendon Reflexes  Right Left Biceps: increase increase Triceps: increase increase Brachioradialis: increase increase Patellar: increase increase Achilles: increase increase  Sensory Sensation was tested at L1 to S1.   Cranial Nerves II. Optic Nerve/Visual Fields: normal III. Oculomotor: normal IV. Trochlear: normal V. Trigeminal: normal VI. Abducens: normal VII. Facial: normal VIII. Acoustic/Vestibular: normal IX. Glossopharyngeal:  normal X. Vagus: normal XI. Spinal Accessory: normal XII. Hypoglossal: normal  Motor and other Tests Lhermittes: negative Rhomberg: negative Pronator drift: absent     Right Left Spurlings positive negative Hoffman's: normal normal Clonus: normal normal Babinski: normal normal SLR: negative negative Patrick's Corky Sox): negative negative Toe Walk: normal normal Toe Lift: normal normal Heel Walk: normal normal Tinels Elbow: negative negative Tinels Wrist: negative negative SI Joint: nontender nontender Phalen: negative negative   Additional Findings:  Suprapatellar reflex 3+    IMPRESSION:  The patient has been having chronic cervical and lumbar pain for many years with intermittent exacerbations.  Her most current cervical pain exacerbation has been ongoing for about 1 week and includes a right upper extremity radiculopathy.  Her chronic low back pain continues to be bothersome, however, her cervical symptoms are significantly more bothersome to her at this time.   On exam she did have increased deep tendon reflexes in her upper and lower extremities.  She also had a suprapatellar reflex and crossed abductor reflex.  Positive Spurling's on the right.  Decreased strength in her right hand.  Since the patient does have weakness and is hyperreflexic, we will address the patient's cervical spine 1st and then addressed her lumbar spine.  PLAN: The patient will be sent for a noncontrast cervical MRI.  She will also be sent for four view cervical radiographs, four view lumbar radiographs, and a/P and lateral scoliosis radiographs.  She will follow-up in the clinic after her imaging has been completed.  She was also given a prescription for methocarbamol 500 mg.  Orders: Diagnostic Procedures: Assessment Procedure M41.20 Scoliosis- AP/Lat M54.12 Cervical Spine- AP/Lat/Flex/Ex M54.12 MRI Spinal/cerv W/o Contrast M54.16 Lumbar Spine- AP/Lat/Flex/Ex  Completed Orders (this encounter) Order Details Reason Side Interpretation Result Initial Treatment Date Region Lumbar Spine- AP/Lat/Flex/Ex      11/07/2020 All Levels to All Levels Cervical Spine- AP/Lat/Flex/Ex      11/07/2020 All Levels to All Levels Scoliosis- AP/Lat      11/07/2020 All Levels to All Levels  Assessment/Plan  # Detail Type Description  1. Assessment Chronic bilateral low back pain with bilateral sciatica (M54.42).     2. Assessment Lumbago with sciatica, right side (M54.41).     3. Assessment Other chronic pain (G89.29).     4. Assessment Cervicalgia (M54.2).     5. Assessment Cervical radiculopathy (M54.12).     6. Assessment Lumbar radiculopathy (M54.16).     7. Assessment Idiopathic scoliosis in adult patient (M41.20).          MEDICATIONS PRESCRIBED TODAY    Rx Quantity Refills METHOCARBAMOL 500 mg  90 0           Provider:  Marchia Meiers. Vertell Limber MD  11/07/2020 11:55 AM    Dictation edited by: Fenton Malling, NP    CC Providers: [facility] [place],  [place]  [id number]-   Joshua McDaniel NP  [address] [place], [place] [address]-               Electronically signed by Fenton Malling NP on 11/07/2020 11:56 AM

## 2021-04-22 NOTE — Pre-Procedure Instructions (Signed)
Surgical Instructions    Your procedure is scheduled on Friday June 17th.  Report to [facility] Main Entrance "A" at 05:30 A.M., then check in with the Admitting office.  Call this number if you have problems the morning of surgery:  [phone]   If you have any questions prior to your surgery date call [phone]: Open Monday-Friday 8am-4pm    Remember:  Do not eat or drink after midnight the night before your surgery     Take these medicines the morning of surgery with A SIP OF WATER   cetirizine (ZYRTEC)  levothyroxine (SYNTHROID, LEVOTHROID  estradiol (ESTRACE)    As of today, STOP taking any Aspirin (unless otherwise instructed by your surgeon) Aleve, Naproxen, Ibuprofen, Motrin, Advil, Goody's, BC's, all herbal medications, fish oil, and all vitamins.          Do not wear jewelry or makeup Do not wear lotions, powders, perfumes/colognes, or deodorant. Do not shave 48 hours prior to surgery.  Men may shave face and neck. Do not bring valuables to the hospital. DO Not wear nail polish, gel polish, artificial nails, or any other type of covering on natural nails including finger and toenails. If patients have artificial nails, gel coating, etc. that need to be removed by a nail salon please have this removed prior to surgery or surgery may need to be canceled/delayed if the surgeon/ anesthesia feels like the patient is unable to be adequately monitored.             [facility] is not responsible for any belongings or valuables.  Do NOT Smoke (Tobacco/Vaping) or drink Alcohol 24 hours prior to your procedure If you use a CPAP at night, you may bring all equipment for your overnight stay.   Contacts, glasses, dentures or partials may not be worn into surgery, please bring cases for these belongings   For patients admitted to the hospital, discharge time will be determined by your treatment team.   Patients discharged the day of surgery will not be allowed to drive home,  and someone needs to stay with them for 24 hours.  ONLY 1 SUPPORT PERSON MAY BE PRESENT WHILE YOU ARE IN SURGERY. IF YOU ARE TO BE ADMITTED ONCE YOU ARE IN YOUR ROOM YOU WILL BE ALLOWED TWO (2) VISITORS.  Minor children may have two parents present. Special consideration for safety and communication needs will be reviewed on a case by case basis.  Special instructions:    Oral Hygiene is also important to reduce your risk of infection.  Remember - BRUSH YOUR TEETH THE MORNING OF SURGERY WITH YOUR REGULAR TOOTHPASTE   [facility]- Preparing For Surgery  Before surgery, you can play an important role. Because skin is not sterile, your skin needs to be as free of germs as possible. You can reduce the number of germs on your skin by washing with CHG (chlorahexidine gluconate) Soap before surgery.  CHG is an antiseptic cleaner which kills germs and bonds with the skin to continue killing germs even after washing.     Please do not use if you have an allergy to CHG or antibacterial soaps. If your skin becomes reddened/irritated stop using the CHG.  Do not shave (including legs and underarms) for at least 48 hours prior to first CHG shower. It is OK to shave your face.  Please follow these instructions carefully.     Shower the NIGHT BEFORE SURGERY and the MORNING OF SURGERY with CHG Soap.   If  you chose to wash your hair, wash your hair first as usual with your normal shampoo. After you shampoo, rinse your hair and body thoroughly to remove the shampoo.  Then [employer] and genitals (private parts) with your normal soap and rinse thoroughly to remove soap.  After that Use CHG Soap as you would any other liquid soap. You can apply CHG directly to the skin and wash gently with a scrungie or a clean washcloth.   Apply the CHG Soap to your body ONLY FROM THE NECK DOWN.  Do not use on open wounds or open sores. Avoid contact with your eyes, ears, mouth and genitals (private parts). Wash Face and  genitals (private parts)  with your normal soap.   Wash thoroughly, paying special attention to the area where your surgery will be performed.  Thoroughly rinse your body with warm water from the neck down.  DO NOT shower/wash with your normal soap after using and rinsing off the CHG Soap.  Pat yourself dry with a CLEAN TOWEL.  Wear CLEAN PAJAMAS to bed the night before surgery  Place CLEAN SHEETS on your bed the night before your surgery  DO NOT SLEEP WITH PETS.   Day of Surgery:  Take a shower with CHG soap. Wear Clean/Comfortable clothing the morning of surgery Do not apply any deodorants/lotions.   Remember to brush your teeth WITH YOUR REGULAR TOOTHPASTE.   Please read over the following fact sheets that you were given.

## 2021-04-23 ENCOUNTER — Other Ambulatory Visit: Payer: Self-pay

## 2021-04-23 ENCOUNTER — Encounter (HOSPITAL_COMMUNITY): Payer: Self-pay

## 2021-04-23 ENCOUNTER — Encounter (HOSPITAL_COMMUNITY)
Admission: RE | Admit: 2021-04-23 | Discharge: 2021-04-23 | Disposition: A | Discharge status: Home / Self Care | Payer: BC Managed Care – PPO | Source: Ambulatory Visit | Attending: Neurosurgery | Admitting: Neurosurgery

## 2021-04-23 DIAGNOSIS — Z20822 Contact with and (suspected) exposure to covid-19: Secondary | ICD-10-CM | POA: Diagnosis not present

## 2021-04-23 DIAGNOSIS — Z01812 Encounter for preprocedural laboratory examination: Secondary | ICD-10-CM | POA: Diagnosis not present

## 2021-04-23 HISTORY — DX: Other specified postprocedural states: Z98.890

## 2021-04-23 HISTORY — DX: Hypothyroidism, unspecified: E03.9

## 2021-04-23 HISTORY — DX: Family history of other specified conditions: Z84.89

## 2021-04-23 HISTORY — DX: Nausea with vomiting, unspecified: R11.2

## 2021-04-23 HISTORY — DX: Pneumonia, unspecified organism: J18.9

## 2021-04-23 LAB — CBC
HCT: 45.4 % (ref 36.0–46.0)
Hemoglobin: 14.9 g/dL (ref 12.0–15.0)
MCH: 32.4 pg (ref 26.0–34.0)
MCHC: 32.8 g/dL (ref 30.0–36.0)
MCV: 98.7 fL (ref 80.0–100.0)
Platelets: 249 10*3/uL (ref 150–400)
RBC: 4.6 MIL/uL (ref 3.87–5.11)
RDW: 12.6 % (ref 11.5–15.5)
WBC: 7.5 10*3/uL (ref 4.0–10.5)
nRBC: 0 % (ref 0.0–0.2)

## 2021-04-23 LAB — TYPE AND SCREEN
ABO/RH(D): B POS
Antibody Screen: NEGATIVE

## 2021-04-23 LAB — SURGICAL PCR SCREEN
MRSA, PCR: NEGATIVE
Staphylococcus aureus: POSITIVE — AB

## 2021-04-23 LAB — SARS CORONAVIRUS 2 (TAT 6-24 HRS): SARS Coronavirus 2: NEGATIVE

## 2021-04-23 NOTE — Progress Notes (Signed)
PCP - Tina Olsen Cardiologist - Denies  Chest x-ray - Not indicated EKG - Not indicated Stress Test - Denies ECHO - Denies Cardiac Cath - Denies  Sleep Study - Denies  DM - Denies  ERAS Protcol -No  COVID TEST- 04/23/21   Anesthesia review: No  Patient denies shortness of breath, fever, cough and chest pain at PAT appointment   All instructions explained to the patient, with a verbal understanding of the material. Patient agrees to go over the instructions while at home for a better understanding. Patient also instructed to wear a mask in public after being tested for COVID-19. The opportunity to ask questions was provided.

## 2021-05-08 ENCOUNTER — Encounter: Payer: BC Managed Care – PPO | Admitting: Gastroenterology

## 2021-05-09 DIAGNOSIS — E538 Deficiency of other specified B group vitamins: Secondary | ICD-10-CM | POA: Diagnosis not present

## 2021-05-09 DIAGNOSIS — F5222 Female sexual arousal disorder: Secondary | ICD-10-CM | POA: Diagnosis not present

## 2021-05-13 ENCOUNTER — Encounter (HOSPITAL_COMMUNITY): Payer: Self-pay | Admitting: Neurosurgery

## 2021-05-13 ENCOUNTER — Other Ambulatory Visit: Payer: Self-pay

## 2021-05-13 NOTE — Progress Notes (Signed)
Ms. Tina Olsen denies chest pain or shortness of breath. Patient denies any s/s of Covid in her home and is unaware of any exposures. Ms Tina Olsen will be tested for Covid on 05/14/21

## 2021-05-14 ENCOUNTER — Other Ambulatory Visit (HOSPITAL_COMMUNITY)
Admission: RE | Admit: 2021-05-14 | Discharge: 2021-05-14 | Disposition: A | Discharge status: Home / Self Care | Payer: BC Managed Care – PPO | Source: Ambulatory Visit | Attending: Neurosurgery | Admitting: Neurosurgery

## 2021-05-14 DIAGNOSIS — Z20822 Contact with and (suspected) exposure to covid-19: Secondary | ICD-10-CM | POA: Diagnosis not present

## 2021-05-14 DIAGNOSIS — M5416 Radiculopathy, lumbar region: Secondary | ICD-10-CM | POA: Diagnosis not present

## 2021-05-14 DIAGNOSIS — Z882 Allergy status to sulfonamides status: Secondary | ICD-10-CM | POA: Diagnosis not present

## 2021-05-14 DIAGNOSIS — Z79899 Other long term (current) drug therapy: Secondary | ICD-10-CM | POA: Diagnosis not present

## 2021-05-14 DIAGNOSIS — M4186 Other forms of scoliosis, lumbar region: Secondary | ICD-10-CM | POA: Diagnosis not present

## 2021-05-14 DIAGNOSIS — E039 Hypothyroidism, unspecified: Secondary | ICD-10-CM | POA: Diagnosis not present

## 2021-05-14 DIAGNOSIS — Z01812 Encounter for preprocedural laboratory examination: Secondary | ICD-10-CM | POA: Insufficient documentation

## 2021-05-14 DIAGNOSIS — M810 Age-related osteoporosis without current pathological fracture: Secondary | ICD-10-CM | POA: Diagnosis not present

## 2021-05-14 DIAGNOSIS — Z7983 Long term (current) use of bisphosphonates: Secondary | ICD-10-CM | POA: Diagnosis not present

## 2021-05-14 DIAGNOSIS — M069 Rheumatoid arthritis, unspecified: Secondary | ICD-10-CM | POA: Diagnosis not present

## 2021-05-14 DIAGNOSIS — G8929 Other chronic pain: Secondary | ICD-10-CM | POA: Diagnosis not present

## 2021-05-14 DIAGNOSIS — Z4889 Encounter for other specified surgical aftercare: Secondary | ICD-10-CM | POA: Diagnosis not present

## 2021-05-14 DIAGNOSIS — M48061 Spinal stenosis, lumbar region without neurogenic claudication: Secondary | ICD-10-CM | POA: Diagnosis not present

## 2021-05-14 DIAGNOSIS — M4326 Fusion of spine, lumbar region: Secondary | ICD-10-CM | POA: Diagnosis not present

## 2021-05-14 DIAGNOSIS — M5412 Radiculopathy, cervical region: Secondary | ICD-10-CM | POA: Diagnosis not present

## 2021-05-14 DIAGNOSIS — Z981 Arthrodesis status: Secondary | ICD-10-CM | POA: Diagnosis not present

## 2021-05-14 DIAGNOSIS — M25552 Pain in left hip: Secondary | ICD-10-CM | POA: Diagnosis not present

## 2021-05-14 DIAGNOSIS — M4726 Other spondylosis with radiculopathy, lumbar region: Secondary | ICD-10-CM | POA: Diagnosis not present

## 2021-05-14 DIAGNOSIS — M4316 Spondylolisthesis, lumbar region: Secondary | ICD-10-CM | POA: Diagnosis not present

## 2021-05-14 DIAGNOSIS — M5441 Lumbago with sciatica, right side: Secondary | ICD-10-CM | POA: Diagnosis not present

## 2021-05-14 DIAGNOSIS — R1110 Vomiting, unspecified: Secondary | ICD-10-CM | POA: Diagnosis not present

## 2021-05-15 ENCOUNTER — Encounter (HOSPITAL_COMMUNITY)
Admission: RE | Disposition: A | Discharge status: Home / Self Care | Payer: Self-pay | Source: Home / Self Care | Attending: Neurosurgery

## 2021-05-15 ENCOUNTER — Inpatient Hospital Stay (HOSPITAL_COMMUNITY): Payer: BC Managed Care – PPO | Admitting: Certified Registered"

## 2021-05-15 ENCOUNTER — Other Ambulatory Visit: Payer: Self-pay

## 2021-05-15 ENCOUNTER — Encounter (HOSPITAL_COMMUNITY): Payer: Self-pay | Admitting: Neurosurgery

## 2021-05-15 ENCOUNTER — Inpatient Hospital Stay (HOSPITAL_COMMUNITY): Payer: BC Managed Care – PPO

## 2021-05-15 ENCOUNTER — Inpatient Hospital Stay (HOSPITAL_COMMUNITY)
Admission: RE | Admit: 2021-05-15 | Discharge: 2021-05-16 | DRG: 460 | Disposition: A | Discharge status: Home / Self Care | Payer: BC Managed Care – PPO | Attending: Neurosurgery | Admitting: Neurosurgery

## 2021-05-15 DIAGNOSIS — Z419 Encounter for procedure for purposes other than remedying health state, unspecified: Secondary | ICD-10-CM

## 2021-05-15 DIAGNOSIS — M5441 Lumbago with sciatica, right side: Secondary | ICD-10-CM | POA: Diagnosis present

## 2021-05-15 DIAGNOSIS — Z79899 Other long term (current) drug therapy: Secondary | ICD-10-CM

## 2021-05-15 DIAGNOSIS — M069 Rheumatoid arthritis, unspecified: Secondary | ICD-10-CM | POA: Diagnosis present

## 2021-05-15 DIAGNOSIS — G8929 Other chronic pain: Secondary | ICD-10-CM | POA: Diagnosis present

## 2021-05-15 DIAGNOSIS — M4186 Other forms of scoliosis, lumbar region: Secondary | ICD-10-CM | POA: Diagnosis present

## 2021-05-15 DIAGNOSIS — M4726 Other spondylosis with radiculopathy, lumbar region: Secondary | ICD-10-CM | POA: Diagnosis present

## 2021-05-15 DIAGNOSIS — M4316 Spondylolisthesis, lumbar region: Principal | ICD-10-CM | POA: Diagnosis present

## 2021-05-15 DIAGNOSIS — E039 Hypothyroidism, unspecified: Secondary | ICD-10-CM | POA: Diagnosis present

## 2021-05-15 DIAGNOSIS — M48061 Spinal stenosis, lumbar region without neurogenic claudication: Secondary | ICD-10-CM | POA: Diagnosis present

## 2021-05-15 DIAGNOSIS — Z20822 Contact with and (suspected) exposure to covid-19: Secondary | ICD-10-CM | POA: Diagnosis present

## 2021-05-15 DIAGNOSIS — M5412 Radiculopathy, cervical region: Secondary | ICD-10-CM | POA: Diagnosis present

## 2021-05-15 DIAGNOSIS — R1110 Vomiting, unspecified: Secondary | ICD-10-CM | POA: Diagnosis not present

## 2021-05-15 DIAGNOSIS — Z7983 Long term (current) use of bisphosphonates: Secondary | ICD-10-CM

## 2021-05-15 DIAGNOSIS — Z882 Allergy status to sulfonamides status: Secondary | ICD-10-CM

## 2021-05-15 DIAGNOSIS — Z981 Arthrodesis status: Secondary | ICD-10-CM | POA: Diagnosis not present

## 2021-05-15 DIAGNOSIS — M4326 Fusion of spine, lumbar region: Secondary | ICD-10-CM | POA: Diagnosis not present

## 2021-05-15 DIAGNOSIS — M25552 Pain in left hip: Secondary | ICD-10-CM | POA: Diagnosis not present

## 2021-05-15 DIAGNOSIS — M419 Scoliosis, unspecified: Secondary | ICD-10-CM | POA: Diagnosis present

## 2021-05-15 DIAGNOSIS — M810 Age-related osteoporosis without current pathological fracture: Secondary | ICD-10-CM | POA: Diagnosis present

## 2021-05-15 DIAGNOSIS — Z4889 Encounter for other specified surgical aftercare: Secondary | ICD-10-CM | POA: Diagnosis not present

## 2021-05-15 HISTORY — PX: ANTERIOR LAT LUMBAR FUSION: SHX1168

## 2021-05-15 HISTORY — PX: LUMBAR PERCUTANEOUS PEDICLE SCREW 2 LEVEL: SHX5561

## 2021-05-15 HISTORY — DX: Headache, unspecified: R51.9

## 2021-05-15 HISTORY — DX: Malignant (primary) neoplasm, unspecified: C80.1

## 2021-05-15 LAB — TYPE AND SCREEN
ABO/RH(D): B POS
Antibody Screen: NEGATIVE

## 2021-05-15 LAB — SARS CORONAVIRUS 2 (TAT 6-24 HRS): SARS Coronavirus 2: NEGATIVE

## 2021-05-15 SURGERY — ANTERIOR LATERAL LUMBAR FUSION 2 LEVELS
Anesthesia: General | Site: Flank | Laterality: Right

## 2021-05-15 MED ORDER — SCOPOLAMINE 1 MG/3DAYS TD PT72
MEDICATED_PATCH | TRANSDERMAL | Status: AC
  Filled 2021-05-15: qty 1

## 2021-05-15 MED ORDER — LORATADINE 10 MG PO TABS
10.0000 mg | ORAL_TABLET | Freq: Every day | ORAL | Status: DC
  Administered 2021-05-16: 10 mg via ORAL
  Filled 2021-05-15: qty 1

## 2021-05-15 MED ORDER — ROCURONIUM BROMIDE 10 MG/ML (PF) SYRINGE
PREFILLED_SYRINGE | INTRAVENOUS | Status: DC | PRN
  Administered 2021-05-15: 50 mg via INTRAVENOUS

## 2021-05-15 MED ORDER — OXYCODONE HCL 5 MG PO TABS
10.0000 mg | ORAL_TABLET | ORAL | Status: DC | PRN
  Administered 2021-05-15 – 2021-05-16 (×4): 10 mg via ORAL
  Filled 2021-05-15 (×4): qty 2

## 2021-05-15 MED ORDER — BIOTIN 5 MG PO CAPS: 5.0000 mg | ORAL_CAPSULE | Freq: Every day | ORAL | Status: DC

## 2021-05-15 MED ORDER — KETOROLAC TROMETHAMINE 15 MG/ML IJ SOLN
INTRAMUSCULAR | Status: AC
  Filled 2021-05-15: qty 1

## 2021-05-15 MED ORDER — DOCUSATE SODIUM 100 MG PO CAPS
100.0000 mg | ORAL_CAPSULE | Freq: Two times a day (BID) | ORAL | Status: DC
  Administered 2021-05-15 – 2021-05-16 (×2): 100 mg via ORAL
  Filled 2021-05-15 (×2): qty 1

## 2021-05-15 MED ORDER — OXYCODONE HCL 5 MG PO TABS
ORAL_TABLET | ORAL | Status: AC
  Filled 2021-05-15: qty 1

## 2021-05-15 MED ORDER — CHLORHEXIDINE GLUCONATE CLOTH 2 % EX PADS: 6.0000 | MEDICATED_PAD | Freq: Once | CUTANEOUS | Status: DC

## 2021-05-15 MED ORDER — ACETAMINOPHEN 10 MG/ML IV SOLN
1000.0000 mg | Freq: Once | INTRAVENOUS | Status: DC | PRN
  Administered 2021-05-15: 1000 mg via INTRAVENOUS

## 2021-05-15 MED ORDER — DEXAMETHASONE SODIUM PHOSPHATE 10 MG/ML IJ SOLN
INTRAMUSCULAR | Status: DC | PRN
  Administered 2021-05-15: 10 mg via INTRAVENOUS

## 2021-05-15 MED ORDER — METHOCARBAMOL 500 MG PO TABS
500.0000 mg | ORAL_TABLET | Freq: Four times a day (QID) | ORAL | Status: DC | PRN
  Administered 2021-05-15 – 2021-05-16 (×3): 500 mg via ORAL
  Filled 2021-05-15 (×2): qty 1

## 2021-05-15 MED ORDER — THROMBIN 5000 UNITS EX SOLR: OROMUCOSAL | Status: DC | PRN

## 2021-05-15 MED ORDER — ONDANSETRON HCL 4 MG/2ML IJ SOLN
INTRAMUSCULAR | Status: DC | PRN
  Administered 2021-05-15: 4 mg via INTRAVENOUS

## 2021-05-15 MED ORDER — DEXAMETHASONE SODIUM PHOSPHATE 10 MG/ML IJ SOLN
INTRAMUSCULAR | Status: AC
  Filled 2021-05-15: qty 1

## 2021-05-15 MED ORDER — OXYCODONE HCL 5 MG/5ML PO SOLN: 5.0000 mg | Freq: Once | ORAL | Status: AC | PRN

## 2021-05-15 MED ORDER — METHOCARBAMOL 500 MG PO TABS
ORAL_TABLET | ORAL | Status: AC
  Filled 2021-05-15: qty 1

## 2021-05-15 MED ORDER — MIDAZOLAM HCL 2 MG/2ML IJ SOLN
INTRAMUSCULAR | Status: DC | PRN
  Administered 2021-05-15: 2 mg via INTRAVENOUS

## 2021-05-15 MED ORDER — THROMBIN 5000 UNITS EX SOLR
CUTANEOUS | Status: AC
  Filled 2021-05-15: qty 5000

## 2021-05-15 MED ORDER — PHENOL 1.4 % MT LIQD: 1.0000 | OROMUCOSAL | Status: DC | PRN

## 2021-05-15 MED ORDER — HYDROCODONE-ACETAMINOPHEN 5-325 MG PO TABS: 2.0000 | ORAL_TABLET | ORAL | Status: DC | PRN

## 2021-05-15 MED ORDER — OXYCODONE HCL 5 MG PO TABS
5.0000 mg | ORAL_TABLET | Freq: Once | ORAL | Status: AC | PRN
  Administered 2021-05-15: 5 mg via ORAL

## 2021-05-15 MED ORDER — PANTOPRAZOLE SODIUM 40 MG IV SOLR: 40.0000 mg | Freq: Every day | INTRAVENOUS | Status: DC

## 2021-05-15 MED ORDER — BUPIVACAINE LIPOSOME 1.3 % IJ SUSP
INTRAMUSCULAR | Status: DC | PRN
  Administered 2021-05-15: 20 mL

## 2021-05-15 MED ORDER — ONDANSETRON HCL 4 MG/2ML IJ SOLN
4.0000 mg | Freq: Four times a day (QID) | INTRAMUSCULAR | Status: DC | PRN
  Administered 2021-05-15: 4 mg via INTRAVENOUS
  Filled 2021-05-15: qty 2

## 2021-05-15 MED ORDER — ALUM & MAG HYDROXIDE-SIMETH 200-200-20 MG/5ML PO SUSP: 30.0000 mL | Freq: Four times a day (QID) | ORAL | Status: DC | PRN

## 2021-05-15 MED ORDER — ALBUMIN HUMAN 5 % IV SOLN: INTRAVENOUS | Status: DC | PRN

## 2021-05-15 MED ORDER — KCL IN DEXTROSE-NACL 20-5-0.45 MEQ/L-%-% IV SOLN: INTRAVENOUS | Status: DC

## 2021-05-15 MED ORDER — POLYETHYLENE GLYCOL 3350 17 G PO PACK: 17.0000 g | PACK | Freq: Every day | ORAL | Status: DC | PRN

## 2021-05-15 MED ORDER — PHENYLEPHRINE 40 MCG/ML (10ML) SYRINGE FOR IV PUSH (FOR BLOOD PRESSURE SUPPORT)
PREFILLED_SYRINGE | INTRAVENOUS | Status: AC
  Filled 2021-05-15: qty 10

## 2021-05-15 MED ORDER — ZOLPIDEM TARTRATE 5 MG PO TABS: 5.0000 mg | ORAL_TABLET | Freq: Every evening | ORAL | Status: DC | PRN

## 2021-05-15 MED ORDER — 0.9 % SODIUM CHLORIDE (POUR BTL) OPTIME
TOPICAL | Status: DC | PRN
  Administered 2021-05-15: 1000 mL

## 2021-05-15 MED ORDER — LIDOCAINE 2% (20 MG/ML) 5 ML SYRINGE
INTRAMUSCULAR | Status: DC | PRN
  Administered 2021-05-15: 100 mg via INTRAVENOUS

## 2021-05-15 MED ORDER — ESTRADIOL 1 MG PO TABS
1.0000 mg | ORAL_TABLET | Freq: Every day | ORAL | Status: DC
  Administered 2021-05-16: 1 mg via ORAL
  Filled 2021-05-15: qty 1

## 2021-05-15 MED ORDER — ACETAMINOPHEN 325 MG PO TABS
650.0000 mg | ORAL_TABLET | ORAL | Status: DC | PRN
  Administered 2021-05-15: 650 mg via ORAL
  Filled 2021-05-15 (×2): qty 2

## 2021-05-15 MED ORDER — LEVOTHYROXINE SODIUM 75 MCG PO TABS
75.0000 ug | ORAL_TABLET | Freq: Every day | ORAL | Status: DC
  Administered 2021-05-16: 75 ug via ORAL
  Filled 2021-05-15: qty 1

## 2021-05-15 MED ORDER — BUPIVACAINE LIPOSOME 1.3 % IJ SUSP
INTRAMUSCULAR | Status: AC
  Filled 2021-05-15: qty 20

## 2021-05-15 MED ORDER — SUGAMMADEX SODIUM 200 MG/2ML IV SOLN
INTRAVENOUS | Status: DC | PRN
  Administered 2021-05-15: 200 mg via INTRAVENOUS

## 2021-05-15 MED ORDER — HYDROMORPHONE HCL 1 MG/ML IJ SOLN
0.2500 mg | INTRAMUSCULAR | Status: DC | PRN
  Administered 2021-05-15 (×3): 0.5 mg via INTRAVENOUS

## 2021-05-15 MED ORDER — HYDROMORPHONE HCL 1 MG/ML IJ SOLN
INTRAMUSCULAR | Status: AC
  Filled 2021-05-15: qty 1

## 2021-05-15 MED ORDER — ONDANSETRON HCL 4 MG/2ML IJ SOLN
INTRAMUSCULAR | Status: AC
  Filled 2021-05-15: qty 2

## 2021-05-15 MED ORDER — BISACODYL 10 MG RE SUPP: 10.0000 mg | Freq: Every day | RECTAL | Status: DC | PRN

## 2021-05-15 MED ORDER — MIDAZOLAM HCL 2 MG/2ML IJ SOLN
INTRAMUSCULAR | Status: AC
  Filled 2021-05-15: qty 2

## 2021-05-15 MED ORDER — SODIUM CHLORIDE (PF) 0.9 % IJ SOLN
INTRAMUSCULAR | Status: AC
  Filled 2021-05-15: qty 10

## 2021-05-15 MED ORDER — LACTATED RINGERS IV SOLN: INTRAVENOUS | Status: DC

## 2021-05-15 MED ORDER — PROPOFOL 10 MG/ML IV BOLUS
INTRAVENOUS | Status: AC
  Filled 2021-05-15: qty 20

## 2021-05-15 MED ORDER — EPHEDRINE 5 MG/ML INJ
INTRAVENOUS | Status: AC
  Filled 2021-05-15: qty 10

## 2021-05-15 MED ORDER — OXYCODONE HCL 5 MG PO TABS: 5.0000 mg | ORAL_TABLET | ORAL | Status: DC | PRN

## 2021-05-15 MED ORDER — PHENYLEPHRINE HCL-NACL 10-0.9 MG/250ML-% IV SOLN
INTRAVENOUS | Status: DC | PRN
  Administered 2021-05-15: 50 ug/min via INTRAVENOUS

## 2021-05-15 MED ORDER — SUFENTANIL CITRATE 50 MCG/ML IV SOLN
INTRAVENOUS | Status: DC | PRN
  Administered 2021-05-15 (×3): 10 ug via INTRAVENOUS

## 2021-05-15 MED ORDER — METHOCARBAMOL 1000 MG/10ML IJ SOLN
500.0000 mg | Freq: Four times a day (QID) | INTRAMUSCULAR | Status: DC | PRN
  Filled 2021-05-15 (×2): qty 5

## 2021-05-15 MED ORDER — LIDOCAINE-EPINEPHRINE 1 %-1:100000 IJ SOLN
INTRAMUSCULAR | Status: AC
  Filled 2021-05-15: qty 1

## 2021-05-15 MED ORDER — MENTHOL 3 MG MT LOZG: 1.0000 | LOZENGE | OROMUCOSAL | Status: DC | PRN

## 2021-05-15 MED ORDER — PANTOPRAZOLE SODIUM 40 MG PO TBEC
40.0000 mg | DELAYED_RELEASE_TABLET | Freq: Every day | ORAL | Status: DC
  Administered 2021-05-15 – 2021-05-16 (×2): 40 mg via ORAL
  Filled 2021-05-15 (×2): qty 1

## 2021-05-15 MED ORDER — ALENDRONATE SODIUM 70 MG PO TABS: 70.0000 mg | ORAL_TABLET | ORAL | Status: DC

## 2021-05-15 MED ORDER — KETOROLAC TROMETHAMINE 15 MG/ML IJ SOLN
15.0000 mg | Freq: Four times a day (QID) | INTRAMUSCULAR | Status: AC
  Administered 2021-05-15 – 2021-05-16 (×4): 15 mg via INTRAVENOUS
  Filled 2021-05-15 (×3): qty 1

## 2021-05-15 MED ORDER — LACTATED RINGERS IV SOLN: INTRAVENOUS | Status: DC | PRN

## 2021-05-15 MED ORDER — CEFAZOLIN SODIUM-DEXTROSE 2-4 GM/100ML-% IV SOLN
2.0000 g | INTRAVENOUS | Status: AC
  Administered 2021-05-15: 2 g via INTRAVENOUS
  Filled 2021-05-15: qty 100

## 2021-05-15 MED ORDER — LIDOCAINE-EPINEPHRINE 1 %-1:100000 IJ SOLN
INTRAMUSCULAR | Status: DC | PRN
  Administered 2021-05-15: 12.5 mL
  Administered 2021-05-15: 8.5 mL

## 2021-05-15 MED ORDER — ROCURONIUM BROMIDE 10 MG/ML (PF) SYRINGE
PREFILLED_SYRINGE | INTRAVENOUS | Status: AC
  Filled 2021-05-15: qty 10

## 2021-05-15 MED ORDER — ACETAMINOPHEN 650 MG RE SUPP: 650.0000 mg | RECTAL | Status: DC | PRN

## 2021-05-15 MED ORDER — HYDROMORPHONE HCL 1 MG/ML IJ SOLN
0.5000 mg | INTRAMUSCULAR | Status: DC | PRN
  Administered 2021-05-15: 0.5 mg via INTRAVENOUS
  Filled 2021-05-15: qty 0.5

## 2021-05-15 MED ORDER — BUPIVACAINE HCL (PF) 0.5 % IJ SOLN
INTRAMUSCULAR | Status: AC
  Filled 2021-05-15: qty 30

## 2021-05-15 MED ORDER — CEFAZOLIN SODIUM-DEXTROSE 2-4 GM/100ML-% IV SOLN
2.0000 g | Freq: Three times a day (TID) | INTRAVENOUS | Status: AC
  Administered 2021-05-15 (×2): 2 g via INTRAVENOUS
  Filled 2021-05-15 (×2): qty 100

## 2021-05-15 MED ORDER — SODIUM CHLORIDE 0.9% FLUSH: 3.0000 mL | INTRAVENOUS | Status: DC | PRN

## 2021-05-15 MED ORDER — CYANOCOBALAMIN 1000 MCG/ML IJ SOLN: 1000.0000 ug | INTRAMUSCULAR | Status: DC

## 2021-05-15 MED ORDER — ACETAMINOPHEN 10 MG/ML IV SOLN
INTRAVENOUS | Status: AC
  Filled 2021-05-15: qty 100

## 2021-05-15 MED ORDER — CHLORHEXIDINE GLUCONATE 0.12 % MT SOLN
15.0000 mL | Freq: Once | OROMUCOSAL | Status: AC
  Administered 2021-05-15: 15 mL via OROMUCOSAL
  Filled 2021-05-15: qty 15

## 2021-05-15 MED ORDER — FLEET ENEMA 7-19 GM/118ML RE ENEM: 1.0000 | ENEMA | Freq: Once | RECTAL | Status: DC | PRN

## 2021-05-15 MED ORDER — PROPOFOL 10 MG/ML IV BOLUS
INTRAVENOUS | Status: DC | PRN
  Administered 2021-05-15: 150 mg via INTRAVENOUS

## 2021-05-15 MED ORDER — PROMETHAZINE HCL 25 MG/ML IJ SOLN: 6.2500 mg | INTRAMUSCULAR | Status: DC | PRN

## 2021-05-15 MED ORDER — SODIUM CHLORIDE 0.9 % IV SOLN: 250.0000 mL | INTRAVENOUS | Status: DC

## 2021-05-15 MED ORDER — ORAL CARE MOUTH RINSE: 15.0000 mL | Freq: Once | OROMUCOSAL | Status: AC

## 2021-05-15 MED ORDER — PHENYLEPHRINE HCL (PRESSORS) 10 MG/ML IV SOLN
INTRAVENOUS | Status: DC | PRN
  Administered 2021-05-15 (×3): 80 ug via INTRAVENOUS

## 2021-05-15 MED ORDER — SUFENTANIL CITRATE 50 MCG/ML IV SOLN
INTRAVENOUS | Status: AC
  Filled 2021-05-15: qty 1

## 2021-05-15 MED ORDER — EPHEDRINE SULFATE 50 MG/ML IJ SOLN
INTRAMUSCULAR | Status: DC | PRN
  Administered 2021-05-15: 10 mg via INTRAVENOUS
  Administered 2021-05-15: 5 mg via INTRAVENOUS
  Administered 2021-05-15 (×2): 10 mg via INTRAVENOUS
  Administered 2021-05-15: 15 mg via INTRAVENOUS

## 2021-05-15 MED ORDER — SODIUM CHLORIDE 0.9% FLUSH: 3.0000 mL | Freq: Two times a day (BID) | INTRAVENOUS | Status: DC

## 2021-05-15 MED ORDER — VITAMIN D 25 MCG (1000 UNIT) PO TABS
2000.0000 [IU] | ORAL_TABLET | Freq: Every day | ORAL | Status: DC
  Filled 2021-05-15 (×2): qty 2

## 2021-05-15 MED ORDER — LIDOCAINE 2% (20 MG/ML) 5 ML SYRINGE
INTRAMUSCULAR | Status: AC
  Filled 2021-05-15: qty 5

## 2021-05-15 MED ORDER — ONDANSETRON HCL 4 MG PO TABS: 4.0000 mg | ORAL_TABLET | Freq: Four times a day (QID) | ORAL | Status: DC | PRN

## 2021-05-15 MED ORDER — BUPIVACAINE HCL (PF) 0.5 % IJ SOLN
INTRAMUSCULAR | Status: DC | PRN
  Administered 2021-05-15: 12.5 mL
  Administered 2021-05-15: 8.5 mL

## 2021-05-15 SURGICAL SUPPLY — 86 items
BAG COUNTER SPONGE SURGICOUNT (BAG) ×16 IMPLANT
BLADE CLIPPER SURG (BLADE) IMPLANT
CAGE MODULUS XL 8X18X45 - 10 (Cage) ×4 IMPLANT
CAGE MODULUS XL 8X18X50 - 10 (Cage) ×4 IMPLANT
CARTRIDGE OIL MAESTRO DRILL (MISCELLANEOUS) IMPLANT
CLIP PULSE STIMULATION (NEUROSURGERY SUPPLIES) ×8 IMPLANT
CNTNR URN SCR LID CUP LEK RST (MISCELLANEOUS) IMPLANT
CONT SPEC 4OZ STRL OR WHT (MISCELLANEOUS)
COVER BACK TABLE 60X90IN (DRAPES) ×4 IMPLANT
DECANTER SPIKE VIAL GLASS SM (MISCELLANEOUS) IMPLANT
DERMABOND ADVANCED (GAUZE/BANDAGES/DRESSINGS) ×2
DERMABOND ADVANCED .7 DNX12 (GAUZE/BANDAGES/DRESSINGS) ×6 IMPLANT
DIFFUSER DRILL AIR PNEUMATIC (MISCELLANEOUS) IMPLANT
DRAPE C-ARM 42X72 X-RAY (DRAPES) ×8 IMPLANT
DRAPE C-ARMOR (DRAPES) ×8 IMPLANT
DRAPE LAPAROTOMY 100X72X124 (DRAPES) ×8 IMPLANT
DRAPE PULSE TABLE ARRAY (DRAPES) ×8 IMPLANT
DRAPE SURG 17X23 STRL (DRAPES) ×4 IMPLANT
DRSG OPSITE POSTOP 3X4 (GAUZE/BANDAGES/DRESSINGS) ×4 IMPLANT
DRSG OPSITE POSTOP 4X6 (GAUZE/BANDAGES/DRESSINGS) ×4 IMPLANT
DRSG OPSITE POSTOP 4X8 (GAUZE/BANDAGES/DRESSINGS) ×8 IMPLANT
DURAPREP 26ML APPLICATOR (WOUND CARE) ×8 IMPLANT
ELECT REM PT RETURN 9FT ADLT (ELECTROSURGICAL) ×8
ELECTRODE REM PT RTRN 9FT ADLT (ELECTROSURGICAL) ×6 IMPLANT
GAUZE 4X4 16PLY ~~LOC~~+RFID DBL (SPONGE) ×8 IMPLANT
GAUZE SPONGE 4X4 12PLY STRL (GAUZE/BANDAGES/DRESSINGS) IMPLANT
GLOVE EXAM NITRILE XL STR (GLOVE) IMPLANT
GLOVE SRG 8 PF TXTR STRL LF DI (GLOVE) ×12 IMPLANT
GLOVE SURG ENC MOIS LTX SZ8 (GLOVE) ×12 IMPLANT
GLOVE SURG LTX SZ7.5 (GLOVE) ×16 IMPLANT
GLOVE SURG LTX SZ8 (GLOVE) ×8 IMPLANT
GLOVE SURG POLYISO LF SZ7 (GLOVE) ×12 IMPLANT
GLOVE SURG UNDER POLY LF SZ7.5 (GLOVE) ×8 IMPLANT
GLOVE SURG UNDER POLY LF SZ8 (GLOVE) ×4
GLOVE SURG UNDER POLY LF SZ8.5 (GLOVE) ×12 IMPLANT
GOWN STRL REUS W/ TWL LRG LVL3 (GOWN DISPOSABLE) IMPLANT
GOWN STRL REUS W/ TWL XL LVL3 (GOWN DISPOSABLE) ×12 IMPLANT
GOWN STRL REUS W/TWL 2XL LVL3 (GOWN DISPOSABLE) ×12 IMPLANT
GOWN STRL REUS W/TWL LRG LVL3 (GOWN DISPOSABLE)
GOWN STRL REUS W/TWL XL LVL3 (GOWN DISPOSABLE) ×4
GUIDEWIRE NITINOL BEVEL TIP (WIRE) ×24 IMPLANT
HEMOSTAT POWDER KIT SURGIFOAM (HEMOSTASIS) ×4 IMPLANT
KIT BASIN OR (CUSTOM PROCEDURE TRAY) ×4 IMPLANT
KIT DILATOR XLIF 5 (KITS) ×3 IMPLANT
KIT INFUSE X SMALL 1.4CC (Orthopedic Implant) ×4 IMPLANT
KIT POSITION SURG JACKSON T1 (MISCELLANEOUS) ×4 IMPLANT
KIT PULSE EMG NEEDLE (NEUROSURGERY SUPPLIES) IMPLANT
KIT PULSE EMG SURFACE (NEUROSURGERY SUPPLIES) ×4 IMPLANT
KIT PULSE MIOM NEEDLE (NEUROSURGERY SUPPLIES) IMPLANT
KIT PULSE MIOM SURFACE (NEUROSURGERY SUPPLIES) IMPLANT
KIT PULSE XLIF (NEUROSURGERY SUPPLIES) IMPLANT
KIT SURGICAL ACCESS MAXCESS 4 (KITS) ×4 IMPLANT
KIT TURNOVER KIT B (KITS) ×4 IMPLANT
KIT XLIF (KITS) ×1
MARKER SKIN DUAL TIP RULER LAB (MISCELLANEOUS) ×8 IMPLANT
NEEDLE HYPO 21X1.5 SAFETY (NEEDLE) ×4 IMPLANT
NEEDLE HYPO 25X1 1.5 SAFETY (NEEDLE) ×8 IMPLANT
NEEDLE I PASS (NEEDLE) ×4 IMPLANT
NS IRRIG 1000ML POUR BTL (IV SOLUTION) ×4 IMPLANT
OIL CARTRIDGE MAESTRO DRILL (MISCELLANEOUS)
PACK LAMINECTOMY NEURO (CUSTOM PROCEDURE TRAY) ×8 IMPLANT
PAD ARMBOARD 7.5X6 YLW CONV (MISCELLANEOUS) ×16 IMPLANT
PATTIES SURGICAL .5 X.5 (GAUZE/BANDAGES/DRESSINGS) IMPLANT
PATTIES SURGICAL .5 X1 (DISPOSABLE) IMPLANT
PATTIES SURGICAL 1X1 (DISPOSABLE) IMPLANT
PROBE PULSE STIMULATION (NEUROSURGERY SUPPLIES) ×4 IMPLANT
PUTTY BONE ATTRAX 10CC STRIP (Putty) ×4 IMPLANT
ROD RELINE MAS LORD 5.5X75MM (Rod) ×4 IMPLANT
ROD RELINE TI MAS LD 5.5X65 (Rod) ×4 IMPLANT
RUBBER BAND PULSE STRL (MISCELLANEOUS) ×8 IMPLANT
SCREW LOCK RELINE 5.5 TULIP (Screw) ×24 IMPLANT
SCREW MAS RELINE POLY 6.5X40 (Screw) ×8 IMPLANT
SCREW RELINE MAS POLY 5.5X35MM (Screw) ×4 IMPLANT
SCREW RELINE MAS POLY 5.5X40 (Screw) ×12 IMPLANT
SPONGE T-LAP 4X18 ~~LOC~~+RFID (SPONGE) ×8 IMPLANT
STAPLER SKIN PROX WIDE 3.9 (STAPLE) ×4 IMPLANT
SUT VIC AB 1 CT1 18XBRD ANBCTR (SUTURE) ×9 IMPLANT
SUT VIC AB 1 CT1 8-18 (SUTURE) ×3
SUT VIC AB 2-0 CT1 18 (SUTURE) ×12 IMPLANT
SUT VIC AB 3-0 SH 8-18 (SUTURE) ×12 IMPLANT
SYR 20ML LL LF (SYRINGE) ×4 IMPLANT
SYR TB 1ML 25GX5/8 (SYRINGE) IMPLANT
TOWEL GREEN STERILE (TOWEL DISPOSABLE) ×8 IMPLANT
TOWEL GREEN STERILE FF (TOWEL DISPOSABLE) ×8 IMPLANT
TRAY FOLEY MTR SLVR 16FR STAT (SET/KITS/TRAYS/PACK) ×8 IMPLANT
WATER STERILE IRR 1000ML POUR (IV SOLUTION) ×8 IMPLANT

## 2021-05-15 NOTE — Progress Notes (Signed)
PHARMACIST - PHYSICIAN ORDER COMMUNICATION  CONCERNING: P&T Medication Policy on Herbal Medications  DESCRIPTION:  This patient's order for:  biotin  has been noted.  This product(s) is classified as an "herbal" or natural product. Due to a lack of definitive safety studies or FDA approval, nonstandard manufacturing practices, plus the potential risk of unknown drug-drug interactions while on inpatient medications, the Pharmacy and Therapeutics Committee does not permit the use of "herbal" or natural products of this type within [facility].   ACTION TAKEN: The pharmacy department is unable to verify this order at this time and your patient has been informed of this safety policy. Please reevaluate patient's clinical condition at discharge and address if the herbal or natural product(s) should be resumed at that time.    Tina Olsen A. Levada Dy, PharmD, BCPS, FNKF Clinical Pharmacist [place] Please utilize Amion for appropriate phone number to reach the unit pharmacist ([place])

## 2021-05-15 NOTE — Transfer of Care (Signed)
Immediate Anesthesia Transfer of Care Note  Patient: Tina Olsen  Procedure(s) Performed: Right Lumbar three-four, Lumbar four-five Anterolateral lumbar interbody fusion (Right: Flank) Percutaneous pedicle screw fixation Lumbar three-five (Back) APPLICATION OF PULSE  Patient Location: PACU  Anesthesia Type:General  Level of Consciousness: drowsy, patient cooperative and responds to stimulation  Airway & Oxygen Therapy: Patient Spontanous Breathing and Patient connected to nasal cannula oxygen  Post-op Assessment: Report given to RN, Post -op Vital signs reviewed and stable and Patient moving all extremities X 4  Post vital signs: Reviewed and stable  Last Vitals:  Vitals Value Taken Time  BP 100/63 05/15/21 1423  Temp    Pulse 93 05/15/21 1427  Resp 25 05/15/21 1427  SpO2 100 % 05/15/21 1427  Vitals shown include unvalidated device data.  Last Pain:  Vitals:   05/15/21 0719  TempSrc:   PainSc: 7       Patients Stated Pain Goal: 3 (44/61/90 1222)  Complications: No notable events documented.

## 2021-05-15 NOTE — Brief Op Note (Signed)
05/15/2021  2:15 PM  PATIENT:  Tina Olsen  60 y.o. female  PRE-OPERATIVE DIAGNOSIS:  Spondylolisthesis, Lumbar region, scoliosis, lumbar foraminal stenosis, lumbago, lumbar radiculopathy L 34 and L 45 levels  POST-OPERATIVE DIAGNOSIS:  Spondylolisthesis, Lumbar region, scoliosis, lumbar foraminal stenosis, lumbago, lumbar radiculopathy L 34 and L 45 levels  PROCEDURE:  Procedure(s): Right Lumbar three-four, Lumbar four-five Anterolateral lumbar interbody fusion (Right) Percutaneous pedicle screw fixation Lumbar three-five (N/A) APPLICATION OF PULSE (N/A)   SURGEON:  Surgeon(s) and Role:    Erline Levine, MD - Primary    * Dawley, Theodoro Doing, DO - Assisting  PHYSICIAN ASSISTANT:   ASSISTANTS: Poteat, RN   ANESTHESIA:   general  EBL:  20 mL   BLOOD ADMINISTERED:none  DRAINS: none   LOCAL MEDICATIONS USED:  MARCAINE    and LIDOCAINE   SPECIMEN:  No Specimen  DISPOSITION OF SPECIMEN:  N/A  COUNTS:  YES  TOURNIQUET:  * No tourniquets in log *  DICTATION: Patient is a 60 year old with severe spondylosis stenosis and scoliosis of the lumbar spine. It was elected to take her to surgery for anterolateral decompression and posterior percutaneous pedicle screw fixation.    Procedure: Patient was brought to the operating room and placed in a lateral decubitus position on the operative table and using orthogonally projected C-arm fluoroscopy the patient was placed so that the L3-4 and L 4 5 levels were visualized in AP and lateral plane using Pulse. The patient was then taped into position. Skin was marked along with a posterior finger dissection incision. Her right flank was then prepped and draped in usual sterile fashion and incisions were made sequentially at L 45 and  L3-4 levels. Posterior finger dissection was made to enter the retroperitoneal space and then subsequently the probe was inserted into the psoas muscle from the right side initially at the L 45 level. After  mapping the neural elements were able to dock the probe per the midpoint of this vertebral level and without indications electrically of too close proximity to the neural tissues. Subsequently the self-retaining tractor was.after sequential dilators were utilized the shim was employed and the interspace was cleared of psoas muscle and then incised. A thorough discectomy was performed. Angled instruments were used to clear the interspace of disc material.An anterior entry with posterior trajectory was performed to avoid neural elements.   After thorough discectomy was performed and this was performed using AP and lateral fluoroscopy an 8 lordotic by 50 x 18 mm titanium implant was packed with extra small BMP and Attrax. This was tamped into position and its position was confirmed on AP and lateral fluoroscopy. Subsequently exposure was performed at the L3-4 level and similar dissection was performed with locking of the self-retaining retractor. At this level were able to place an 8 lordotic  by 18 x 45 mm titanium implant packed in a similar fashion.  Hemostasis was assured the wounds were irrigated and closed with interrupted Vicryl sutures.  Sterile occlusive dressings were placed. Retractor times were:  L 45: 13 minutes;  L 34: 16 minutes.   Patient was then turned into a prone position on the [place] operating table and using AP and lateral fluoroscopy throughout this portion of the procedure, pedicle screws were placed using Reline Nuvasive cannulated percutaneous screws. 2 screws were placed at L 3 and (5.5 x 4 mm left and 5.5 x 35 mm right) and 2 at L 4 (5.5 x 40), and  2 at L5  (6.5  x 40). 75 mm rod was then affixed to the screw heads do a separate stab incision and locked down on the screws on the left and 65 mm rod on the right. All connections were then torqued and the Towers were disassembled. The wounds were irrigated and then closed with 1, 2-0 and 3-0 Vicryl stitches. Sterile occlusive dressing was  placed with Dermabond and occlusive dressings.  Long-acting Marcaine was injected. The patient was then extubated in the operating room and taken to recovery in stable and satisfactory condition having tolerated her operation well. Counts were correct at the end of the case.  Pelvic Parameters:  Preop: PT 12; PI 43; LL39; PI-LL 4 SVA 32 mm  PLAN OF CARE: Admit to inpatient   PATIENT DISPOSITION:  PACU - hemodynamically stable.   Delay start of Pharmacological VTE agent (>24hrs) due to surgical blood loss or risk of bleeding: yes

## 2021-05-15 NOTE — Anesthesia Postprocedure Evaluation (Signed)
Anesthesia Post Note  Patient: Tina Olsen  Procedure(s) Performed: Right Lumbar three-four, Lumbar four-five Anterolateral lumbar interbody fusion (Right: Flank) Percutaneous pedicle screw fixation Lumbar three-five (Back) APPLICATION OF PULSE     Patient location during evaluation: PACU Anesthesia Type: General Level of consciousness: awake and alert Pain management: pain level controlled Vital Signs Assessment: post-procedure vital signs reviewed and stable Respiratory status: spontaneous breathing, nonlabored ventilation, respiratory function stable and patient connected to nasal cannula oxygen Cardiovascular status: blood pressure returned to baseline and stable Postop Assessment: no apparent nausea or vomiting Anesthetic complications: no   No notable events documented.  Last Vitals:  Vitals:   05/15/21 1505 05/15/21 1520  BP:  101/66  Pulse: 81 84  Resp: 19 14  Temp:  (!) 36.2 C  SpO2: 100% 99%    Last Pain:  Vitals:   05/15/21 1520  TempSrc:   PainSc: 6     LLE Motor Response: Purposeful movement (05/15/21 1520) LLE Sensation: Full sensation (05/15/21 1520) RLE Motor Response: Purposeful movement (05/15/21 1520) RLE Sensation: Full sensation (05/15/21 1520)      Joscelyne Renville S

## 2021-05-15 NOTE — Interval H&P Note (Signed)
History and Physical Interval Note:  05/15/2021 7:29 AM  Tina Olsen  has presented today for surgery, with the diagnosis of Spondylolisthesis, Lumbar region.  The various methods of treatment have been discussed with the patient and family. After consideration of risks, benefits and other options for treatment, the patient has consented to  Procedure(s): Right Lumbar 3-4, Lumbar 4-5 Anterolateral lumbar interbody fusion (Right) Percutaneous pedicle screw fixation (N/A) APPLICATION OF PULSE (N/A) as a surgical intervention.  The patient's history has been reviewed, patient examined, no change in status, stable for surgery.  I have reviewed the patient's chart and labs.  Questions were answered to the patient's satisfaction.     Peggyann Shoals

## 2021-05-15 NOTE — Op Note (Signed)
05/15/2021  2:15 PM  PATIENT:  Tina Olsen  60 y.o. female  PRE-OPERATIVE DIAGNOSIS:  Spondylolisthesis, Lumbar region, scoliosis, lumbar foraminal stenosis, lumbago, lumbar radiculopathy L 34 and L 45 levels  POST-OPERATIVE DIAGNOSIS:  Spondylolisthesis, Lumbar region, scoliosis, lumbar foraminal stenosis, lumbago, lumbar radiculopathy L 34 and L 45 levels  PROCEDURE:  Procedure(s): Right Lumbar three-four, Lumbar four-five Anterolateral lumbar interbody fusion (Right) Percutaneous pedicle screw fixation Lumbar three-five (N/A) APPLICATION OF PULSE (N/A)   SURGEON:  Surgeon(s) and Role:    Erline Levine, MD - Primary    * Dawley, Theodoro Doing, DO - Assisting  PHYSICIAN ASSISTANT:   ASSISTANTS: Poteat, RN   ANESTHESIA:   general  EBL:  20 mL   BLOOD ADMINISTERED:none  DRAINS: none   LOCAL MEDICATIONS USED:  MARCAINE    and LIDOCAINE   SPECIMEN:  No Specimen  DISPOSITION OF SPECIMEN:  N/A  COUNTS:  YES  TOURNIQUET:  * No tourniquets in log *  DICTATION: Patient is a 60 year old with severe spondylosis stenosis and scoliosis of the lumbar spine. It was elected to take her to surgery for anterolateral decompression and posterior percutaneous pedicle screw fixation.    Procedure: Patient was brought to the operating room and placed in a lateral decubitus position on the operative table and using orthogonally projected C-arm fluoroscopy the patient was placed so that the L3-4 and L 4 5 levels were visualized in AP and lateral plane using Pulse. The patient was then taped into position. Skin was marked along with a posterior finger dissection incision. Her right flank was then prepped and draped in usual sterile fashion and incisions were made sequentially at L 45 and  L3-4 levels. Posterior finger dissection was made to enter the retroperitoneal space and then subsequently the probe was inserted into the psoas muscle from the right side initially at the L 45 level. After  mapping the neural elements were able to dock the probe per the midpoint of this vertebral level and without indications electrically of too close proximity to the neural tissues. Subsequently the self-retaining tractor was.after sequential dilators were utilized the shim was employed and the interspace was cleared of psoas muscle and then incised. A thorough discectomy was performed. Angled instruments were used to clear the interspace of disc material.An anterior entry with posterior trajectory was performed to avoid neural elements.   After thorough discectomy was performed and this was performed using AP and lateral fluoroscopy an 8 lordotic by 50 x 18 mm titanium implant was packed with extra small BMP and Attrax. This was tamped into position and its position was confirmed on AP and lateral fluoroscopy. Subsequently exposure was performed at the L3-4 level and similar dissection was performed with locking of the self-retaining retractor. At this level were able to place an 8 lordotic  by 18 x 45 mm titanium implant packed in a similar fashion.  Hemostasis was assured the wounds were irrigated and closed with interrupted Vicryl sutures.  Sterile occlusive dressings were placed. Retractor times were:  L 45: 13 minutes;  L 34: 16 minutes.   Patient was then turned into a prone position on the [place] operating table and using AP and lateral fluoroscopy throughout this portion of the procedure, pedicle screws were placed using Reline Nuvasive cannulated percutaneous screws. 2 screws were placed at L 3 and (5.5 x 4 mm left and 5.5 x 35 mm right) and 2 at L 4 (5.5 x 40), and  2 at L5  (6.5  x 40). 75 mm rod was then affixed to the screw heads do a separate stab incision and locked down on the screws on the left and 65 mm rod on the right. All connections were then torqued and the Towers were disassembled. The wounds were irrigated and then closed with 1, 2-0 and 3-0 Vicryl stitches. Sterile occlusive dressing was  placed with Dermabond and occlusive dressings.  Long-acting Marcaine was injected. The patient was then extubated in the operating room and taken to recovery in stable and satisfactory condition having tolerated her operation well. Counts were correct at the end of the case.  Pelvic Parameters:  Preop: PT 12; PI 43; LL39; PI-LL 4 SVA 32 mm  PLAN OF CARE: Admit to inpatient   PATIENT DISPOSITION:  PACU - hemodynamically stable.   Delay start of Pharmacological VTE agent (>24hrs) due to surgical blood loss or risk of bleeding: yes

## 2021-05-15 NOTE — Anesthesia Procedure Notes (Signed)
Procedure Name: Intubation Date/Time: 05/15/2021 9:50 AM Performed by: Claris Che, CRNA Pre-anesthesia Checklist: Patient identified, Emergency Drugs available, Suction available, Patient being monitored and Timeout performed Patient Re-evaluated:Patient Re-evaluated prior to induction Oxygen Delivery Method: Circle system utilized Preoxygenation: Pre-oxygenation with 100% oxygen Induction Type: IV induction and Cricoid Pressure applied Ventilation: Mask ventilation without difficulty Laryngoscope Size: Mac and 4 Grade View: Grade II Tube type: Oral Tube size: 7.5 mm Number of attempts: 1 Airway Equipment and Method: Stylet Placement Confirmation: ETT inserted through vocal cords under direct vision, positive ETCO2 and breath sounds checked- equal and bilateral Secured at: 23 cm Tube secured with: Tape Dental Injury: Teeth and Oropharynx as per pre-operative assessment

## 2021-05-15 NOTE — Progress Notes (Signed)
PHARMACIST - PHYSICIAN COMMUNICATION  CONCERNING: P&T Medication Policy Regarding Oral Bisphosphonates  RECOMMENDATION: Your order for alendronate (Fosamax), ibandronate (Boniva), or risedronate (Actonel) has been discontinued at this time.  If the patient's post-hospital medical condition warrants safe use of this class of drugs, please resume the pre-hospital regimen upon discharge.  DESCRIPTION:  Alendronate (Fosamax), ibandronate (Boniva), and risedronate (Actonel) can cause severe esophageal erosions in patients who are unable to remain upright at least 30 minutes after taking this medication.   Since brief interruptions in therapy are thought to have minimal impact on bone mineral density, the [facility] has established that bisphosphonate orders should be routinely discontinued during hospitalization.   To override this safety policy and permit administration of Boniva, Fosamax, or Actonel in the hospital, prescribers must write "DO NOT HOLD" in the comments section when placing the order for this class of medications.  Tina Olsen A. Levada Dy, PharmD, BCPS, FNKF Clinical Pharmacist [place] Please utilize Amion for appropriate phone number to reach the unit pharmacist ([place])

## 2021-05-15 NOTE — Anesthesia Preprocedure Evaluation (Signed)
Anesthesia Evaluation  Patient identified by MRN, date of birth, ID band Patient awake    Reviewed: Allergy & Precautions, NPO status , Patient's Chart, lab work & pertinent test results  History of Anesthesia Complications (+) PONV  Airway Mallampati: II  TM Distance: >3 FB Neck ROM: Full    Dental no notable dental hx.    Pulmonary neg pulmonary ROS, former smoker,    Pulmonary exam normal breath sounds clear to auscultation       Cardiovascular negative cardio ROS Normal cardiovascular exam Rhythm:Regular Rate:Normal     Neuro/Psych negative neurological ROS  negative psych ROS   GI/Hepatic negative GI ROS, Neg liver ROS,   Endo/Other  Hypothyroidism   Renal/GU negative Renal ROS  negative genitourinary   Musculoskeletal  (+) Arthritis , Rheumatoid disorders,    Abdominal   Peds negative pediatric ROS (+)  Hematology negative hematology ROS (+)   Anesthesia Other Findings   Reproductive/Obstetrics negative OB ROS                             Anesthesia Physical Anesthesia Plan  ASA: 2  Anesthesia Plan: General   Post-op Pain Management:    Induction: Intravenous  PONV Risk Score and Plan: 4 or greater and Ondansetron, Dexamethasone, Midazolam, Scopolamine patch - Pre-op and Treatment may vary due to age or medical condition  Airway Management Planned: Oral ETT  Additional Equipment:   Intra-op Plan:   Post-operative Plan: Extubation in OR  Informed Consent: I have reviewed the patients History and Physical, chart, labs and discussed the procedure including the risks, benefits and alternatives for the proposed anesthesia with the patient or authorized representative who has indicated his/her understanding and acceptance.     Dental advisory given  Plan Discussed with: CRNA and Surgeon  Anesthesia Plan Comments:         Anesthesia Quick Evaluation

## 2021-05-16 ENCOUNTER — Encounter (HOSPITAL_COMMUNITY): Payer: Self-pay | Admitting: Neurosurgery

## 2021-05-16 MED ORDER — METHOCARBAMOL 500 MG PO TABS: 500.0000 mg | ORAL_TABLET | Freq: Four times a day (QID) | ORAL | 0 refills | Status: DC | PRN

## 2021-05-16 MED ORDER — ONDANSETRON HCL 4 MG PO TABS: 4.0000 mg | ORAL_TABLET | Freq: Every day | ORAL | 1 refills | Status: AC | PRN

## 2021-05-16 MED ORDER — OXYCODONE-ACETAMINOPHEN 5-325 MG PO TABS: 1.0000 | ORAL_TABLET | Freq: Four times a day (QID) | ORAL | 0 refills | Status: DC | PRN

## 2021-05-16 NOTE — Discharge Instructions (Signed)

## 2021-05-16 NOTE — Progress Notes (Signed)
Subjective: Patient reports that she is doing well overall. Had one episode of emesis overnight that she relates to anesthesia and pain medication. She does have some left side and hip pain that is well controlled with PO analgesics.   Objective: Vital signs in last 24 hours: Temp:  [97.2 F (36.2 C)-98.5 F (36.9 C)] 98.5 F (36.9 C) (07/08 0716) Pulse Rate:  [41-85] 41 (07/08 0716) Resp:  [8-19] 18 (07/08 0716) BP: (88-111)/(51-67) 94/52 (07/08 0716) SpO2:  [99 %-100 %] 100 % (07/08 0716)  Intake/Output from previous day: 07/07 0701 - 07/08 0700 In: 1950 [I.V.:1700; IV Piggyback:250] Out: 270 [Urine:250; Blood:20] Intake/Output this shift: No intake/output data recorded.  Physical Exam: Patient is awake, A/O X 4, conversant, and in good spirits. Speech is fluent and appropriate. Doing well. MAEW. Left EHL 5/5, Right EHL 4-/5 Dressings are clean dry intact. Incisions are well approximated with no drainage, erythema, or edema.  LSO brace in place   Lab Results: No results for input(s): WBC, HGB, HCT, PLT in the last 72 hours. BMET No results for input(s): NA, K, CL, CO2, GLUCOSE, BUN, CREATININE, CALCIUM in the last 72 hours.  Studies/Results: DG Lumbar Spine 2-3 Views  Result Date: 05/15/2021 CLINICAL DATA:  Surgical posterior fusion of L3-4 and L4-5. EXAM: LUMBAR SPINE - 2-3 VIEW Radiation exposure index: 16.32 mGy. COMPARISON:  None. FINDINGS: Two intraoperative fluoroscopic images were obtained of the lower lumbar spine. These demonstrate the patient be status post surgical posterior fusion of L3-4 and L4-5 with bilateral intrapedicular screw placement and interbody fusion. IMPRESSION: Fluoroscopic guidance provided during lower lumbar surgery. Electronically Signed   By: Marijo Conception M.D.   On: 05/15/2021 15:52   DG Lumbar Spine 2-3 Views  Result Date: 05/15/2021 CLINICAL DATA:  Intraoperative imaging for patient undergoing lumbar surgery. EXAM: LUMBAR SPINE - 2-3 VIEW;  DG C-ARM 1-60 MIN COMPARISON:  Plain films lumbar spine 12/30/scratch the scoliosis series 11/07/2020. FINDINGS: Intraoperative fluoroscopic spot views demonstrate interbody spacers in place at L3-4 and L4-5. No unexpected radiopaque foreign body. No acute abnormality is identified. IMPRESSION: Negative for retained surgical instrument. Intraoperative imaging for patient undergoing L3-4 at L4-5 fusion. Electronically Signed   By: Inge Rise M.D.   On: 05/15/2021 12:03   DG C-Arm 1-60 Min  Result Date: 05/15/2021 CLINICAL DATA:  Intraoperative imaging for patient undergoing lumbar surgery. EXAM: LUMBAR SPINE - 2-3 VIEW; DG C-ARM 1-60 MIN COMPARISON:  Plain films lumbar spine 12/30/scratch the scoliosis series 11/07/2020. FINDINGS: Intraoperative fluoroscopic spot views demonstrate interbody spacers in place at L3-4 and L4-5. No unexpected radiopaque foreign body. No acute abnormality is identified. IMPRESSION: Negative for retained surgical instrument. Intraoperative imaging for patient undergoing L3-4 at L4-5 fusion. Electronically Signed   By: Inge Rise M.D.   On: 05/15/2021 12:03    Assessment/Plan: Patient is post-op day 1 s/p right-sided XLIF at the L3- 4 and L4-5 levels with percutaneous pedicle screw fixation. She is recovering well and reports a significant reduction in her preoperative symptoms.  Her only complaint is mild incisional discomfort and right side/hip pain.  She has ambulated with PT who is recommending supervision with ambulation when out of bed and no PT follow-up needed.  She is awaiting OT evaluation.  Continue LSO brace when OOB. Continue working on pain control, mobility and ambulating patient. Will plan for discharge today.    LOS: 1 day     Marvis Moeller, DNP, NP-C 05/16/2021, 7:48 AM

## 2021-05-16 NOTE — Progress Notes (Signed)
Patient was transported via wheelchair by volunteer for discharge home; in no acute distress nor complaints of pain nor discomfort; all belongings checked and accounted for; discharge instructions given to patient and she verbalized understanding on the instructions given.

## 2021-05-16 NOTE — Discharge Summary (Signed)
Physician Discharge Summary  Patient ID: Tina Olsen MRN: [id number] DOB/AGE: Apr 28, 1961 60 y.o.  Admit date: 05/15/2021 Discharge date: 05/16/2021  Admission Diagnoses: Spondylolisthesis, Lumbar region, scoliosis, lumbar foraminal stenosis, lumbago, lumbar radiculopathy L 34 and L 45 levels  Discharge Diagnoses: Spondylolisthesis, Lumbar region, scoliosis, lumbar foraminal stenosis, lumbago, lumbar radiculopathy L 34 and L 45 levels Active Problems:   Scoliosis of lumbar spine, unspecified scoliosis type   Discharged Condition: good  Hospital Course: The patient was admitted on 05/16/2021 and taken to the operating room where the patient underwent right-sided XLIF at the L3-4 and L4-5 levels with percutaneous pedicle screw fixation. The patient tolerated the procedure well and was taken to the recovery room and then to the floor in stable condition. The hospital course was routine. There were no complications. The wound remained clean dry and intact. Pt had appropriate back and side soreness. No complaints of leg pain or new N/T/W. The patient remained afebrile with stable vital signs, and tolerated a regular diet. The patient continued to increase activities, and pain was well controlled with oral pain medications.    Consults: None  Significant Diagnostic Studies: radiology: X-Ray: intraoperative   Treatments: surgery: Right Lumbar three-four, Lumbar four-five Anterolateral lumbar interbody fusion (Right) Percutaneous pedicle screw fixation Lumbar three-five (N/A) APPLICATION OF PULSE  Discharge Exam: Blood pressure (!) 94/52, pulse (!) 41, temperature 98.5 F (36.9 C), temperature source Oral, resp. rate 18, height 5\' 2"  (1.575 m), weight 56.7 kg, SpO2 100 %.  Physical Exam: Patient is awake, A/O X 4, conversant, and in good spirits. Speech is fluent and appropriate. Doing well. MAEW. Left EHL 5/5, Right EHL 4-/5 Dressings are clean dry intact. Incisions are well approximated  with no drainage, erythema, or edema.     Disposition: Discharge disposition: 01-Home or Self Care        Allergies as of 05/16/2021       Reactions   Sulfa Antibiotics Rash        Medication List     STOP taking these medications    diclofenac 75 MG EC tablet Commonly known as: VOLTAREN       TAKE these medications    alendronate 70 MG tablet Commonly known as: FOSAMAX Take 70 mg by mouth every Monday. Take with a full glass of water on an empty stomach.   Biotin 5 MG Caps Take 5 mg by mouth daily.   cetirizine 10 MG tablet Commonly known as: ZYRTEC Take 10 mg by mouth 2 (two) times daily.   cyanocobalamin 1000 MCG/ML injection Commonly known as: (VITAMIN B-12) Inject 1,000 mcg into the muscle every 30 (thirty) days.   estradiol 1 MG tablet Commonly known as: ESTRACE Take 1 mg by mouth daily.   levothyroxine 75 MCG tablet Commonly known as: SYNTHROID Take 75 mcg by mouth daily before breakfast.   MAGNESIUM CITRATE PO Take 600 mg by mouth daily.   methocarbamol 500 MG tablet Commonly known as: ROBAXIN Take 1 tablet (500 mg total) by mouth every 6 (six) hours as needed for muscle spasms.   ondansetron 4 MG tablet Commonly known as: Zofran Take 1 tablet (4 mg total) by mouth daily as needed for nausea or vomiting.   oxyCODONE-acetaminophen 5-325 MG tablet Commonly known as: Percocet Take 1-2 tablets by mouth every 6 (six) hours as needed for severe pain.   Vitamin D 50 MCG (2000 UT) tablet Take 2,000 Units by mouth daily.       ASK your doctor about these  medications    amoxicillin 875 MG tablet Commonly known as: AMOXIL Take 1 tablet (875 mg total) by mouth 2 (two) times daily.         Signed: Marvis Moeller, DNP, NP-C 05/16/2021, 9:02 AM

## 2021-05-16 NOTE — Evaluation (Signed)
Occupational Therapy Evaluation Patient Details Name: Tina Olsen MRN: [id number] DOB: October 15, 1961 Today's Date: 05/16/2021    History of Present Illness 60 yo female presenting s/p L3-4, L4-5 ALIF on 7/7 due to spondylolisthesis. PMH includes: hypothyroidism.   Clinical Impression   PTA, pt was independent and lived alone. Currently, pt performing ADLs with mod I for increased time. Pt educated and demonstrating compensatory strategies for LB dressing, oral care, shower transfers, toileting, and brace application within precautions. Pt recalling 3/3 precautions. All questions answered. Recommend discharge home with no OT follow up. Re-consult if change in status. OT to sign off.     Follow Up Recommendations  No OT follow up    Equipment Recommendations  None recommended by OT    Recommendations for Other Services       Precautions / Restrictions Precautions Precautions: Back Precaution Booklet Issued: Yes (comment) Precaution Comments: All education reviewed and pt recalling precautions upon entry. Verbalizing understanding. Required Braces or Orthoses: Spinal Brace Spinal Brace: Lumbar corset Restrictions Weight Bearing Restrictions: No      Mobility Bed Mobility               General bed mobility comments: Pt standing in room upon arrival, and PT receiving pt from OT in hallway to practice stairs.    Transfers Overall transfer level: Modified independent Equipment used: None             General transfer comment: Mod I for increased time.    Balance Overall balance assessment: Modified Independent                                         ADL either performed or assessed with clinical judgement   ADL Overall ADL's : Modified independent                                       General ADL Comments: Mod I for increased time. Pt educated and demonstrating compensatory techniques for LB dressing, oral care, shower  transfer, toileting, and brace application within precautions.     Vision Baseline Vision/History: Wears glasses Wears Glasses:  (glasses present in room, however, pt not wearing during OT session.) Vision Assessment?: No apparent visual deficits     Perception Perception Perception Tested?: No Comments: no apparent difficulty with perception   Praxis Praxis Praxis tested?: Not tested Praxis-Other Comments: no apparent difficulty with motor planning.    Pertinent Vitals/Pain Pain Assessment: Faces Faces Pain Scale: Hurts little more Pain Location: back Pain Descriptors / Indicators: Operative site guarding Pain Intervention(s): Monitored during session     Hand Dominance Right   Extremity/Trunk Assessment Upper Extremity Assessment Upper Extremity Assessment: Generalized weakness   Lower Extremity Assessment Lower Extremity Assessment: Defer to PT evaluation   Cervical / Trunk Assessment Cervical / Trunk Assessment: Other exceptions Cervical / Trunk Exceptions: s/p lumbar spinal surgery.   Communication Communication Communication: No difficulties   Cognition Arousal/Alertness: Awake/alert Behavior During Therapy: WFL for tasks assessed/performed Overall Cognitive Status: Within Functional Limits for tasks assessed                                 General Comments: Pt pleasant and conversational throughout session. Recalling 3/3 back precautions upon therapist entry.  General Comments  Pt reporting her best friends will be staying with her for two nights after discharge. Pt with mod I for increased time with all ADLs, and pt with cautious movement during session.    Exercises     Shoulder Instructions      Home Living Family/patient expects to be discharged to:: Private residence Living Arrangements: Alone Available Help at Discharge: Friend(s) Type of Home: Apartment Home Access: Stairs to enter [employer] of Steps: one flight,  lives on second story Entrance Stairs-Rails: Right;Left Home Layout: One level     Bathroom Shower/Tub: Tub/shower unit;Walk-in Psychologist, prison and probation services: Standard     Home Equipment: Shower seat - built in          Prior Functioning/Environment Level of Independence: Independent                 OT Problem List: Decreased strength;Decreased activity tolerance;Pain;Decreased knowledge of precautions      OT Treatment/Interventions:      OT Goals(Current goals can be found in the care plan section) Acute Rehab OT Goals Patient Stated Goal: go home OT Goal Formulation: With patient  OT Frequency:     Barriers to D/C:            Co-evaluation              AM-PAC OT "6 Clicks" Daily Activity     Outcome Measure Help from another person eating meals?: None Help from another person taking care of personal grooming?: None Help from another person toileting, which includes using toliet, bedpan, or urinal?: None Help from another person bathing (including washing, rinsing, drying)?: None Help from another person to put on and taking off regular upper body clothing?: None Help from another person to put on and taking off regular lower body clothing?: None 6 Click Score: 24   End of Session Equipment Utilized During Treatment: Gait belt;Back brace Nurse Communication: Mobility status  Activity Tolerance: Patient tolerated treatment well Patient left: Other (comment) (in hallway with PT)  OT Visit Diagnosis: Unsteadiness on feet (R26.81);Muscle weakness (generalized) (M62.81)                Time: 4709-6283 OT Time Calculation (min): 17 min Charges:  OT General Charges $OT Visit: 1 Visit OT Evaluation $OT Eval Low Complexity: 1 Low  Shanda Howells, OTDS   Shanda Howells 05/16/2021, 8:10 AM

## 2021-05-16 NOTE — Evaluation (Signed)
Physical Therapy Evaluation Patient Details Name: Tina Olsen MRN: [id number] DOB: 19-Jun-1961 Today's Date: 05/16/2021   History of Present Illness  60 yo female presenting s/p L3-4, L4-5 ALIF on 7/7 due to spondylolisthesis. PMH includes: hypothyroidism.   Clinical Impression  Pt ambulating in hallway upon arrival of PT, agreeable to evaluation at this time. Prior to admission the pt was completely independent with all mobility, avid cyclist, who lives alone. The pt now presents with minor limitations in functional mobility, RLE strength, and dynamic stability due to above dx, but is safe to return home with pre-arranged assist from friends when medically ready. The pt was able to complete 350 ft hallway ambulation without need for UE support or LOB, able to demo 12 steps without LOB or need for assistance and reports no change in pain. The pt was educated in spinal precautions, application of spinal precautions to mobility, and recommendations for progressive return to mobility. No further acute PT needs, thank you for the consult.       Follow Up Recommendations No PT follow up;Supervision - Intermittent    Equipment Recommendations  None recommended by PT    Recommendations for Other Services       Precautions / Restrictions Precautions Precautions: Back Precaution Booklet Issued: Yes (comment) Precaution Comments: All education reviewed and pt recalling precautions upon entry. Verbalizing understanding. Required Braces or Orthoses: Spinal Brace Spinal Brace: Lumbar corset Restrictions Weight Bearing Restrictions: No      Mobility  Bed Mobility Overal bed mobility: Independent             General bed mobility comments: pt able to complete with log roll without cues    Transfers Overall transfer level: Modified independent Equipment used: None             General transfer comment: supervision during session for safey, no evidence of  instability  Ambulation/Gait Ambulation/Gait assistance: Supervision Gait Distance (Feet): 350 Feet Assistive device: None Gait Pattern/deviations: Step-through pattern;Decreased stride length Gait velocity: 0.5 m/s Gait velocity interpretation: 1.31 - 2.62 ft/sec, indicative of limited community ambulator General Gait Details: pt with slightly decreased speed, cautious. no overt LOB, intermittently reaching for light UE support  Stairs Stairs: Yes Stairs assistance: Supervision Stair Management: One rail Left;Alternating pattern;Forwards Number of Stairs: 12 General stair comments: no LOB, discussed alternating and step to pattern, pt reports no change in pain with activity  Wheelchair Mobility    Modified Rankin (Stroke Patients Only)       Balance Overall balance assessment: Modified Independent                                           Pertinent Vitals/Pain Pain Assessment: Faces Faces Pain Scale: Hurts a little bit Pain Location: back Pain Descriptors / Indicators: Operative site guarding Pain Intervention(s): Limited activity within patient's tolerance;Monitored during session;Repositioned    Home Living Family/patient expects to be discharged to:: Private residence Living Arrangements: Alone Available Help at Discharge: Friend(s);Available PRN/intermittently Type of Home: Apartment Home Access: Stairs to enter Entrance Stairs-Rails: Psychiatric nurse of Steps: one flight, lives on second story Home Layout: One level Home Equipment: Shower seat - built in      Prior Function Level of Independence: Independent         Comments: pt avid cyclist     Hand Dominance   Dominant Hand: Right  Extremity/Trunk Assessment   Upper Extremity Assessment Upper Extremity Assessment: Overall WFL for tasks assessed    Lower Extremity Assessment Lower Extremity Assessment: RLE deficits/detail RLE Deficits / Details: grossly  4+/5, pt reporting slight limitation due to pain, but able to maintain against mod resistance. reports slight pain limitation RLE Sensation: decreased light touch (hip to upper thigh only) RLE Coordination: WNL    Cervical / Trunk Assessment Cervical / Trunk Assessment: Other exceptions Cervical / Trunk Exceptions: s/p lumbar spinal surgery.  Communication   Communication: No difficulties  Cognition Arousal/Alertness: Awake/alert Behavior During Therapy: WFL for tasks assessed/performed Overall Cognitive Status: Within Functional Limits for tasks assessed                                 General Comments: Pt pleasant and conversational throughout session. Recalling 3/3 back precautions upon therapist entry.      General Comments General comments (skin integrity, edema, etc.): pt reports good planning for food and assist after d/c. educated on progressive return to activity    Exercises     Assessment/Plan    PT Assessment Patent does not need any further PT services  PT Problem List         PT Treatment Interventions      PT Goals (Current goals can be found in the Care Plan section)  Acute Rehab PT Goals Patient Stated Goal: go home PT Goal Formulation: With patient Time For Goal Achievement: 05/30/21 Potential to Achieve Goals: Good    Frequency     Barriers to discharge        Co-evaluation               AM-PAC PT "6 Clicks" Mobility  Outcome Measure Help needed turning from your back to your side while in a flat bed without using bedrails?: None Help needed moving from lying on your back to sitting on the side of a flat bed without using bedrails?: None Help needed moving to and from a bed to a chair (including a wheelchair)?: None Help needed standing up from a chair using your arms (e.g., wheelchair or bedside chair)?: None Help needed to walk in hospital room?: A Little Help needed climbing 3-5 steps with a railing? : A Little 6 Click  Score: 22    End of Session Equipment Utilized During Treatment: Back brace Activity Tolerance: Patient tolerated treatment well;No increased pain Patient left: in bed;with call bell/phone within reach (with MD) Nurse Communication: Mobility status PT Visit Diagnosis: Other abnormalities of gait and mobility (R26.89);Pain Pain - Right/Left: Right    Time: 0752-0808 PT Time Calculation (min) (ACUTE ONLY): 16 min   Charges:   PT Evaluation $PT Eval Low Complexity: 1 Low          Karma Ganja, PT, DPT   Acute Rehabilitation Department Pager #: [phone]   Otho Bellows 05/16/2021, 8:20 AM

## 2021-05-22 MED FILL — Sodium Chloride IV Soln 0.9%: INTRAVENOUS | Qty: 1000 | Status: AC

## 2021-05-22 MED FILL — Heparin Sodium (Porcine) Inj 1000 Unit/ML: INTRAMUSCULAR | Qty: 10 | Status: AC

## 2021-05-25 ENCOUNTER — Emergency Department (HOSPITAL_COMMUNITY): Payer: BC Managed Care – PPO

## 2021-05-25 ENCOUNTER — Other Ambulatory Visit: Payer: Self-pay

## 2021-05-25 ENCOUNTER — Encounter (HOSPITAL_COMMUNITY): Payer: Self-pay | Admitting: Emergency Medicine

## 2021-05-25 ENCOUNTER — Emergency Department (HOSPITAL_COMMUNITY)
Admission: EM | Admit: 2021-05-25 | Discharge: 2021-05-25 | Disposition: A | Discharge status: Home / Self Care | Payer: BC Managed Care – PPO | Attending: Emergency Medicine | Admitting: Emergency Medicine

## 2021-05-25 DIAGNOSIS — Z79899 Other long term (current) drug therapy: Secondary | ICD-10-CM | POA: Diagnosis not present

## 2021-05-25 DIAGNOSIS — Z85828 Personal history of other malignant neoplasm of skin: Secondary | ICD-10-CM | POA: Diagnosis not present

## 2021-05-25 DIAGNOSIS — R1084 Generalized abdominal pain: Secondary | ICD-10-CM | POA: Diagnosis not present

## 2021-05-25 DIAGNOSIS — Z87891 Personal history of nicotine dependence: Secondary | ICD-10-CM | POA: Insufficient documentation

## 2021-05-25 DIAGNOSIS — E039 Hypothyroidism, unspecified: Secondary | ICD-10-CM | POA: Diagnosis not present

## 2021-05-25 DIAGNOSIS — R109 Unspecified abdominal pain: Secondary | ICD-10-CM | POA: Diagnosis not present

## 2021-05-25 DIAGNOSIS — K529 Noninfective gastroenteritis and colitis, unspecified: Secondary | ICD-10-CM | POA: Diagnosis not present

## 2021-05-25 DIAGNOSIS — K5900 Constipation, unspecified: Secondary | ICD-10-CM | POA: Diagnosis not present

## 2021-05-25 DIAGNOSIS — R1031 Right lower quadrant pain: Secondary | ICD-10-CM | POA: Diagnosis not present

## 2021-05-25 LAB — CBC WITH DIFFERENTIAL/PLATELET
Abs Immature Granulocytes: 0.05 10*3/uL (ref 0.00–0.07)
Basophils Absolute: 0 10*3/uL (ref 0.0–0.1)
Basophils Relative: 0 %
Eosinophils Absolute: 0.1 10*3/uL (ref 0.0–0.5)
Eosinophils Relative: 1 %
HCT: 39.1 % (ref 36.0–46.0)
Hemoglobin: 13.5 g/dL (ref 12.0–15.0)
Immature Granulocytes: 0 %
Lymphocytes Relative: 14 %
Lymphs Abs: 1.6 10*3/uL (ref 0.7–4.0)
MCH: 32.8 pg (ref 26.0–34.0)
MCHC: 34.5 g/dL (ref 30.0–36.0)
MCV: 94.9 fL (ref 80.0–100.0)
Monocytes Absolute: 1.3 10*3/uL — ABNORMAL HIGH (ref 0.1–1.0)
Monocytes Relative: 11 %
Neutro Abs: 8.3 10*3/uL — ABNORMAL HIGH (ref 1.7–7.7)
Neutrophils Relative %: 74 %
Platelets: 335 10*3/uL (ref 150–400)
RBC: 4.12 MIL/uL (ref 3.87–5.11)
RDW: 12.3 % (ref 11.5–15.5)
WBC: 11.4 10*3/uL — ABNORMAL HIGH (ref 4.0–10.5)
nRBC: 0 % (ref 0.0–0.2)

## 2021-05-25 LAB — COMPREHENSIVE METABOLIC PANEL
ALT: 12 U/L (ref 0–44)
AST: 13 U/L — ABNORMAL LOW (ref 15–41)
Albumin: 3.6 g/dL (ref 3.5–5.0)
Alkaline Phosphatase: 37 U/L — ABNORMAL LOW (ref 38–126)
Anion gap: 8 (ref 5–15)
BUN: 9 mg/dL (ref 6–20)
CO2: 24 mmol/L (ref 22–32)
Calcium: 9.3 mg/dL (ref 8.9–10.3)
Chloride: 103 mmol/L (ref 98–111)
Creatinine, Ser: 0.66 mg/dL (ref 0.44–1.00)
GFR, Estimated: >60 mL/min (ref >60)
Glucose, Bld: 107 mg/dL — ABNORMAL HIGH (ref 70–99)
Potassium: 3.8 mmol/L (ref 3.5–5.1)
Sodium: 135 mmol/L (ref 135–145)
Total Bilirubin: 1 mg/dL (ref 0.3–1.2)
Total Protein: 6.5 g/dL (ref 6.5–8.1)

## 2021-05-25 LAB — URINALYSIS, ROUTINE W REFLEX MICROSCOPIC
Bilirubin Urine: NEGATIVE
Glucose, UA: NEGATIVE mg/dL
Hgb urine dipstick: NEGATIVE
Ketones, ur: 5 mg/dL — AB
Leukocytes,Ua: NEGATIVE
Nitrite: NEGATIVE
Protein, ur: NEGATIVE mg/dL
Specific Gravity, Urine: 1.014 (ref 1.005–1.030)
pH: 8 (ref 5.0–8.0)

## 2021-05-25 LAB — LIPASE, BLOOD: Lipase: 26 U/L (ref 11–51)

## 2021-05-25 MED ORDER — ONDANSETRON HCL 4 MG/2ML IJ SOLN
4.0000 mg | Freq: Four times a day (QID) | INTRAMUSCULAR | Status: DC | PRN
  Administered 2021-05-25: 4 mg via INTRAVENOUS
  Filled 2021-05-25: qty 2

## 2021-05-25 MED ORDER — SODIUM CHLORIDE 0.9 % IV BOLUS
1000.0000 mL | Freq: Once | INTRAVENOUS | Status: AC
  Administered 2021-05-25: 1000 mL via INTRAVENOUS

## 2021-05-25 MED ORDER — MORPHINE SULFATE (PF) 4 MG/ML IV SOLN: 4.0000 mg | INTRAVENOUS | Status: DC | PRN

## 2021-05-25 MED ORDER — AMOXICILLIN-POT CLAVULANATE 875-125 MG PO TABS: 1.0000 | ORAL_TABLET | Freq: Two times a day (BID) | ORAL | 0 refills | Status: DC

## 2021-05-25 MED ORDER — IOHEXOL 300 MG/ML  SOLN
75.0000 mL | Freq: Once | INTRAMUSCULAR | Status: AC | PRN
  Administered 2021-05-25: 75 mL via INTRAVENOUS

## 2021-05-25 MED ORDER — ONDANSETRON HCL 4 MG/2ML IJ SOLN: 4.0000 mg | Freq: Four times a day (QID) | INTRAMUSCULAR | Status: DC

## 2021-05-25 MED ORDER — ONDANSETRON 4 MG PO TBDP: 4.0000 mg | ORAL_TABLET | Freq: Three times a day (TID) | ORAL | 0 refills | Status: DC | PRN

## 2021-05-25 NOTE — ED Triage Notes (Signed)
Patient coming from home, pt had surgery on 7/7. Had not had a bowel movement since Monday 7/11. Pt complaining of increasing abdominal pain and nausea. States she has tried stool softeners, laxatives and enemas per PCP. VSS.

## 2021-05-25 NOTE — ED Provider Notes (Signed)
[facility] EMERGENCY DEPARTMENT Provider Note   CSN: [id number] Arrival date & time: 05/25/21  0741     History Chief Complaint  Patient presents with   Abdominal Pain   Constipation    Tina Olsen is a 60 y.o. female.  60 year old female presents with complaint of lower abdominal pain with constipation. Patient had lumbar surgery on May 20, 2021, had passed gas and was able to dc home. Patient had 1 bowel movement 05/19/21 after several medications but has not had a bowel movement since that time despite taking Miralax, Colace, enemas and glycerine suppositories at home. Also nausea and vomiting for the past few days. Reports max temp of 99.8 at home. Not taking narcotic pain meds. No other complaints. No prior bowel obstruction, has had several prior abdominal/GYN surgeries.       Past Medical History:  Diagnosis Date   Allergy    Arthritis    Ostroarthritis and RA   Cancer ([place])    skin basal and squamous- upper back, left thigh   Family history of adverse reaction to anesthesia    post op nausea   Headache    Hypothyroidism    Pelvic inflammatory disease 1984   Pneumonia    age   PONV (postoperative nausea and vomiting)    Thyroid disease 2017    Patient Active Problem List   Diagnosis Date Noted   Scoliosis of lumbar spine, unspecified scoliosis type 20-May-2021   Carpal tunnel syndrome of right wrist 12/25/2020   Paresthesia 12/25/2020   Rheumatoid arthritis ([place]) 01/07/2018   Allergic rhinitis due to allergen 01/22/2017   Elevated TSH 02/13/2016    Past Surgical History:  Procedure Laterality Date   ABDOMINAL HYSTERECTOMY     ANTERIOR LAT LUMBAR FUSION Right 20-May-2021   Procedure: Right Lumbar three-four, Lumbar four-five Anterolateral lumbar interbody fusion;  Surgeon: Erline Levine, MD;  Location: [place];  Service: Neurosurgery;  Laterality: Right;   APPENDECTOMY     BREAST BIOPSY Left 2017   NEG- Fibroadenoma   DIAGNOSTIC  LAPAROSCOPY     LUMBAR PERCUTANEOUS PEDICLE SCREW 2 LEVEL N/A 2021/05/20   Procedure: Percutaneous pedicle screw fixation Lumbar three-five;  Surgeon: Erline Levine, MD;  Location: [place];  Service: Neurosurgery;  Laterality: N/A;   SINUS SURGERY WITH INSTATRAK  2003     OB History   No obstetric history on file.     Family History  Problem Relation Age of Onset   Colon cancer Paternal Grandfather    Breast cancer Neg Hx     Social History   Tobacco Use   Smoking status: Former    Types: Cigarettes    Quit date: 2003    Years since quitting: 19.5   Smokeless tobacco: Never  Vaping Use   Vaping Use: Never used  Substance Use Topics   Alcohol use: No   Drug use: No    Home Medications Prior to Admission medications   Medication Sig Start Date End Date Taking? Authorizing Provider  amoxicillin-clavulanate (AUGMENTIN) 875-125 MG tablet Take 1 tablet by mouth every 12 (twelve) hours. 05/25/21  Yes Tacy Learn, PA-C  ondansetron (ZOFRAN ODT) 4 MG disintegrating tablet Take 1 tablet (4 mg total) by mouth every 8 (eight) hours as needed for nausea or vomiting. 05/25/21  Yes Tacy Learn, PA-C  alendronate (FOSAMAX) 70 MG tablet Take 70 mg by mouth every Monday. Take with a full glass of water on an empty stomach.    [provider]  Biotin 5 MG CAPS Take 5 mg by mouth daily.    [provider]  cetirizine (ZYRTEC) 10 MG tablet Take 10 mg by mouth 2 (two) times daily.    [provider]  Cholecalciferol (VITAMIN D) 50 MCG (2000 UT) tablet Take 2,000 Units by mouth daily.    [provider]  cyanocobalamin (,VITAMIN B-12,) 1000 MCG/ML injection Inject 1,000 mcg into the muscle every 30 (thirty) days. 01/01/18   [provider]  estradiol (ESTRACE) 1 MG tablet Take 1 mg by mouth daily. 04/18/21   [provider]  levothyroxine (SYNTHROID, LEVOTHROID) 75 MCG tablet Take 75 mcg by mouth daily before breakfast.    [provider]  MAGNESIUM CITRATE PO Take 600 mg by mouth daily.    [provider]  methocarbamol (ROBAXIN) 500 MG tablet Take 1 tablet (500 mg total) by mouth every 6 (six) hours as needed for muscle spasms. 05/16/21   Marvis Moeller, NP  ondansetron (ZOFRAN) 4 MG tablet Take 1 tablet (4 mg total) by mouth daily as needed for nausea or vomiting. 05/16/21 05/16/22  Marvis Moeller, NP  oxyCODONE-acetaminophen (PERCOCET) 5-325 MG tablet Take 1-2 tablets by mouth every 6 (six) hours as needed for severe pain. 05/16/21 05/16/22  Marvis Moeller, NP    Allergies    Sulfa antibiotics  Review of Systems   Review of Systems  Constitutional:  Positive for fever.  Respiratory:  Negative for shortness of breath.   Cardiovascular:  Negative for chest pain.  Gastrointestinal:  Positive for abdominal pain, constipation, nausea and vomiting. Negative for blood in stool and diarrhea.  Genitourinary:  Negative for dysuria.  Musculoskeletal:  Negative for arthralgias and myalgias.  Skin:  Negative for rash and wound.  Allergic/Immunologic: Negative for immunocompromised state.  Neurological:  Negative for weakness and numbness.  Hematological:  Negative for adenopathy.  Psychiatric/Behavioral:  Negative for confusion.    Physical Exam Updated Vital Signs BP 103/67   Pulse 69   Temp 98.3 F (36.8 C) (Oral)   Resp 17   SpO2 100%   Physical Exam Vitals and nursing note reviewed.  Constitutional:      General: She is not in acute distress.    Appearance: She is well-developed. She is not diaphoretic.  HENT:     Head: Normocephalic and atraumatic.  Cardiovascular:     Rate and Rhythm: Normal rate and regular rhythm.     Heart sounds: Normal heart sounds.  Pulmonary:     Effort: Pulmonary effort is normal.     Breath sounds: Normal breath sounds.  Abdominal:     General: Bowel sounds are normal.     Tenderness: There is abdominal tenderness in the right lower quadrant and left  lower quadrant. There is no right CVA tenderness or left CVA tenderness.  Skin:    General: Skin is warm and dry.     Findings: No erythema or rash.     Comments: Surgical sites healing well  Neurological:     Mental Status: She is alert and oriented to person, place, and time.  Psychiatric:        Behavior: Behavior normal.    ED Results / Procedures / Treatments   Labs (all labs ordered are listed, but only abnormal results are displayed) Labs Reviewed  CBC WITH DIFFERENTIAL/PLATELET - Abnormal; Notable for the following components:      Result Value   WBC 11.4 (*)    Neutro Abs  8.3 (*)    Monocytes Absolute 1.3 (*)    All other components within normal limits  COMPREHENSIVE METABOLIC PANEL - Abnormal; Notable for the following components:   Glucose, Bld 107 (*)    AST 13 (*)    Alkaline Phosphatase 37 (*)    All other components within normal limits  URINALYSIS, ROUTINE W REFLEX MICROSCOPIC - Abnormal; Notable for the following components:   APPearance HAZY (*)    Ketones, ur 5 (*)    All other components within normal limits  LIPASE, BLOOD    EKG None  Radiology CT Abdomen Pelvis W Contrast  Result Date: 05/25/2021 CLINICAL DATA:  Abdominal pain post lumbar surgery EXAM: CT ABDOMEN AND PELVIS WITH CONTRAST TECHNIQUE: Multidetector CT imaging of the abdomen and pelvis was performed using the standard protocol following bolus administration of intravenous contrast. CONTRAST:  84mL OMNIPAQUE IOHEXOL 300 MG/ML  SOLN COMPARISON:  08/16/2009 FINDINGS: Lower chest: No pleural or pericardial effusion. Hepatobiliary: No focal liver abnormality is seen. No gallstones, gallbladder wall thickening, or biliary dilatation. Pancreas: Unremarkable. No pancreatic ductal dilatation or surrounding inflammatory changes. Spleen: Normal in size without focal abnormality. Adrenals/Urinary Tract: Adrenal glands are unremarkable. Kidneys are normal, without renal calculi, focal lesion, or  hydronephrosis. Bladder is unremarkable. Stomach/Bowel: The stomach is nondistended. Small bowel decompressed. Appendix surgically absent. There is wall thickening in the proximal sigmoid colon with scattered small diverticula. There moderate regional inflammatory/edematous changes and a small amount of free fluid without evidence of loculation or peripheral enhancement. There is moderate fecal distention of the rectum. Vascular/Lymphatic: No significant vascular findings are present. No enlarged abdominal or pelvic lymph nodes. Reproductive: Status post hysterectomy. No adnexal masses. Other: Small volume pelvic ascites.  No free air. Musculoskeletal: Small gas bubble and linear changes in the right retroperitoneum just above the iliac bone attributed to recent instrumented PLIF L3-L5. The hardware appears well positioned and intact. No fracture or worrisome bone lesion. IMPRESSION: 1. Wall thickening in the mid sigmoid colon with extensive regional inflammatory/edematous changes and a small amount of free fluid in the regional mesentery and pelvis. Consider colitis or diverticulitis. No discrete abscess or free air. Electronically Signed   By: Lucrezia Europe M.D.   On: 05/25/2021 10:47    Procedures Procedures   Medications Ordered in ED Medications  morphine 4 MG/ML injection 4 mg (has no administration in time range)  ondansetron (ZOFRAN) injection 4 mg (4 mg Intravenous Given 05/25/21 0837)  sodium chloride 0.9 % bolus 1,000 mL (0 mLs Intravenous Stopped 05/25/21 0920)  iohexol (OMNIPAQUE) 300 MG/ML solution 75 mL (75 mLs Intravenous Contrast Given 05/25/21 1018)    ED Course  I have reviewed the triage vital signs and the nursing notes.  Pertinent labs & imaging results that were available during my care of the patient were reviewed by me and considered in my medical decision making (see chart for details).  Clinical Course as of 05/25/21 1125  Sun May 25, 6734  [id number] 60 year old female with recent  lumbar surgery with constipation with abdominal pain, n/v, fever (higher than normal temp but below 100.4).  On exam generalized tenderness, worse across lower abdomen. Surgical sites healing well, bowel sounds normal. Labs with mild leukocytosis, ketones in urine, CMP unremarkable, lipase normal.   [LM]  1123 Vitals reassuring including room air O2 sat of 100%. Did not require IV pain medications, was given 1 dose of Zofran. CT with colitis versus diverticulitis.  Patient has been ambulatory to the  bathroom and has had 2 loose bowel movements while in the ER. Plan is to cover with Augmentin, will also give Zofran ODT as she has tablets at home, recommend recheck with her PCP in 2 days, given return to ER precautions. [LM]    Clinical Course User Index [LM] Roque Lias   MDM Rules/Calculators/A&P                           Final Clinical Impression(s) / ED Diagnoses Final diagnoses:  Generalized abdominal pain  Constipation, unspecified constipation type  Colitis    Rx / DC Orders ED Discharge Orders          Ordered    ondansetron (ZOFRAN ODT) 4 MG disintegrating tablet  Every 8 hours PRN        05/25/21 1119    amoxicillin-clavulanate (AUGMENTIN) 875-125 MG tablet  Every 12 hours        05/25/21 1119             Roque Lias 05/25/21 1126    Carmin Muskrat, MD 05/27/21 1535

## 2021-05-25 NOTE — Discharge Instructions (Addendum)
Take Augmentin as prescribed for concern for infection in the colon. Recheck with your doctor in 2 days, return to the ER for worsening or concerning symptoms.  Take Zofran as needed as prescribed for nausea and vomiting. Zofran can cause constipation, consider this when balancing stool softeners and laxative.

## 2021-06-05 ENCOUNTER — Encounter: Payer: Self-pay | Admitting: Gastroenterology

## 2021-06-05 ENCOUNTER — Ambulatory Visit: Payer: BC Managed Care – PPO | Admitting: Gastroenterology

## 2021-06-05 VITALS — BP 100/74 | HR 84 | Ht 62.25 in | Wt 124.2 lb

## 2021-06-05 DIAGNOSIS — R1084 Generalized abdominal pain: Secondary | ICD-10-CM

## 2021-06-05 MED ORDER — POLYETHYLENE GLYCOL 3350 17 GM/SCOOP PO POWD: ORAL | 3 refills | Status: DC

## 2021-06-05 NOTE — Patient Instructions (Addendum)
It was my pleasure to provide care to you today. Based on our discussion, I am providing you with my recommendations below:  RECOMMENDATION(S):   Continue Miralax daily to 2 times daily  RECORDS:  We have asked you to sign a Release of Information today to obtain records from Dr. Candace Cruise with [facility].   COLONOSCOPY:   You have been scheduled for a colonoscopy. Please follow written instructions given to you at your visit today.   PREP:   Please pick up your prep supplies at the pharmacy within the next 1-3 days.  INHALERS:   If you use inhalers (even only as needed), please bring them with you on the day of your procedure.  COLONOSCOPY TIPS:  To reduce nausea and dehydration, stay well hydrated for 3-4 days prior to the exam.  To prevent skin/hemorrhoid irritation - prior to wiping, put A&Dointment or vaseline on the toilet paper. Keep a towel or pad on the bed.  BEFORE STARTING YOUR PREP, drink  64oz of clear liquids in the morning. This will help to flush the colon and will ensure you are well hydrated!!!!  NOTE - This is in addition to the fluids required for to complete your prep. Use of a flavored hard candy, such as grape Anise Salvo, can counteract some of the flavor of the prep and may prevent some nausea.   FOLLOW UP:  After your procedure, you will receive a call from my office staff regarding my recommendation for follow up.  BMI:  If you are age 89 or younger, your body mass index should be between 19-25. Your There is no height or weight on file to calculate BMI. If this is out of the aformentioned range listed, please consider follow up with your Primary Care Provider.   MY CHART:  The [facility] providers would like to encourage you to use [facility] to communicate with providers for non-urgent requests or questions.  Due to long hold times on the telephone, sending your provider a message by [facility] may be a faster and more efficient way to get a response.   Please allow 48 business hours for a response.  Please remember that this is for non-urgent requests.   Thank you for trusting me with your gastrointestinal care!    Thornton Park, MD, MPH

## 2021-06-05 NOTE — Progress Notes (Signed)
Referring Provider: Schermerhorn, Gwen Her,* Primary Care Physician:  Schermerhorn, Gwen Her, MD  Reason for Consultation:  Diverticulitis   IMPRESSION:  Recent acute diverticulitis versus colitis by CT    - completed 7 days of Augmentin Sigmoid diverticulosis by CT Chronic NSAID use for RA Constipation Family history of colon cancer (paternal grandfather at age 58) IBS x 30+ years  Colitis versus diverticulitis on CT. Must consider IBD and SCAD in the differential given her history of RA. No ongoing symptoms related to diverticulitis. Reviewed recommendations to follow a high fiber diet or to use fiber supplements on a regular basis.  However, there is no need to avoid seeds, corn, berries, and nuts. Using NSAIDs may be associated with a moderately increased risk of occurrence of any episode of diverticulitis and complicated diverticulitis. Could consider surgical consultation for possible resection with recurrent symptoms.   Discussed strategies for minimizing constipation.    PLAN: Continue Miralax 17 g QD to BID Colonoscopy 07/10/21 Obtain prior records from Dr. Candace Cruise at [facility] for colonoscopy 2010   Please see the "Patient Instructions" section for addition details about the plan.  HPI: Tina Olsen is a 60 y.o. female self-referred for colitis versus diverticulitis on recent [place]. She has a history of rheumatoid arthritis managed with NSAIDs, skin cancer, thyroid dysfunction, and IBS. She has had an appendectomy and hysterectomy. 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  Longstanding history of abdominal problems and endometriosis. Experienced a mild increase in abdominal pain in February 2022 prompting her to schedule her colonoscopy this summer.  10 days after back surgery she developed constipation, abdominal pain and cramping, nausea and vomiing. Colace and Miralax were not providing relief. Required Glycerin suppositories and  enemas.    Evaluation in the ED at that time included CMP that was normal except for glucose of 107 and WBC 11.4. Liver enzymes and lipase were normal. CT abd/pelvis with contrast 05/25/21 showed wall thickening in the mid sigmoid colon with extensive regional ifnlammatory/edematous changes - likely colitis and diverticulitis. Completed 7 days of Augmentin. She has been feeling better since that time.  Continues on Colace, magnesium, and Miralax. She has stopped using opiates. Having two soft bowel movements daily on this regimen.   Colonoscopy in 2010 with Dr. Candace Cruise. She doesn't remember the specifics.   Paternal grandfather with colon cancer at age 47. Father without polyps. No other known family history of colon cancer or polyps. No family history of uterine/endometrial cancer, pancreatic cancer or gastric/stomach cancer.   Past Medical History:  Diagnosis Date   Allergy    Arthritis    Ostroarthritis and RA   Cancer ([place])    skin basal and squamous- upper back, left thigh   Family history of adverse reaction to anesthesia    post op nausea   Headache    Hypothyroidism    IBS (irritable bowel syndrome)    Pelvic inflammatory disease 1984   Pneumonia    age   PONV (postoperative nausea and vomiting)     Past Surgical History:  Procedure Laterality Date   ABDOMINAL HYSTERECTOMY     ANTERIOR LAT LUMBAR FUSION Right 05/15/2021   Procedure: Right Lumbar three-four, Lumbar four-five Anterolateral lumbar interbody fusion;  Surgeon: Erline Levine, MD;  Location: [place];  Service: Neurosurgery;  Laterality: Right;   APPENDECTOMY     BREAST BIOPSY Left 2017   NEG- Fibroadenoma   DIAGNOSTIC LAPAROSCOPY  2007   LAPAROSCOPIC ENDOMETRIOSIS FULGURATION  [place]   LUMBAR PERCUTANEOUS PEDICLE SCREW 2 LEVEL N/A 05/15/2021   Procedure: Percutaneous pedicle screw fixation Lumbar three-five;  Surgeon: Erline Levine, MD;  Location: [place];  Service: Neurosurgery;  Laterality: N/A;    SINUS SURGERY WITH INSTATRAK  2003    Current Outpatient Medications  Medication Sig Dispense Refill   Biotin 5 MG CAPS Take 5 mg by mouth daily.     cetirizine (ZYRTEC) 10 MG tablet Take 10 mg by mouth 2 (two) times daily.     Cholecalciferol (VITAMIN D) 50 MCG (2000 UT) tablet Take 2,000 Units by mouth daily.     cyanocobalamin (,VITAMIN B-12,) 1000 MCG/ML injection Inject 1,000 mcg into the muscle every 30 (thirty) days.  6   estradiol (ESTRACE) 1 MG tablet Take 1 mg by mouth daily.     levothyroxine (SYNTHROID, LEVOTHROID) 75 MCG tablet Take 75 mcg by mouth daily before breakfast.     MAGNESIUM CITRATE PO Take 600 mg by mouth daily.     methocarbamol (ROBAXIN) 500 MG tablet Take 1 tablet (500 mg total) by mouth every 6 (six) hours as needed for muscle spasms. 90 tablet 0   polyethylene glycol powder (GLYCOLAX/MIRALAX) 17 GM/SCOOP powder Dissolve 1 capful in 8 ounces of water or juice. Take daily to BID 255 g 3   alendronate (FOSAMAX) 70 MG tablet Take 70 mg by mouth every Monday. Take with a full glass of water on an empty stomach. (Patient not taking: Reported on 06/05/2021)     diclofenac (VOLTAREN) 75 MG EC tablet Take 1 tablet by mouth daily. (Patient not taking: Reported on 06/05/2021)     ondansetron (ZOFRAN ODT) 4 MG disintegrating tablet Take 1 tablet (4 mg total) by mouth every 8 (eight) hours as needed for nausea or vomiting. (Patient not taking: Reported on 06/05/2021) 12 tablet 0   ondansetron (ZOFRAN) 4 MG tablet Take 1 tablet (4 mg total) by mouth daily as needed for nausea or vomiting. (Patient not taking: Reported on 06/05/2021) 30 tablet 1   No current facility-administered medications for this visit.    Allergies as of 06/05/2021 - Review Complete 06/05/2021  Allergen Reaction Noted   Sulfa antibiotics Rash 07/26/2012    Family History  Problem Relation Age of Onset   Diabetes Mother    Atrial fibrillation Mother    Hypertension Father    Hypothyroidism Father     Other Father        DJD   Stroke Brother    Heart disease Maternal Grandmother    Diabetes Maternal Grandmother    Colon cancer Paternal Grandfather    Breast cancer Neg Hx     Social History   Socioeconomic History   Marital status: Single    Spouse name: Not on file   Number of children: 0   Years of education: Not on file   Highest education level: Not on file  Occupational History   Occupation: professor/archaeologist  Tobacco Use   Smoking status: Former    Types: Cigarettes    Quit date: 2003    Years since quitting: 19.5   Smokeless tobacco: Never  Vaping Use   Vaping Use: Never used  Substance and Sexual Activity   Alcohol use: No   Drug use: No   Sexual activity: Not Currently  Other Topics Concern   Not on file  Social History Narrative   Not on file   Social Determinants of Health   Financial Resource Strain: Not  on file  Food Insecurity: Not on file  Transportation Needs: Not on file  Physical Activity: Not on file  Stress: Not on file  Social Connections: Not on file  Intimate Partner Violence: Not on file    Review of Systems: 12 system ROS is negative except as noted above with the addition of allergies, sinus trouble, fatigue, muscle pains, and nosebleeds.   Physical Exam: General:   Alert,  well-nourished, pleasant and cooperative in NAD Head:  Normocephalic and atraumatic. Eyes:  Sclera clear, no icterus.   Conjunctiva pink. Abdomen:  Soft, nontender, nondistended, normal bowel sounds, no rebound or guarding. No hepatosplenomegaly.   Rectal:  Deferred  Msk:  Symmetrical. No boney deformities LAD: No inguinal or umbilical LAD Extremities:  No clubbing or edema. Neurologic:  Alert and  oriented x4;  grossly nonfocal Skin:  Intact without significant lesions or rashes. Psych:  Alert and cooperative. Normal mood and affect.     Marion Seese L. Tarri Glenn, MD, MPH 06/08/2021, 4:14 PM

## 2021-06-08 ENCOUNTER — Encounter: Payer: Self-pay | Admitting: Gastroenterology

## 2021-06-09 DIAGNOSIS — M5416 Radiculopathy, lumbar region: Secondary | ICD-10-CM | POA: Diagnosis not present

## 2021-06-11 DIAGNOSIS — F5222 Female sexual arousal disorder: Secondary | ICD-10-CM | POA: Diagnosis not present

## 2021-06-12 DIAGNOSIS — M5030 Other cervical disc degeneration, unspecified cervical region: Secondary | ICD-10-CM | POA: Diagnosis not present

## 2021-06-12 DIAGNOSIS — M0579 Rheumatoid arthritis with rheumatoid factor of multiple sites without organ or systems involvement: Secondary | ICD-10-CM | POA: Diagnosis not present

## 2021-06-12 DIAGNOSIS — M2550 Pain in unspecified joint: Secondary | ICD-10-CM | POA: Diagnosis not present

## 2021-06-12 DIAGNOSIS — M150 Primary generalized (osteo)arthritis: Secondary | ICD-10-CM | POA: Diagnosis not present

## 2021-06-16 ENCOUNTER — Other Ambulatory Visit: Payer: Self-pay | Admitting: Obstetrics and Gynecology

## 2021-06-16 DIAGNOSIS — Z01419 Encounter for gynecological examination (general) (routine) without abnormal findings: Secondary | ICD-10-CM | POA: Diagnosis not present

## 2021-06-16 DIAGNOSIS — Z1272 Encounter for screening for malignant neoplasm of vagina: Secondary | ICD-10-CM | POA: Diagnosis not present

## 2021-06-16 DIAGNOSIS — E538 Deficiency of other specified B group vitamins: Secondary | ICD-10-CM | POA: Diagnosis not present

## 2021-06-16 DIAGNOSIS — Z1231 Encounter for screening mammogram for malignant neoplasm of breast: Secondary | ICD-10-CM

## 2021-06-16 DIAGNOSIS — R978 Other abnormal tumor markers: Secondary | ICD-10-CM | POA: Diagnosis not present

## 2021-06-25 DIAGNOSIS — Z01419 Encounter for gynecological examination (general) (routine) without abnormal findings: Secondary | ICD-10-CM | POA: Diagnosis not present

## 2021-06-25 DIAGNOSIS — Z13220 Encounter for screening for lipoid disorders: Secondary | ICD-10-CM | POA: Diagnosis not present

## 2021-07-07 ENCOUNTER — Ambulatory Visit
Admission: RE | Admit: 2021-07-07 | Discharge: 2021-07-07 | Disposition: A | Discharge status: Home / Self Care | Payer: BC Managed Care – PPO | Source: Ambulatory Visit | Attending: Obstetrics and Gynecology | Admitting: Obstetrics and Gynecology

## 2021-07-07 ENCOUNTER — Other Ambulatory Visit: Payer: Self-pay

## 2021-07-07 DIAGNOSIS — Z1231 Encounter for screening mammogram for malignant neoplasm of breast: Secondary | ICD-10-CM

## 2021-07-08 DIAGNOSIS — F4310 Post-traumatic stress disorder, unspecified: Secondary | ICD-10-CM | POA: Diagnosis not present

## 2021-07-08 DIAGNOSIS — F4323 Adjustment disorder with mixed anxiety and depressed mood: Secondary | ICD-10-CM | POA: Diagnosis not present

## 2021-07-10 ENCOUNTER — Ambulatory Visit (AMBULATORY_SURGERY_CENTER): Payer: BC Managed Care – PPO | Admitting: Gastroenterology

## 2021-07-10 ENCOUNTER — Encounter: Payer: Self-pay | Admitting: Gastroenterology

## 2021-07-10 ENCOUNTER — Other Ambulatory Visit: Payer: Self-pay

## 2021-07-10 VITALS — BP 98/57 | HR 56 | Temp 97.8°F | Resp 11 | Ht 62.0 in | Wt 124.0 lb

## 2021-07-10 DIAGNOSIS — K5289 Other specified noninfective gastroenteritis and colitis: Secondary | ICD-10-CM

## 2021-07-10 DIAGNOSIS — R1084 Generalized abdominal pain: Secondary | ICD-10-CM | POA: Diagnosis not present

## 2021-07-10 DIAGNOSIS — K529 Noninfective gastroenteritis and colitis, unspecified: Secondary | ICD-10-CM | POA: Diagnosis not present

## 2021-07-10 DIAGNOSIS — K5730 Diverticulosis of large intestine without perforation or abscess without bleeding: Secondary | ICD-10-CM | POA: Diagnosis not present

## 2021-07-10 MED ORDER — SODIUM CHLORIDE 0.9 % IV SOLN: 500.0000 mL | Freq: Once | INTRAVENOUS | Status: DC

## 2021-07-10 NOTE — Progress Notes (Signed)
To pacu, VSS. Report to [email or web address] 

## 2021-07-10 NOTE — Op Note (Signed)
[name] Procedure Date: 07/10/2021 10:30 AM MRN: [id number] Endoscopist: Thornton Park MD, MD Age: 60 Referring MD:  Date of Birth: Feb 07, 1961 Gender: Female Account #: [id number] Procedure:                Colonoscopy Indications:              Recent acute diverticulitis versus colitis by CT                           - completed 7 days of Augmentin                           Sigmoid diverticulosis by CT                           Chronic NSAID use for RA                           Constipation                           Family history of colon cancer (paternal                            grandfather at age 74)                           IBS x 30+ years                           Colonoscopy with Dr. Oh 2010 Medicines:                Monitored Anesthesia Care Procedure:                Pre-Anesthesia Assessment:                           - Prior to the procedure, a History and Physical                            was performed, and patient medications and                            allergies were reviewed. The patient's tolerance of                            previous anesthesia was also reviewed. The risks                            and benefits of the procedure and the sedation                            options and risks were discussed with the patient.                            All questions were answered, and informed consent  was obtained. Prior Anticoagulants: The patient has                            taken no previous anticoagulant or antiplatelet                            agents. ASA Grade Assessment: II - A patient with                            mild systemic disease. After reviewing the risks                            and benefits, the patient was deemed in                            satisfactory condition to undergo the procedure.                           After obtaining informed consent, the  colonoscope                            was passed under direct vision. Throughout the                            procedure, the patient's blood pressure, pulse, and                            oxygen saturations were monitored continuously. The                            CF [id number] was introduced through the anus                            and advanced to the 3 cm into the ileum. The [id number]                            [id number] Slim SB Colonoscope was introduced                            through the and advanced to the. The colonoscopy                            was technically difficult and complex due to                            multiple diverticula in the colon. Successful                            completion of the procedure was aided by                            withdrawing the scope and replacing with the slim  colonoscope. A second forward view of the right                            colon was performed. The patient tolerated the                            procedure well. The quality of the bowel                            preparation was good. The terminal ileum, ileocecal                            valve, appendiceal orifice, and rectum were                            photographed. Scope In: 10:38:37 AM Scope Out: 10:59:45 AM Scope Withdrawal Time: 0 hours 9 minutes 58 seconds  Total Procedure Duration: 0 hours 21 minutes 8 seconds  Findings:                 The perianal and digital rectal examinations were                            normal.                           Multiple small and large-mouthed diverticula were                            found in the sigmoid colon and descending colon.                            Scattered diverticula were also seen in the right                            colon.                           A localized area of mildly congested and                            erythematous mucosa was found in the sigmoid colon                             in the area of diverticulosis. Biopsies were taken                            with a cold forceps for histology to evaluate for                            segmental colitis associated with diverticulosis.                            Estimated blood loss was minimal.  The exam was otherwise without abnormality on                            direct and retroflexion views. Complications:            No immediate complications. Estimated blood loss:                            Minimal. Estimated Blood Loss:     Estimated blood loss was minimal. Impression:               - Diverticulosis in the sigmoid colon and in the                            descending colon.                           - Congested and erythematous mucosa in the sigmoid                            colon. Biopsied.                           - The examination was otherwise normal on direct                            and retroflexion views. Recommendation:           - Patient has a contact number available for                            emergencies. The signs and symptoms of potential                            delayed complications were discussed with the                            patient. Return to normal activities tomorrow.                            Written discharge instructions were provided to the                            patient.                           - High fiber diet.                           - Continue present medications.                           - Use Miralax or Metamucil daily.                           - Drink at least 64 ounces of water daily.                           -  Await pathology results.                           - Repeat colonoscopy in 10 years for surveillance,                            earlier with new symptoms.                           - Emerging evidence supports eating a diet of                            fruits, vegetables, grains,  calcium, and yogurt                            while reducing red meat and alcohol may reduce the                            risk of colon cancer. Thornton Park MD, MD 07/10/2021 11:07:38 AM This report has been signed electronically.

## 2021-07-10 NOTE — Patient Instructions (Signed)
Diverticulosis. High fiber diet recommended with at least 64 oz water daily and Miralax or Metamucil daily. Resume previous medications.  Await pathology for final recommendations.  Handouts on findings given to patient.     YOU HAD AN ENDOSCOPIC PROCEDURE TODAY AT [facility]:   Refer to the procedure report that was given to you for any specific questions about what was found during the examination.  If the procedure report does not answer your questions, please call your gastroenterologist to clarify.  If you requested that your care partner not be given the details of your procedure findings, then the procedure report has been included in a sealed envelope for you to review at your convenience later.  YOU SHOULD EXPECT: Some feelings of bloating in the abdomen. Passage of more gas than usual.  Walking can help get rid of the air that was put into your GI tract during the procedure and reduce the bloating. If you had a lower endoscopy (such as a colonoscopy or flexible sigmoidoscopy) you may notice spotting of blood in your stool or on the toilet paper. If you underwent a bowel prep for your procedure, you may not have a normal bowel movement for a few days.  Please Note:  You might notice some irritation and congestion in your nose or some drainage.  This is from the oxygen used during your procedure.  There is no need for concern and it should clear up in a day or so.  SYMPTOMS TO REPORT IMMEDIATELY:  Following lower endoscopy (colonoscopy or flexible sigmoidoscopy):  Excessive amounts of blood in the stool  Significant tenderness or worsening of abdominal pains  Swelling of the abdomen that is new, acute  Fever of 100F or higher   For urgent or emergent issues, a gastroenterologist can be reached at any hour by calling [phone]. Do not use MyChart messaging for urgent concerns.    DIET:  We do recommend a small meal at first, but then you may proceed to your regular  diet.  Drink plenty of fluids but you should avoid alcoholic beverages for 24 hours.  ACTIVITY:  You should plan to take it easy for the rest of today and you should NOT DRIVE or use heavy machinery until tomorrow (because of the sedation medicines used during the test).    FOLLOW UP: Our staff will call the number listed on your records 48-72 hours following your procedure to check on you and address any questions or concerns that you may have regarding the information given to you following your procedure. If we do not reach you, we will leave a message.  We will attempt to reach you two times.  During this call, we will ask if you have developed any symptoms of COVID 19. If you develop any symptoms (ie: fever, flu-like symptoms, shortness of breath, cough etc.) before then, please call [phone].  If you test positive for Covid 19 in the 2 weeks post procedure, please call and report this information to [place].    If any biopsies were taken you will be contacted by phone or by letter within the next 1-3 weeks.  Please call us at [phone] if you have not heard about the biopsies in 3 weeks.    SIGNATURES/CONFIDENTIALITY: You and/or your care partner have signed paperwork which will be entered into your electronic medical record.  These signatures attest to the fact that that the information above on your After Visit Summary has been reviewed and  is understood.  Full responsibility of the confidentiality of this discharge information lies with you and/or your care-partner.

## 2021-07-10 NOTE — Progress Notes (Signed)
CW VS  Pt is 8 weeks s/p anterior lat lumbar fusion with lumbar percutaneous pedicle screw 2 level.  maw

## 2021-07-10 NOTE — Progress Notes (Signed)
Referring Provider: Schermerhorn, Gwen Her,* Primary Care Physician:  Schermerhorn, Gwen Her, MD  Reason for Procedure:  Recent diverticulitis   IMPRESSION:  Recent acute diverticulitis versus colitis by CT    - completed 7 days of Augmentin Sigmoid diverticulosis by CT Chronic NSAID use for RA Constipation Family history of colon cancer (paternal grandfather at age 58) IBS x 30+ years  PLAN: Colonoscopy in the [place] today   HPI: Tina Olsen is a 60 y.o. female presents for diagnostic colonoscopy.  Longstanding history of abdominal problems and endometriosis. Experienced a mild increase in abdominal pain in February 2022 prompting her to schedule her colonoscopy this summer.  10 days after back surgery she developed constipation, abdominal pain and cramping, nausea and vomiing. Colace and Miralax were not providing relief. Required Glycerin suppositories and enemas.     Evaluation in the ED at that time included CMP that was normal except for glucose of 107 and WBC 11.4. Liver enzymes and lipase were normal. CT abd/pelvis with contrast 05/25/21 showed wall thickening in the mid sigmoid colon with extensive regional ifnlammatory/edematous changes - likely colitis and diverticulitis. Completed 7 days of Augmentin. She has been feeling better since that time.   Continues on Colace, magnesium, and Miralax. She has stopped using opiates. Having two soft bowel movements daily on this regimen.    Colonoscopy in 2010 with Dr. Candace Cruise. She doesn't remember the specifics.    Paternal grandfather with colon cancer at age 34. Father without polyps. No other known family history of colon cancer or polyps. No family history of uterine/endometrial cancer, pancreatic cancer or gastric/stomach cancer.   Past Medical History:  Diagnosis Date   Allergy    Arthritis    Ostroarthritis and RA   Cancer ([place])    skin basal and squamous- upper back, left thigh   Family history of adverse reaction  to anesthesia    post op nausea   GERD (gastroesophageal reflux disease)    Headache    Hypothyroidism    IBS (irritable bowel syndrome)    Pelvic inflammatory disease 1984   Pneumonia    age   PONV (postoperative nausea and vomiting)     Past Surgical History:  Procedure Laterality Date   ABDOMINAL HYSTERECTOMY     ANTERIOR LAT LUMBAR FUSION Right 05/15/2021   Procedure: Right Lumbar three-four, Lumbar four-five Anterolateral lumbar interbody fusion;  Surgeon: Erline Levine, MD;  Location: [facility];  Service: Neurosurgery;  Laterality: Right;   APPENDECTOMY     BREAST BIOPSY Left 2017   NEG- Fibroadenoma   DIAGNOSTIC LAPAROSCOPY  2007   LAPAROSCOPIC ENDOMETRIOSIS FULGURATION  1989   LAPAROTOMY  1984   LUMBAR PERCUTANEOUS PEDICLE SCREW 2 LEVEL N/A 05/15/2021   Procedure: Percutaneous pedicle screw fixation Lumbar three-five;  Surgeon: Erline Levine, MD;  Location: [facility];  Service: Neurosurgery;  Laterality: N/A;   SINUS SURGERY WITH INSTATRAK  2003    Current Outpatient Medications  Medication Sig Dispense Refill   Biotin 5 MG CAPS Take 5 mg by mouth daily.     cetirizine (ZYRTEC) 10 MG tablet Take 10 mg by mouth 2 (two) times daily.     Cholecalciferol (VITAMIN D) 50 MCG (2000 UT) tablet Take 2,000 Units by mouth daily.     cyclobenzaprine (FLEXERIL) 5 MG tablet Take 5-10 mg by mouth 3 (three) times daily.     estradiol (ESTRACE) 1 MG tablet Take 1 mg by mouth daily.     levothyroxine (SYNTHROID, LEVOTHROID) 75 MCG  tablet Take 75 mcg by mouth daily before breakfast.     methocarbamol (ROBAXIN) 500 MG tablet Take 1 tablet (500 mg total) by mouth every 6 (six) hours as needed for muscle spasms. 90 tablet 0   alendronate (FOSAMAX) 70 MG tablet Take 70 mg by mouth every Monday. Take with a full glass of water on an empty stomach. (Patient not taking: No sig reported)     cyanocobalamin (,VITAMIN B-12,) 1000 MCG/ML injection Inject 1,000 mcg into the muscle every 30 (thirty) days.  6    diclofenac (VOLTAREN) 75 MG EC tablet Take 1 tablet by mouth daily.     MAGNESIUM CITRATE PO Take 600 mg by mouth daily.     ondansetron (ZOFRAN ODT) 4 MG disintegrating tablet Take 1 tablet (4 mg total) by mouth every 8 (eight) hours as needed for nausea or vomiting. (Patient not taking: No sig reported) 12 tablet 0   ondansetron (ZOFRAN) 4 MG tablet Take 1 tablet (4 mg total) by mouth daily as needed for nausea or vomiting. (Patient not taking: No sig reported) 30 tablet 1   polyethylene glycol powder (GLYCOLAX/MIRALAX) 17 GM/SCOOP powder Dissolve 1 capful in 8 ounces of water or juice. Take daily to BID 255 g 3   testosterone cypionate (DEPOTESTOSTERONE CYPIONATE) 200 MG/ML injection Inject into the muscle every 30 (thirty) days.     Current Facility-Administered Medications  Medication Dose Route Frequency Provider Last Rate Last Admin   0.9 %  sodium chloride infusion  500 mL Intravenous Once Thornton Park, MD        Allergies as of 07/10/2021 - Review Complete 07/10/2021  Allergen Reaction Noted   Sulfa antibiotics Rash 07/26/2012    Family History  Problem Relation Age of Onset   Diabetes Mother    Atrial fibrillation Mother    Hypertension Father    Hypothyroidism Father    Other Father        DJD   Stroke Brother    Heart disease Maternal Grandmother    Diabetes Maternal Grandmother    Colon cancer Paternal Grandfather    Breast cancer Neg Hx      Physical Exam: General:   Alert,  well-nourished, pleasant and cooperative in NAD Head:  Normocephalic and atraumatic. Eyes:  Sclera clear, no icterus.   Conjunctiva pink. Mouth:  No deformity or lesions.   Neck:  Supple; no masses or thyromegaly. Lungs:  Clear throughout to auscultation.   No wheezes. Heart:  Regular rate and rhythm; no murmurs. Abdomen:  Soft, non-tender, nondistended, normal bowel sounds, no rebound or guarding.  Msk:  Symmetrical. No boney deformities LAD: No inguinal or umbilical  LAD Extremities:  No clubbing or edema. Neurologic:  Alert and  oriented x4;  grossly nonfocal Skin:  No obvious rash or bruise. Psych:  Alert and cooperative. Normal mood and affect.       Momin Misko L. Tarri Glenn, MD, MPH 07/10/2021, 10:28 AM

## 2021-07-10 NOTE — Progress Notes (Signed)
Called to room to assist during endoscopic procedure.  Patient ID and intended procedure confirmed with present staff. Received instructions for my participation in the procedure from the performing physician.  

## 2021-07-15 ENCOUNTER — Telehealth: Payer: Self-pay

## 2021-07-15 NOTE — Telephone Encounter (Signed)
  Follow up Call-  Call back number 07/10/2021  Post procedure Call Back phone  # #[phone] cell  Permission to leave phone message Yes  Some recent data might be hidden     Patient questions:  Do you have a fever, pain , or abdominal swelling? No. Pain Score  0 *  Have you tolerated food without any problems? Yes.    Have you been able to return to your normal activities? Yes.    Do you have any questions about your discharge instructions: Diet   No. Medications  No. Follow up visit  No.  Do you have questions or concerns about your Care? No.  Actions: * If pain score is 4 or above: No action needed, pain <4.

## 2021-07-15 NOTE — Telephone Encounter (Signed)
First attempt follow up call to pt, no answer. 

## 2021-07-16 DIAGNOSIS — E538 Deficiency of other specified B group vitamins: Secondary | ICD-10-CM | POA: Diagnosis not present

## 2021-07-16 DIAGNOSIS — F5222 Female sexual arousal disorder: Secondary | ICD-10-CM | POA: Diagnosis not present

## 2021-07-17 DIAGNOSIS — E538 Deficiency of other specified B group vitamins: Secondary | ICD-10-CM | POA: Diagnosis not present

## 2021-07-17 DIAGNOSIS — F5222 Female sexual arousal disorder: Secondary | ICD-10-CM | POA: Diagnosis not present

## 2021-07-22 DIAGNOSIS — M5441 Lumbago with sciatica, right side: Secondary | ICD-10-CM | POA: Diagnosis not present

## 2021-07-24 DIAGNOSIS — F4323 Adjustment disorder with mixed anxiety and depressed mood: Secondary | ICD-10-CM | POA: Diagnosis not present

## 2021-07-24 DIAGNOSIS — F4310 Post-traumatic stress disorder, unspecified: Secondary | ICD-10-CM | POA: Diagnosis not present

## 2021-08-01 DIAGNOSIS — Z23 Encounter for immunization: Secondary | ICD-10-CM | POA: Diagnosis not present

## 2021-08-13 DIAGNOSIS — F5222 Female sexual arousal disorder: Secondary | ICD-10-CM | POA: Diagnosis not present

## 2021-08-13 DIAGNOSIS — E538 Deficiency of other specified B group vitamins: Secondary | ICD-10-CM | POA: Diagnosis not present

## 2021-08-28 DIAGNOSIS — F4323 Adjustment disorder with mixed anxiety and depressed mood: Secondary | ICD-10-CM | POA: Diagnosis not present

## 2021-08-28 DIAGNOSIS — F4310 Post-traumatic stress disorder, unspecified: Secondary | ICD-10-CM | POA: Diagnosis not present

## 2021-09-11 DIAGNOSIS — M5441 Lumbago with sciatica, right side: Secondary | ICD-10-CM | POA: Diagnosis not present

## 2021-09-12 DIAGNOSIS — F5222 Female sexual arousal disorder: Secondary | ICD-10-CM | POA: Diagnosis not present

## 2021-09-12 DIAGNOSIS — E538 Deficiency of other specified B group vitamins: Secondary | ICD-10-CM | POA: Diagnosis not present

## 2021-09-19 DIAGNOSIS — E063 Autoimmune thyroiditis: Secondary | ICD-10-CM | POA: Diagnosis not present

## 2021-09-19 DIAGNOSIS — E038 Other specified hypothyroidism: Secondary | ICD-10-CM | POA: Diagnosis not present

## 2021-09-24 DIAGNOSIS — E038 Other specified hypothyroidism: Secondary | ICD-10-CM | POA: Diagnosis not present

## 2021-09-24 DIAGNOSIS — E063 Autoimmune thyroiditis: Secondary | ICD-10-CM | POA: Diagnosis not present

## 2021-09-25 DIAGNOSIS — F4310 Post-traumatic stress disorder, unspecified: Secondary | ICD-10-CM | POA: Diagnosis not present

## 2021-09-25 DIAGNOSIS — F4323 Adjustment disorder with mixed anxiety and depressed mood: Secondary | ICD-10-CM | POA: Diagnosis not present

## 2021-10-10 DIAGNOSIS — E538 Deficiency of other specified B group vitamins: Secondary | ICD-10-CM | POA: Diagnosis not present

## 2021-10-10 DIAGNOSIS — F5222 Female sexual arousal disorder: Secondary | ICD-10-CM | POA: Diagnosis not present

## 2021-10-20 DIAGNOSIS — F5222 Female sexual arousal disorder: Secondary | ICD-10-CM | POA: Diagnosis not present

## 2021-10-20 DIAGNOSIS — E538 Deficiency of other specified B group vitamins: Secondary | ICD-10-CM | POA: Diagnosis not present

## 2021-10-23 DIAGNOSIS — F4323 Adjustment disorder with mixed anxiety and depressed mood: Secondary | ICD-10-CM | POA: Diagnosis not present

## 2021-10-23 DIAGNOSIS — F4310 Post-traumatic stress disorder, unspecified: Secondary | ICD-10-CM | POA: Diagnosis not present

## 2021-11-05 DIAGNOSIS — F4323 Adjustment disorder with mixed anxiety and depressed mood: Secondary | ICD-10-CM | POA: Diagnosis not present

## 2021-11-05 DIAGNOSIS — F4310 Post-traumatic stress disorder, unspecified: Secondary | ICD-10-CM | POA: Diagnosis not present

## 2021-11-07 DIAGNOSIS — E538 Deficiency of other specified B group vitamins: Secondary | ICD-10-CM | POA: Diagnosis not present

## 2021-11-07 DIAGNOSIS — F5222 Female sexual arousal disorder: Secondary | ICD-10-CM | POA: Diagnosis not present

## 2021-11-19 DIAGNOSIS — F4323 Adjustment disorder with mixed anxiety and depressed mood: Secondary | ICD-10-CM | POA: Diagnosis not present

## 2021-11-19 DIAGNOSIS — F4310 Post-traumatic stress disorder, unspecified: Secondary | ICD-10-CM | POA: Diagnosis not present

## 2021-12-03 DIAGNOSIS — F4310 Post-traumatic stress disorder, unspecified: Secondary | ICD-10-CM | POA: Diagnosis not present

## 2021-12-03 DIAGNOSIS — F4323 Adjustment disorder with mixed anxiety and depressed mood: Secondary | ICD-10-CM | POA: Diagnosis not present

## 2021-12-05 DIAGNOSIS — E538 Deficiency of other specified B group vitamins: Secondary | ICD-10-CM | POA: Diagnosis not present

## 2021-12-05 DIAGNOSIS — F5222 Female sexual arousal disorder: Secondary | ICD-10-CM | POA: Diagnosis not present

## 2021-12-17 DIAGNOSIS — F4323 Adjustment disorder with mixed anxiety and depressed mood: Secondary | ICD-10-CM | POA: Diagnosis not present

## 2021-12-17 DIAGNOSIS — L821 Other seborrheic keratosis: Secondary | ICD-10-CM | POA: Diagnosis not present

## 2021-12-17 DIAGNOSIS — Z85828 Personal history of other malignant neoplasm of skin: Secondary | ICD-10-CM | POA: Diagnosis not present

## 2021-12-17 DIAGNOSIS — F4310 Post-traumatic stress disorder, unspecified: Secondary | ICD-10-CM | POA: Diagnosis not present

## 2021-12-17 DIAGNOSIS — L578 Other skin changes due to chronic exposure to nonionizing radiation: Secondary | ICD-10-CM | POA: Diagnosis not present

## 2021-12-17 DIAGNOSIS — L570 Actinic keratosis: Secondary | ICD-10-CM | POA: Diagnosis not present

## 2021-12-17 DIAGNOSIS — L814 Other melanin hyperpigmentation: Secondary | ICD-10-CM | POA: Diagnosis not present

## 2021-12-18 DIAGNOSIS — Z79899 Other long term (current) drug therapy: Secondary | ICD-10-CM | POA: Diagnosis not present

## 2021-12-18 DIAGNOSIS — M5136 Other intervertebral disc degeneration, lumbar region: Secondary | ICD-10-CM | POA: Diagnosis not present

## 2021-12-18 DIAGNOSIS — M150 Primary generalized (osteo)arthritis: Secondary | ICD-10-CM | POA: Diagnosis not present

## 2021-12-18 DIAGNOSIS — M0579 Rheumatoid arthritis with rheumatoid factor of multiple sites without organ or systems involvement: Secondary | ICD-10-CM | POA: Diagnosis not present

## 2021-12-31 DIAGNOSIS — F4323 Adjustment disorder with mixed anxiety and depressed mood: Secondary | ICD-10-CM | POA: Diagnosis not present

## 2021-12-31 DIAGNOSIS — F4310 Post-traumatic stress disorder, unspecified: Secondary | ICD-10-CM | POA: Diagnosis not present

## 2022-01-05 DIAGNOSIS — E538 Deficiency of other specified B group vitamins: Secondary | ICD-10-CM | POA: Diagnosis not present

## 2022-01-05 DIAGNOSIS — F5222 Female sexual arousal disorder: Secondary | ICD-10-CM | POA: Diagnosis not present

## 2022-01-09 DIAGNOSIS — F4323 Adjustment disorder with mixed anxiety and depressed mood: Secondary | ICD-10-CM | POA: Diagnosis not present

## 2022-01-09 DIAGNOSIS — F4310 Post-traumatic stress disorder, unspecified: Secondary | ICD-10-CM | POA: Diagnosis not present

## 2022-01-12 DIAGNOSIS — L570 Actinic keratosis: Secondary | ICD-10-CM | POA: Diagnosis not present

## 2022-02-02 DIAGNOSIS — F5222 Female sexual arousal disorder: Secondary | ICD-10-CM | POA: Diagnosis not present

## 2022-02-02 DIAGNOSIS — E538 Deficiency of other specified B group vitamins: Secondary | ICD-10-CM | POA: Diagnosis not present

## 2022-02-19 DIAGNOSIS — H40013 Open angle with borderline findings, low risk, bilateral: Secondary | ICD-10-CM | POA: Diagnosis not present

## 2022-02-19 DIAGNOSIS — H2513 Age-related nuclear cataract, bilateral: Secondary | ICD-10-CM | POA: Diagnosis not present

## 2022-02-19 DIAGNOSIS — H35363 Drusen (degenerative) of macula, bilateral: Secondary | ICD-10-CM | POA: Diagnosis not present

## 2022-02-19 DIAGNOSIS — H40033 Anatomical narrow angle, bilateral: Secondary | ICD-10-CM | POA: Diagnosis not present

## 2022-03-04 DIAGNOSIS — E538 Deficiency of other specified B group vitamins: Secondary | ICD-10-CM | POA: Diagnosis not present

## 2022-03-04 DIAGNOSIS — F5222 Female sexual arousal disorder: Secondary | ICD-10-CM | POA: Diagnosis not present

## 2022-03-16 DIAGNOSIS — L570 Actinic keratosis: Secondary | ICD-10-CM | POA: Diagnosis not present

## 2022-04-27 DIAGNOSIS — E538 Deficiency of other specified B group vitamins: Secondary | ICD-10-CM | POA: Diagnosis not present

## 2022-04-27 DIAGNOSIS — F5222 Female sexual arousal disorder: Secondary | ICD-10-CM | POA: Diagnosis not present

## 2022-05-25 DIAGNOSIS — E538 Deficiency of other specified B group vitamins: Secondary | ICD-10-CM | POA: Diagnosis not present

## 2022-05-25 DIAGNOSIS — F5222 Female sexual arousal disorder: Secondary | ICD-10-CM | POA: Diagnosis not present

## 2022-06-24 DIAGNOSIS — R87610 Atypical squamous cells of undetermined significance on cytologic smear of cervix (ASC-US): Secondary | ICD-10-CM | POA: Diagnosis not present

## 2022-06-24 DIAGNOSIS — Z01419 Encounter for gynecological examination (general) (routine) without abnormal findings: Secondary | ICD-10-CM | POA: Diagnosis not present

## 2022-06-24 DIAGNOSIS — Z1231 Encounter for screening mammogram for malignant neoplasm of breast: Secondary | ICD-10-CM | POA: Diagnosis not present

## 2022-06-24 DIAGNOSIS — Z1272 Encounter for screening for malignant neoplasm of vagina: Secondary | ICD-10-CM | POA: Diagnosis not present

## 2022-06-26 DIAGNOSIS — F5222 Female sexual arousal disorder: Secondary | ICD-10-CM | POA: Diagnosis not present

## 2022-06-26 DIAGNOSIS — Z01419 Encounter for gynecological examination (general) (routine) without abnormal findings: Secondary | ICD-10-CM | POA: Diagnosis not present

## 2022-06-26 DIAGNOSIS — Z13220 Encounter for screening for lipoid disorders: Secondary | ICD-10-CM | POA: Diagnosis not present

## 2022-06-26 DIAGNOSIS — E538 Deficiency of other specified B group vitamins: Secondary | ICD-10-CM | POA: Diagnosis not present

## 2022-06-29 DIAGNOSIS — M0579 Rheumatoid arthritis with rheumatoid factor of multiple sites without organ or systems involvement: Secondary | ICD-10-CM | POA: Diagnosis not present

## 2022-06-29 DIAGNOSIS — M1991 Primary osteoarthritis, unspecified site: Secondary | ICD-10-CM | POA: Diagnosis not present

## 2022-06-29 DIAGNOSIS — Z79899 Other long term (current) drug therapy: Secondary | ICD-10-CM | POA: Diagnosis not present

## 2022-06-29 DIAGNOSIS — E538 Deficiency of other specified B group vitamins: Secondary | ICD-10-CM | POA: Diagnosis not present

## 2022-06-29 DIAGNOSIS — M5136 Other intervertebral disc degeneration, lumbar region: Secondary | ICD-10-CM | POA: Diagnosis not present

## 2022-06-29 DIAGNOSIS — F5222 Female sexual arousal disorder: Secondary | ICD-10-CM | POA: Diagnosis not present

## 2022-06-30 ENCOUNTER — Other Ambulatory Visit: Payer: Self-pay | Admitting: Obstetrics and Gynecology

## 2022-06-30 DIAGNOSIS — Z1231 Encounter for screening mammogram for malignant neoplasm of breast: Secondary | ICD-10-CM

## 2022-07-24 ENCOUNTER — Ambulatory Visit: Payer: Self-pay

## 2022-07-24 ENCOUNTER — Encounter (HOSPITAL_COMMUNITY): Payer: Self-pay

## 2022-07-24 ENCOUNTER — Ambulatory Visit (HOSPITAL_COMMUNITY)
Admission: EM | Admit: 2022-07-24 | Discharge: 2022-07-24 | Disposition: A | Discharge status: Home / Self Care | Payer: BC Managed Care – PPO | Attending: Family Medicine | Admitting: Family Medicine

## 2022-07-24 DIAGNOSIS — F5222 Female sexual arousal disorder: Secondary | ICD-10-CM | POA: Diagnosis not present

## 2022-07-24 DIAGNOSIS — L03113 Cellulitis of right upper limb: Secondary | ICD-10-CM | POA: Diagnosis not present

## 2022-07-24 DIAGNOSIS — E538 Deficiency of other specified B group vitamins: Secondary | ICD-10-CM | POA: Diagnosis not present

## 2022-07-24 MED ORDER — KETOROLAC TROMETHAMINE 30 MG/ML IJ SOLN
INTRAMUSCULAR | Status: AC
  Filled 2022-07-24: qty 1

## 2022-07-24 MED ORDER — KETOROLAC TROMETHAMINE 30 MG/ML IJ SOLN
30.0000 mg | Freq: Once | INTRAMUSCULAR | Status: AC
  Administered 2022-07-24: 30 mg via INTRAMUSCULAR

## 2022-07-24 MED ORDER — AMOXICILLIN-POT CLAVULANATE 875-125 MG PO TABS: 1.0000 | ORAL_TABLET | Freq: Two times a day (BID) | ORAL | 0 refills | Status: DC
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
## 2022-07-24 NOTE — Discharge Instructions (Addendum)
Take amoxicillin-clavulanate 875 mg--1 tab twice daily with food for 7 days  You have been given a shot of Toradol 30 mg today.  Do warm compresses on the sore area 2-3 times daily

## 2022-07-24 NOTE — ED Triage Notes (Signed)
Pt reports an abscess on her right arm. Pt noticed the bump a couple weeks ago, today she noticed some swelling around the area.

## 2022-07-25 ENCOUNTER — Emergency Department (HOSPITAL_COMMUNITY)
Admission: EM | Admit: 2022-07-25 | Discharge: 2022-07-25 | Disposition: A | Discharge status: Home / Self Care | Payer: BC Managed Care – PPO | Attending: Emergency Medicine | Admitting: Emergency Medicine

## 2022-07-25 ENCOUNTER — Encounter (HOSPITAL_COMMUNITY): Payer: Self-pay | Admitting: Emergency Medicine

## 2022-07-25 DIAGNOSIS — L03113 Cellulitis of right upper limb: Secondary | ICD-10-CM | POA: Diagnosis not present

## 2022-07-25 DIAGNOSIS — D72829 Elevated white blood cell count, unspecified: Secondary | ICD-10-CM | POA: Insufficient documentation

## 2022-07-25 LAB — COMPREHENSIVE METABOLIC PANEL
ALT: 15 U/L (ref 0–44)
AST: 20 U/L (ref 15–41)
Albumin: 3.7 g/dL (ref 3.5–5.0)
Alkaline Phosphatase: 35 U/L — ABNORMAL LOW (ref 38–126)
Anion gap: 9 (ref 5–15)
BUN: 19 mg/dL (ref 6–20)
CO2: 23 mmol/L (ref 22–32)
Calcium: 9.5 mg/dL (ref 8.9–10.3)
Chloride: 105 mmol/L (ref 98–111)
Creatinine, Ser: 0.9 mg/dL (ref 0.44–1.00)
GFR, Estimated: >60 mL/min (ref >60)
Glucose, Bld: 93 mg/dL (ref 70–99)
Potassium: 4.2 mmol/L (ref 3.5–5.1)
Sodium: 137 mmol/L (ref 135–145)
Total Bilirubin: 1.1 mg/dL (ref 0.3–1.2)
Total Protein: 6.6 g/dL (ref 6.5–8.1)

## 2022-07-25 LAB — CBC WITH DIFFERENTIAL/PLATELET
Abs Immature Granulocytes: 0.03 10*3/uL (ref 0.00–0.07)
Basophils Absolute: 0.1 10*3/uL (ref 0.0–0.1)
Basophils Relative: 1 %
Eosinophils Absolute: 0.1 10*3/uL (ref 0.0–0.5)
Eosinophils Relative: 1 %
HCT: 39.4 % (ref 36.0–46.0)
Hemoglobin: 13.8 g/dL (ref 12.0–15.0)
Immature Granulocytes: 0 %
Lymphocytes Relative: 14 %
Lymphs Abs: 1.8 10*3/uL (ref 0.7–4.0)
MCH: 33.5 pg (ref 26.0–34.0)
MCHC: 35 g/dL (ref 30.0–36.0)
MCV: 95.6 fL (ref 80.0–100.0)
Monocytes Absolute: 1.2 10*3/uL — ABNORMAL HIGH (ref 0.1–1.0)
Monocytes Relative: 9 %
Neutro Abs: 9.9 10*3/uL — ABNORMAL HIGH (ref 1.7–7.7)
Neutrophils Relative %: 75 %
Platelets: 198 10*3/uL (ref 150–400)
RBC: 4.12 MIL/uL (ref 3.87–5.11)
RDW: 12.2 % (ref 11.5–15.5)
WBC: 13.1 10*3/uL — ABNORMAL HIGH (ref 4.0–10.5)
nRBC: 0 % (ref 0.0–0.2)

## 2022-07-25 LAB — LACTIC ACID, PLASMA: Lactic Acid, Venous: 0.6 mmol/L (ref 0.5–1.9)

## 2022-07-25 MED ORDER — DOXYCYCLINE HYCLATE 100 MG PO CAPS: 100.0000 mg | ORAL_CAPSULE | Freq: Two times a day (BID) | ORAL | 0 refills | Status: DC

## 2022-07-25 NOTE — Discharge Instructions (Signed)
You can stop taking the Augmentin, please go ahead and start taking doxycycline twice a day.  Please be aware of the side effect of rituximab which may cause increasing rash when you are in the sunshine.  If that occurs please get out of the sunshine, and finish taking all of the medication.  You will likely see some improvement within the next 48 hours, if you do develop severe or worsening pain swelling or fevers then you may need to return for repeat evaluation but today your heart rate, your blood pressure, your temperature are all normal.  You may take ibuprofen or Tylenol as needed for pain  Thank you for allowing [place] to treat you in the emergency department today.  After reviewing your examination and potential testing that was done it appears that you are safe to go home.  I would like for you to follow-up with your doctor within the next several days, have them obtain your results and follow-up with them to review all of these tests.  If you should develop severe or worsening symptoms return to the emergency department immediately

## 2022-07-25 NOTE — ED Notes (Signed)
Cellulitis marked with surgical pin

## 2022-07-25 NOTE — ED Triage Notes (Addendum)
Patient here for cellulitis in right forearm, sent to ED from urgent care. Patient is alert, oriented, ambulatory, and in no apparent distress at this time.
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
## 2022-07-27 ENCOUNTER — Emergency Department (HOSPITAL_BASED_OUTPATIENT_CLINIC_OR_DEPARTMENT_OTHER): Payer: BC Managed Care – PPO | Admitting: Radiology

## 2022-07-27 ENCOUNTER — Telehealth: Payer: BC Managed Care – PPO | Admitting: Physician Assistant

## 2022-07-27 ENCOUNTER — Other Ambulatory Visit (HOSPITAL_BASED_OUTPATIENT_CLINIC_OR_DEPARTMENT_OTHER): Payer: Self-pay

## 2022-07-27 ENCOUNTER — Emergency Department (HOSPITAL_BASED_OUTPATIENT_CLINIC_OR_DEPARTMENT_OTHER)
Admission: EM | Admit: 2022-07-27 | Discharge: 2022-07-27 | Disposition: A | Discharge status: Home / Self Care | Payer: BC Managed Care – PPO | Attending: Emergency Medicine | Admitting: Emergency Medicine

## 2022-07-27 ENCOUNTER — Other Ambulatory Visit: Payer: Self-pay

## 2022-07-27 ENCOUNTER — Encounter (HOSPITAL_BASED_OUTPATIENT_CLINIC_OR_DEPARTMENT_OTHER): Payer: Self-pay | Admitting: Emergency Medicine

## 2022-07-27 DIAGNOSIS — L03113 Cellulitis of right upper limb: Secondary | ICD-10-CM

## 2022-07-27 DIAGNOSIS — Z79899 Other long term (current) drug therapy: Secondary | ICD-10-CM | POA: Diagnosis not present

## 2022-07-27 DIAGNOSIS — L089 Local infection of the skin and subcutaneous tissue, unspecified: Secondary | ICD-10-CM | POA: Diagnosis not present

## 2022-07-27 DIAGNOSIS — E039 Hypothyroidism, unspecified: Secondary | ICD-10-CM | POA: Insufficient documentation

## 2022-07-27 DIAGNOSIS — D72829 Elevated white blood cell count, unspecified: Secondary | ICD-10-CM | POA: Diagnosis not present

## 2022-07-27 DIAGNOSIS — R001 Bradycardia, unspecified: Secondary | ICD-10-CM | POA: Insufficient documentation

## 2022-07-27 DIAGNOSIS — R509 Fever, unspecified: Secondary | ICD-10-CM | POA: Diagnosis not present

## 2022-07-27 LAB — CBC WITH DIFFERENTIAL/PLATELET
Abs Immature Granulocytes: 0.05 10*3/uL (ref 0.00–0.07)
Basophils Absolute: 0.1 10*3/uL (ref 0.0–0.1)
Basophils Relative: 0 %
Eosinophils Absolute: 0.2 10*3/uL (ref 0.0–0.5)
Eosinophils Relative: 2 %
HCT: 43.3 % (ref 36.0–46.0)
Hemoglobin: 14.8 g/dL (ref 12.0–15.0)
Immature Granulocytes: 0 %
Lymphocytes Relative: 26 %
Lymphs Abs: 3 10*3/uL (ref 0.7–4.0)
MCH: 32.8 pg (ref 26.0–34.0)
MCHC: 34.2 g/dL (ref 30.0–36.0)
MCV: 96 fL (ref 80.0–100.0)
Monocytes Absolute: 0.9 10*3/uL (ref 0.1–1.0)
Monocytes Relative: 8 %
Neutro Abs: 7.5 10*3/uL (ref 1.7–7.7)
Neutrophils Relative %: 64 %
Platelets: 225 10*3/uL (ref 150–400)
RBC: 4.51 MIL/uL (ref 3.87–5.11)
RDW: 12.4 % (ref 11.5–15.5)
WBC: 11.7 10*3/uL — ABNORMAL HIGH (ref 4.0–10.5)
nRBC: 0 % (ref 0.0–0.2)

## 2022-07-27 LAB — URINALYSIS, ROUTINE W REFLEX MICROSCOPIC
Bilirubin Urine: NEGATIVE
Glucose, UA: NEGATIVE mg/dL
Ketones, ur: NEGATIVE mg/dL
Leukocytes,Ua: NEGATIVE
Nitrite: NEGATIVE
Specific Gravity, Urine: 1.024 (ref 1.005–1.030)
pH: 5.5 (ref 5.0–8.0)

## 2022-07-27 LAB — COMPREHENSIVE METABOLIC PANEL
ALT: 19 U/L (ref 0–44)
AST: 21 U/L (ref 15–41)
Albumin: 4.8 g/dL (ref 3.5–5.0)
Alkaline Phosphatase: 46 U/L (ref 38–126)
Anion gap: 10 (ref 5–15)
BUN: 15 mg/dL (ref 6–20)
CO2: 26 mmol/L (ref 22–32)
Calcium: 10 mg/dL (ref 8.9–10.3)
Chloride: 99 mmol/L (ref 98–111)
Creatinine, Ser: 0.83 mg/dL (ref 0.44–1.00)
GFR, Estimated: >60 mL/min (ref >60)
Glucose, Bld: 90 mg/dL (ref 70–99)
Potassium: 4.3 mmol/L (ref 3.5–5.1)
Sodium: 135 mmol/L (ref 135–145)
Total Bilirubin: 0.9 mg/dL (ref 0.3–1.2)
Total Protein: 8.1 g/dL (ref 6.5–8.1)

## 2022-07-27 LAB — PREGNANCY, URINE: Preg Test, Ur: NEGATIVE

## 2022-07-27 LAB — LACTIC ACID, PLASMA
Lactic Acid, Venous: 0.9 mmol/L (ref 0.5–1.9)
Lactic Acid, Venous: 0.5 mmol/L (ref 0.5–1.9)

## 2022-07-27 MED ORDER — DEXTROSE 5 % IV SOLN
1500.0000 mg | Freq: Once | INTRAVENOUS | Status: AC
  Administered 2022-07-27: 1500 mg via INTRAVENOUS
  Filled 2022-07-27: qty 75
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
## 2022-07-27 NOTE — Progress Notes (Signed)
Virtual Visit Consent   Tina Olsen, you are scheduled for a virtual visit with a [facility] provider today. Just as with appointments in the office, your consent must be obtained to participate. Your consent will be active for this visit and any virtual visit you may have with one of our providers in the next 365 days. If you have a MyChart account, a copy of this consent can be sent to you electronically.  As this is a virtual visit, video technology does not allow for your provider to perform a traditional examination. This may limit your provider's ability to fully assess your condition. If your provider identifies any concerns that need to be evaluated in person or the need to arrange testing (such as labs, EKG, etc.), we will make arrangements to do so. Although advances in technology are sophisticated, we cannot ensure that it will always work on either your end or our end. If the connection with a video visit is poor, the visit may have to be switched to a telephone visit. With either a video or telephone visit, we are not always able to ensure that we have a secure connection.  By engaging in this virtual visit, you consent to the provision of healthcare and authorize for your insurance to be billed (if applicable) for the services provided during this visit. Depending on your insurance coverage, you may receive a charge related to this service.  I need to obtain your verbal consent now. Are you willing to proceed with your visit today? Chaniyah JEWELLE WHITNER has provided verbal consent on 07/27/2022 for a virtual visit (video or telephone). Tina Olsen, [place]  Date: 07/27/2022 10:46 AM  Virtual Visit via Video Note   I, Tina Olsen, connected with  [name]  ([id number], 04/30/1961) on 07/27/22 at 11:00 AM EDT by a video-enabled telemedicine application and verified that I am speaking with the correct person using two identifiers.  Location: Patient: Virtual  Visit Location Patient: Home Provider: Virtual Visit Location Provider: Mobile   I discussed the limitations of evaluation and management by telemedicine and the availability of in person appointments. The patient expressed understanding and agreed to proceed.    History of Present Illness: Tina Olsen is a 61 y.o. who identifies as a female who was assigned female at birth, and is being seen today for worsening cellulitis of R forearm. Was initially seen 3 days ago at urgent care and started on Augmentin. Was told if anything worsening to be valuated at the ER. Notes the area of redness continued to progress despite antibiotic so was evaluated on the 16th at [facility] ER. CBC with mild leukocytosis. Normal lactic acid. Oral antibiotics changed to Doxycycline and patient sent home to complete course of oral antibiotics. Notes taking medications as directed but continued progression of area of redness now with low-grade fevers and general malaise. Is not sure what to do at this point. Notes normal range of motion at elbow but harder to fully flex due to amount of swelling in the forearm itself.   HPI: HPI  Problems:  Patient Active Problem List   Diagnosis Date Noted   Scoliosis of lumbar spine, unspecified scoliosis type 05/15/2021   Carpal tunnel syndrome of right wrist 12/25/2020   Paresthesia 12/25/2020   Rheumatoid arthritis ([place]) 01/07/2018   Allergic rhinitis due to allergen 01/22/2017   Elevated TSH 02/13/2016    Allergies:  Allergies  Allergen Reactions   Sulfa Antibiotics Rash  Medications:  Current Outpatient Medications:    alendronate (FOSAMAX) 70 MG tablet, Take 70 mg by mouth every Monday. Take with a full glass of water on an empty stomach. (Patient not taking: No sig reported), Disp: , Rfl:    Biotin 5 MG CAPS, Take 5 mg by mouth daily., Disp: , Rfl:    cetirizine (ZYRTEC) 10 MG tablet, Take 10 mg by mouth 2 (two) times daily., Disp: , Rfl:    Cholecalciferol  (VITAMIN D) 50 MCG (2000 UT) tablet, Take 2,000 Units by mouth daily., Disp: , Rfl:    cyanocobalamin (,VITAMIN B-12,) 1000 MCG/ML injection, Inject 1,000 mcg into the muscle every 30 (thirty) days., Disp: , Rfl: 6   diclofenac (VOLTAREN) 75 MG EC tablet, Take 1 tablet by mouth daily., Disp: , Rfl:    doxycycline (VIBRAMYCIN) 100 MG capsule, Take 1 capsule (100 mg total) by mouth 2 (two) times daily., Disp: 20 capsule, Rfl: 0   estradiol (ESTRACE) 1 MG tablet, Take 1 mg by mouth daily., Disp: , Rfl:    levothyroxine (SYNTHROID, LEVOTHROID) 75 MCG tablet, Take 75 mcg by mouth daily before breakfast., Disp: , Rfl:    MAGNESIUM CITRATE PO, Take 600 mg by mouth daily., Disp: , Rfl:    polyethylene glycol powder (GLYCOLAX/MIRALAX) 17 GM/SCOOP powder, Dissolve 1 capful in 8 ounces of water or juice. Take daily to BID, Disp: 255 g, Rfl: 3   testosterone cypionate (DEPOTESTOSTERONE CYPIONATE) 200 MG/ML injection, Inject into the muscle every 30 (thirty) days., Disp: , Rfl:   Current Facility-Administered Medications:    0.9 %  sodium chloride infusion, 500 mL, Intravenous, Once, Thornton Park, MD  Observations/Objective: Patient is well-developed, well-nourished in no acute distress.  Resting comfortably at home.  Head is normocephalic, atraumatic.  No labored breathing. Speech is clear and coherent with logical content.  Patient is alert and oriented at baseline.  R forearm noted with moderate swelling and substantial area of erythema that has now passed to separate lines drawn as prior borders of the cellulitis.  Assessment and Plan: 1. Cellulitis of forearm, right  Worsening despite antibiotics. Now with fever. Thankfully ROM is good and no evidence of bursal swelling so less concerned with septic joint. Still giving deterioration and fever, needs to be considered for IV antibiotics and repeat in-person assessment. Sent patient to [facility] ER for care.   Follow Up Instructions: I  discussed the assessment and treatment plan with the patient. The patient was provided an opportunity to ask questions and all were answered. The patient agreed with the plan and demonstrated an understanding of the instructions.  A copy of instructions were sent to the patient via MyChart unless otherwise noted below.   The patient was advised to call back or seek an in-person evaluation if the symptoms worsen or if the condition fails to improve as anticipated.  Time:  I spent 10 minutes with the patient via telehealth technology discussing the above problems/concerns.    Tina Rio, PA-C

## 2022-07-27 NOTE — Patient Instructions (Signed)
  [name], thank you for joining [name], PA-C for today's virtual visit.  While this provider is not your primary care provider (PCP), if your PCP is located in our provider database this encounter information will be shared with them immediately following your visit.  Consent: (Patient) Tina Olsen provided verbal consent for this virtual visit at the beginning of the encounter.  Current Medications:  Current Outpatient Medications:    alendronate (FOSAMAX) 70 MG tablet, Take 70 mg by mouth every Monday. Take with a full glass of water on an empty stomach. (Patient not taking: No sig reported), Disp: , Rfl:    Biotin 5 MG CAPS, Take 5 mg by mouth daily., Disp: , Rfl:    cetirizine (ZYRTEC) 10 MG tablet, Take 10 mg by mouth 2 (two) times daily., Disp: , Rfl:    Cholecalciferol (VITAMIN D) 50 MCG (2000 UT) tablet, Take 2,000 Units by mouth daily., Disp: , Rfl:    cyanocobalamin (,VITAMIN B-12,) 1000 MCG/ML injection, Inject 1,000 mcg into the muscle every 30 (thirty) days., Disp: , Rfl: 6   diclofenac (VOLTAREN) 75 MG EC tablet, Take 1 tablet by mouth daily., Disp: , Rfl:    doxycycline (VIBRAMYCIN) 100 MG capsule, Take 1 capsule (100 mg total) by mouth 2 (two) times daily., Disp: 20 capsule, Rfl: 0   estradiol (ESTRACE) 1 MG tablet, Take 1 mg by mouth daily., Disp: , Rfl:    levothyroxine (SYNTHROID, LEVOTHROID) 75 MCG tablet, Take 75 mcg by mouth daily before breakfast., Disp: , Rfl:    MAGNESIUM CITRATE PO, Take 600 mg by mouth daily., Disp: , Rfl:    polyethylene glycol powder (GLYCOLAX/MIRALAX) 17 GM/SCOOP powder, Dissolve 1 capful in 8 ounces of water or juice. Take daily to BID, Disp: 255 g, Rfl: 3   testosterone cypionate (DEPOTESTOSTERONE CYPIONATE) 200 MG/ML injection, Inject into the muscle every 30 (thirty) days., Disp: , Rfl:   Current Facility-Administered Medications:    0.9 %  sodium chloride infusion, 500 mL, Intravenous, Once, [place],  MD   Medications ordered in this encounter:  No orders of the defined types were placed in this encounter.    *If you need refills on other medications prior to your next appointment, please contact your pharmacy*  Follow-Up: Call back or seek an in-person evaluation if the symptoms worsen or if the condition fails to improve as anticipated.  Other Instructions Please go to our [facility] for further management as discussed.    [facility]  Get Driving Directions  [address],  [address]  Open 24/7/365    If you or a family member do not have a primary care provider, use the link below to schedule a visit and establish care. When you choose a [facility] primary care physician or advanced practice provider, you gain a long-term partner in health. Find a Primary Care Provider  Learn more about [facility] in-office and virtual care options: [facility]

## 2022-07-27 NOTE — Progress Notes (Signed)
Pharmacy Note: dalbavancin (DALVANCE) for Acute Bacterial Skin and Skin Structure Infection (ABSSSI) Patients to [facility] Discharge   Srihitha DESHAWNA MCNEECE is a 61 y.o. female who presented to [facility] on 07/27/2022 with an ABSSSI.  Inclusion criteria:  Indication: Cellulitis  Patient has at least one SIRS criteria present: WBC > 12,000 or < 4,000  Patient has failed multiple oral antibiotics.   Exclusion criteria: Patient was evaluated and negative for hardware involvement, hypotension / shock, elevated lactate > 2 without other explanation, gram-negative infection risk factors (bites, water exposure, infection after trauma, infection after skin graft, burns, severe immunocompromise), necrotizing fasciitis possible / confirmed, known or suspected osteomyelitis or septic arthritis, endocarditis, diabetic foot infection, ischemic ulcers, post-operative wound infection, perirectal infection, need for drainage in the OR, hand / facial infections, injection drug users with  fever, bacteremia, pregnancy or breastfeeding, allergy related to antibiotics like vancomycin, known liver disease ( T.Bili > 2x ULN or AST/ALT > 3x ULN).  Mickayla Trouten, Rande Lawman 07/27/2022, 1:47 PM Clinical Pharmacist
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
## 2022-07-27 NOTE — Discharge Instructions (Addendum)
Today you were seen in the emergency department for your cellulitis.    In the emergency department you were given an antibiotic called dalbavancin.  This is a long acting antibiotic so you will not need to take it at home.   At home, please take tylenol and motrin as needed for pain and discomfort.     Follow-up with your primary doctor in 2-3 days regarding your visit and for a skin check.  Infectious disease will also call you about setting up an appointment.   Return immediately to the emergency department if you experience any of the following: worsening fevers, spreading of the redness of your arm, severe pain, or any other concerning symptoms.    Thank you for visiting our Emergency Department. It was a pleasure taking care of you today.

## 2022-08-04 ENCOUNTER — Ambulatory Visit: Payer: BC Managed Care – PPO | Admitting: Infectious Diseases

## 2022-08-04 ENCOUNTER — Encounter: Payer: Self-pay | Admitting: Infectious Diseases

## 2022-08-04 ENCOUNTER — Other Ambulatory Visit: Payer: Self-pay

## 2022-08-04 VITALS — BP 113/69 | HR 94 | Resp 16 | Ht 63.0 in | Wt 134.0 lb

## 2022-08-04 DIAGNOSIS — Z5181 Encounter for therapeutic drug level monitoring: Secondary | ICD-10-CM

## 2022-08-04 DIAGNOSIS — D72829 Elevated white blood cell count, unspecified: Secondary | ICD-10-CM | POA: Diagnosis not present

## 2022-08-04 DIAGNOSIS — L03113 Cellulitis of right upper limb: Secondary | ICD-10-CM | POA: Diagnosis not present

## 2022-08-04 MED ORDER — LINEZOLID 600 MG PO TABS: 600.0000 mg | ORAL_TABLET | Freq: Two times a day (BID) | ORAL | 0 refills | Status: AC
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
## 2022-08-11 ENCOUNTER — Other Ambulatory Visit: Payer: Self-pay

## 2022-08-11 ENCOUNTER — Ambulatory Visit: Payer: BC Managed Care – PPO | Admitting: Infectious Diseases

## 2022-08-11 ENCOUNTER — Encounter: Payer: Self-pay | Admitting: Infectious Diseases

## 2022-08-11 VITALS — BP 102/67 | HR 52 | Temp 97.8°F | Ht 62.0 in | Wt 134.0 lb

## 2022-08-11 DIAGNOSIS — L03113 Cellulitis of right upper limb: Secondary | ICD-10-CM

## 2022-08-11 DIAGNOSIS — Z5181 Encounter for therapeutic drug level monitoring: Secondary | ICD-10-CM | POA: Diagnosis not present
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
## 2022-08-21 DIAGNOSIS — E538 Deficiency of other specified B group vitamins: Secondary | ICD-10-CM | POA: Diagnosis not present

## 2022-08-21 DIAGNOSIS — F5222 Female sexual arousal disorder: Secondary | ICD-10-CM | POA: Diagnosis not present

## 2022-09-15 DIAGNOSIS — E038 Other specified hypothyroidism: Secondary | ICD-10-CM | POA: Diagnosis not present

## 2022-09-15 DIAGNOSIS — E538 Deficiency of other specified B group vitamins: Secondary | ICD-10-CM | POA: Diagnosis not present

## 2022-09-15 DIAGNOSIS — F5222 Female sexual arousal disorder: Secondary | ICD-10-CM | POA: Diagnosis not present

## 2022-09-15 DIAGNOSIS — N9489 Other specified conditions associated with female genital organs and menstrual cycle: Secondary | ICD-10-CM | POA: Diagnosis not present

## 2022-09-15 DIAGNOSIS — E063 Autoimmune thyroiditis: Secondary | ICD-10-CM | POA: Diagnosis not present

## 2022-09-15 DIAGNOSIS — R87810 Cervical high risk human papillomavirus (HPV) DNA test positive: Secondary | ICD-10-CM | POA: Diagnosis not present

## 2022-09-23 DIAGNOSIS — E063 Autoimmune thyroiditis: Secondary | ICD-10-CM | POA: Diagnosis not present

## 2022-09-23 DIAGNOSIS — E038 Other specified hypothyroidism: Secondary | ICD-10-CM | POA: Diagnosis not present

## 2022-09-25 DIAGNOSIS — H40013 Open angle with borderline findings, low risk, bilateral: Secondary | ICD-10-CM | POA: Diagnosis not present

## 2022-09-25 DIAGNOSIS — H5319 Other subjective visual disturbances: Secondary | ICD-10-CM | POA: Diagnosis not present

## 2022-09-25 DIAGNOSIS — G43B1 Ophthalmoplegic migraine, intractable: Secondary | ICD-10-CM | POA: Diagnosis not present

## 2022-10-13 DIAGNOSIS — E538 Deficiency of other specified B group vitamins: Secondary | ICD-10-CM | POA: Diagnosis not present

## 2022-10-13 DIAGNOSIS — F5222 Female sexual arousal disorder: Secondary | ICD-10-CM | POA: Diagnosis not present

## 2022-11-10 DIAGNOSIS — F5222 Female sexual arousal disorder: Secondary | ICD-10-CM | POA: Diagnosis not present

## 2022-11-10 DIAGNOSIS — E538 Deficiency of other specified B group vitamins: Secondary | ICD-10-CM | POA: Diagnosis not present

## 2022-11-17 ENCOUNTER — Ambulatory Visit
Admission: RE | Admit: 2022-11-17 | Discharge: 2022-11-17 | Disposition: A | Discharge status: Home / Self Care | Payer: BC Managed Care – PPO | Source: Ambulatory Visit | Attending: Obstetrics and Gynecology | Admitting: Obstetrics and Gynecology

## 2022-11-17 DIAGNOSIS — Z1231 Encounter for screening mammogram for malignant neoplasm of breast: Secondary | ICD-10-CM | POA: Diagnosis not present

## 2022-11-26 DIAGNOSIS — F4323 Adjustment disorder with mixed anxiety and depressed mood: Secondary | ICD-10-CM | POA: Diagnosis not present

## 2022-11-26 DIAGNOSIS — F4310 Post-traumatic stress disorder, unspecified: Secondary | ICD-10-CM | POA: Diagnosis not present

## 2022-12-09 DIAGNOSIS — F4310 Post-traumatic stress disorder, unspecified: Secondary | ICD-10-CM | POA: Diagnosis not present

## 2022-12-09 DIAGNOSIS — F4323 Adjustment disorder with mixed anxiety and depressed mood: Secondary | ICD-10-CM | POA: Diagnosis not present

## 2022-12-11 DIAGNOSIS — F5222 Female sexual arousal disorder: Secondary | ICD-10-CM | POA: Diagnosis not present

## 2022-12-11 DIAGNOSIS — E538 Deficiency of other specified B group vitamins: Secondary | ICD-10-CM | POA: Diagnosis not present

## 2022-12-15 DIAGNOSIS — F5222 Female sexual arousal disorder: Secondary | ICD-10-CM | POA: Diagnosis not present

## 2022-12-27 ENCOUNTER — Emergency Department (HOSPITAL_COMMUNITY): Payer: BC Managed Care – PPO

## 2022-12-27 ENCOUNTER — Other Ambulatory Visit: Payer: Self-pay

## 2022-12-27 ENCOUNTER — Observation Stay (HOSPITAL_COMMUNITY)
Admission: EM | Admit: 2022-12-27 | Discharge: 2022-12-28 | Disposition: A | Discharge status: Home / Self Care | Payer: BC Managed Care – PPO | Attending: Surgery | Admitting: Surgery

## 2022-12-27 ENCOUNTER — Encounter (HOSPITAL_COMMUNITY): Payer: Self-pay

## 2022-12-27 DIAGNOSIS — S0181XA Laceration without foreign body of other part of head, initial encounter: Secondary | ICD-10-CM | POA: Insufficient documentation

## 2022-12-27 DIAGNOSIS — Z041 Encounter for examination and observation following transport accident: Secondary | ICD-10-CM | POA: Diagnosis not present

## 2022-12-27 DIAGNOSIS — S0101XA Laceration without foreign body of scalp, initial encounter: Secondary | ICD-10-CM | POA: Diagnosis not present

## 2022-12-27 DIAGNOSIS — S62172A Displaced fracture of trapezium [larger multangular], left wrist, initial encounter for closed fracture: Secondary | ICD-10-CM | POA: Diagnosis not present

## 2022-12-27 DIAGNOSIS — S62242A Displaced fracture of shaft of first metacarpal bone, left hand, initial encounter for closed fracture: Secondary | ICD-10-CM | POA: Diagnosis not present

## 2022-12-27 DIAGNOSIS — Z79899 Other long term (current) drug therapy: Secondary | ICD-10-CM | POA: Insufficient documentation

## 2022-12-27 DIAGNOSIS — S2241XA Multiple fractures of ribs, right side, initial encounter for closed fracture: Principal | ICD-10-CM | POA: Insufficient documentation

## 2022-12-27 DIAGNOSIS — R0789 Other chest pain: Secondary | ICD-10-CM | POA: Diagnosis not present

## 2022-12-27 DIAGNOSIS — M5030 Other cervical disc degeneration, unspecified cervical region: Secondary | ICD-10-CM | POA: Diagnosis not present

## 2022-12-27 DIAGNOSIS — S199XXA Unspecified injury of neck, initial encounter: Secondary | ICD-10-CM | POA: Diagnosis not present

## 2022-12-27 DIAGNOSIS — M2578 Osteophyte, vertebrae: Secondary | ICD-10-CM | POA: Diagnosis not present

## 2022-12-27 DIAGNOSIS — R102 Pelvic and perineal pain: Secondary | ICD-10-CM | POA: Diagnosis not present

## 2022-12-27 DIAGNOSIS — Z87891 Personal history of nicotine dependence: Secondary | ICD-10-CM | POA: Diagnosis not present

## 2022-12-27 DIAGNOSIS — M25561 Pain in right knee: Secondary | ICD-10-CM | POA: Diagnosis not present

## 2022-12-27 DIAGNOSIS — S3991XA Unspecified injury of abdomen, initial encounter: Secondary | ICD-10-CM | POA: Diagnosis not present

## 2022-12-27 DIAGNOSIS — Z90710 Acquired absence of both cervix and uterus: Secondary | ICD-10-CM | POA: Diagnosis not present

## 2022-12-27 DIAGNOSIS — E039 Hypothyroidism, unspecified: Secondary | ICD-10-CM | POA: Diagnosis not present

## 2022-12-27 DIAGNOSIS — K5730 Diverticulosis of large intestine without perforation or abscess without bleeding: Secondary | ICD-10-CM | POA: Diagnosis not present

## 2022-12-27 DIAGNOSIS — Z85828 Personal history of other malignant neoplasm of skin: Secondary | ICD-10-CM | POA: Diagnosis not present

## 2022-12-27 DIAGNOSIS — S0993XA Unspecified injury of face, initial encounter: Secondary | ICD-10-CM | POA: Diagnosis not present

## 2022-12-27 DIAGNOSIS — R9431 Abnormal electrocardiogram [ECG] [EKG]: Secondary | ICD-10-CM | POA: Diagnosis not present

## 2022-12-27 DIAGNOSIS — S63105A Unspecified dislocation of left thumb, initial encounter: Secondary | ICD-10-CM

## 2022-12-27 DIAGNOSIS — S62232A Other displaced fracture of base of first metacarpal bone, left hand, initial encounter for closed fracture: Secondary | ICD-10-CM | POA: Diagnosis not present

## 2022-12-27 DIAGNOSIS — S63055A Dislocation of other carpometacarpal joint of left hand, initial encounter: Secondary | ICD-10-CM | POA: Diagnosis not present

## 2022-12-27 DIAGNOSIS — Z23 Encounter for immunization: Secondary | ICD-10-CM | POA: Diagnosis not present

## 2022-12-27 DIAGNOSIS — I700 Atherosclerosis of aorta: Secondary | ICD-10-CM | POA: Diagnosis not present

## 2022-12-27 DIAGNOSIS — S2231XA Fracture of one rib, right side, initial encounter for closed fracture: Secondary | ICD-10-CM | POA: Diagnosis not present

## 2022-12-27 DIAGNOSIS — S6292XA Unspecified fracture of left wrist and hand, initial encounter for closed fracture: Secondary | ICD-10-CM | POA: Diagnosis not present

## 2022-12-27 DIAGNOSIS — S2249XA Multiple fractures of ribs, unspecified side, initial encounter for closed fracture: Secondary | ICD-10-CM | POA: Diagnosis present

## 2022-12-27 DIAGNOSIS — S2242XA Multiple fractures of ribs, left side, initial encounter for closed fracture: Secondary | ICD-10-CM | POA: Diagnosis not present

## 2022-12-27 DIAGNOSIS — K458 Other specified abdominal hernia without obstruction or gangrene: Secondary | ICD-10-CM | POA: Diagnosis not present

## 2022-12-27 DIAGNOSIS — S63045A Dislocation of carpometacarpal joint of left thumb, initial encounter: Secondary | ICD-10-CM | POA: Diagnosis not present

## 2022-12-27 DIAGNOSIS — S0083XA Contusion of other part of head, initial encounter: Secondary | ICD-10-CM | POA: Diagnosis not present

## 2022-12-27 DIAGNOSIS — S01511A Laceration without foreign body of lip, initial encounter: Secondary | ICD-10-CM | POA: Diagnosis not present

## 2022-12-27 LAB — I-STAT CHEM 8, ED
BUN: 18 mg/dL (ref 6–20)
Calcium, Ion: 0.94 mmol/L — ABNORMAL LOW (ref 1.15–1.40)
Chloride: 110 mmol/L (ref 98–111)
Creatinine, Ser: 0.6 mg/dL (ref 0.44–1.00)
Glucose, Bld: 81 mg/dL (ref 70–99)
HCT: 34 % — ABNORMAL LOW (ref 36.0–46.0)
Hemoglobin: 11.6 g/dL — ABNORMAL LOW (ref 12.0–15.0)
Potassium: 3.7 mmol/L (ref 3.5–5.1)
Sodium: 140 mmol/L (ref 135–145)
TCO2: 21 mmol/L — ABNORMAL LOW (ref 22–32)

## 2022-12-27 LAB — COMPREHENSIVE METABOLIC PANEL
ALT: 51 U/L — ABNORMAL HIGH (ref 0–44)
AST: 70 U/L — ABNORMAL HIGH (ref 15–41)
Albumin: 2.7 g/dL — ABNORMAL LOW (ref 3.5–5.0)
Alkaline Phosphatase: 28 U/L — ABNORMAL LOW (ref 38–126)
Anion gap: 7 (ref 5–15)
BUN: 14 mg/dL (ref 6–20)
CO2: 18 mmol/L — ABNORMAL LOW (ref 22–32)
Calcium: 6.8 mg/dL — ABNORMAL LOW (ref 8.9–10.3)
Chloride: 114 mmol/L — ABNORMAL HIGH (ref 98–111)
Creatinine, Ser: 0.6 mg/dL (ref 0.44–1.00)
GFR, Estimated: >60 mL/min (ref >60)
Glucose, Bld: 79 mg/dL (ref 70–99)
Potassium: 3.1 mmol/L — ABNORMAL LOW (ref 3.5–5.1)
Sodium: 139 mmol/L (ref 135–145)
Total Bilirubin: 0.3 mg/dL (ref 0.3–1.2)
Total Protein: 4.7 g/dL — ABNORMAL LOW (ref 6.5–8.1)

## 2022-12-27 LAB — URINALYSIS, ROUTINE W REFLEX MICROSCOPIC
Bilirubin Urine: NEGATIVE
Glucose, UA: NEGATIVE mg/dL
Hgb urine dipstick: NEGATIVE
Ketones, ur: NEGATIVE mg/dL
Leukocytes,Ua: NEGATIVE
Nitrite: NEGATIVE
Protein, ur: NEGATIVE mg/dL
Specific Gravity, Urine: <1.005 — ABNORMAL LOW (ref 1.005–1.030)
pH: 6.5 (ref 5.0–8.0)

## 2022-12-27 LAB — LACTIC ACID, PLASMA: Lactic Acid, Venous: 1.4 mmol/L (ref 0.5–1.9)

## 2022-12-27 LAB — CBC
HCT: 34.7 % — ABNORMAL LOW (ref 36.0–46.0)
Hemoglobin: 11.2 g/dL — ABNORMAL LOW (ref 12.0–15.0)
MCH: 32.1 pg (ref 26.0–34.0)
MCHC: 32.3 g/dL (ref 30.0–36.0)
MCV: 99.4 fL (ref 80.0–100.0)
Platelets: 165 10*3/uL (ref 150–400)
RBC: 3.49 MIL/uL — ABNORMAL LOW (ref 3.87–5.11)
RDW: 12.9 % (ref 11.5–15.5)
WBC: 11.4 10*3/uL — ABNORMAL HIGH (ref 4.0–10.5)
nRBC: 0 % (ref 0.0–0.2)

## 2022-12-27 MED ORDER — FENTANYL CITRATE PF 50 MCG/ML IJ SOSY
50.0000 ug | PREFILLED_SYRINGE | Freq: Once | INTRAMUSCULAR | Status: AC
  Administered 2022-12-27: 50 ug via INTRAVENOUS
  Filled 2022-12-27: qty 1

## 2022-12-27 MED ORDER — LIDOCAINE HCL (PF) 1 % IJ SOLN: 10.0000 mL | Freq: Once | INTRAMUSCULAR | Status: AC

## 2022-12-27 MED ORDER — LIDOCAINE-EPINEPHRINE 1 %-1:100000 IJ SOLN
10.0000 mL | Freq: Once | INTRAMUSCULAR | Status: AC
  Administered 2022-12-27: 10 mL via INTRADERMAL
  Filled 2022-12-27: qty 1

## 2022-12-27 MED ORDER — MORPHINE SULFATE (PF) 2 MG/ML IV SOLN
2.0000 mg | INTRAVENOUS | Status: DC | PRN
  Administered 2022-12-28 (×2): 2 mg via INTRAVENOUS
  Filled 2022-12-27 (×2): qty 1

## 2022-12-27 MED ORDER — IOHEXOL 350 MG/ML SOLN
75.0000 mL | Freq: Once | INTRAVENOUS | Status: AC | PRN
  Administered 2022-12-27: 75 mL via INTRAVENOUS

## 2022-12-27 MED ORDER — ONDANSETRON HCL 4 MG/2ML IJ SOLN
4.0000 mg | Freq: Four times a day (QID) | INTRAMUSCULAR | Status: DC | PRN
  Administered 2022-12-28: 4 mg via INTRAVENOUS
  Filled 2022-12-27: qty 2

## 2022-12-27 MED ORDER — SODIUM CHLORIDE 0.9 % IV BOLUS
1000.0000 mL | Freq: Once | INTRAVENOUS | Status: AC
  Administered 2022-12-27: 1000 mL via INTRAVENOUS

## 2022-12-27 MED ORDER — ACETAMINOPHEN 325 MG PO TABS: 650.0000 mg | ORAL_TABLET | ORAL | Status: DC | PRN

## 2022-12-27 MED ORDER — TETANUS-DIPHTH-ACELL PERTUSSIS 5-2.5-18.5 LF-MCG/0.5 IM SUSY
0.5000 mL | PREFILLED_SYRINGE | Freq: Once | INTRAMUSCULAR | Status: AC
  Administered 2022-12-27: 0.5 mL via INTRAMUSCULAR

## 2022-12-27 MED ORDER — FENTANYL CITRATE PF 50 MCG/ML IJ SOSY
50.0000 ug | PREFILLED_SYRINGE | Freq: Once | INTRAMUSCULAR | Status: AC | PRN
  Administered 2022-12-27: 50 ug via INTRAVENOUS
  Filled 2022-12-27: qty 1

## 2022-12-27 MED ORDER — ONDANSETRON 4 MG PO TBDP
4.0000 mg | ORAL_TABLET | Freq: Four times a day (QID) | ORAL | Status: DC | PRN
  Administered 2022-12-28: 4 mg via ORAL
  Filled 2022-12-27: qty 1

## 2022-12-27 MED ORDER — METOPROLOL TARTRATE 5 MG/5ML IV SOLN: 5.0000 mg | Freq: Four times a day (QID) | INTRAVENOUS | Status: DC | PRN

## 2022-12-27 MED ORDER — LIDOCAINE HCL (PF) 1 % IJ SOLN
INTRAMUSCULAR | Status: AC
  Administered 2022-12-27: 10 mL via INTRADERMAL
  Filled 2022-12-27: qty 10

## 2022-12-27 MED ORDER — OXYCODONE HCL 5 MG PO TABS
5.0000 mg | ORAL_TABLET | ORAL | Status: DC | PRN
  Administered 2022-12-28 (×2): 5 mg via ORAL
  Filled 2022-12-27 (×2): qty 1

## 2022-12-27 MED ORDER — DOUBLE ANTIBIOTIC 500-10000 UNIT/GM EX OINT
TOPICAL_OINTMENT | Freq: Two times a day (BID) | CUTANEOUS | Status: DC
  Filled 2022-12-27: qty 28.4

## 2022-12-27 MED ORDER — HYDROMORPHONE HCL 1 MG/ML IJ SOLN
0.5000 mg | Freq: Once | INTRAMUSCULAR | Status: AC
  Administered 2022-12-27: 0.5 mg via INTRAVENOUS
  Filled 2022-12-27: qty 1

## 2022-12-27 MED ORDER — ENOXAPARIN SODIUM 30 MG/0.3ML IJ SOSY
30.0000 mg | PREFILLED_SYRINGE | Freq: Two times a day (BID) | INTRAMUSCULAR | Status: DC
  Administered 2022-12-28: 30 mg via SUBCUTANEOUS
  Filled 2022-12-27: qty 0.3
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
## 2022-12-27 NOTE — ED Notes (Signed)
Trauma start
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
## 2022-12-27 NOTE — Progress Notes (Signed)
   12/27/22 1500  Spiritual Encounters  Type of Visit Initial  Care provided to: Patient  Conversation partners present during encounter Nurse  Referral source Trauma page  Reason for visit Trauma  OnCall Visit Yes   Ch responded to trauma page. No family present. No follow-up needed at this time.

## 2022-12-27 NOTE — ED Notes (Signed)
Patient ambulated to the bathroom with assistance.

## 2022-12-27 NOTE — ED Notes (Signed)
Pt walked to bathroom and when pt got back  into room, pt had  anear syncoapl event. HR dropped into 40's and BP was 80/40's. MD notified.
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
## 2022-12-27 NOTE — ED Notes (Signed)
C collar removed

## 2022-12-28 ENCOUNTER — Other Ambulatory Visit (HOSPITAL_COMMUNITY): Payer: Self-pay

## 2022-12-28 DIAGNOSIS — S2231XA Fracture of one rib, right side, initial encounter for closed fracture: Secondary | ICD-10-CM | POA: Diagnosis not present

## 2022-12-28 DIAGNOSIS — S0181XA Laceration without foreign body of other part of head, initial encounter: Secondary | ICD-10-CM | POA: Diagnosis not present

## 2022-12-28 DIAGNOSIS — S6292XA Unspecified fracture of left wrist and hand, initial encounter for closed fracture: Secondary | ICD-10-CM | POA: Diagnosis not present

## 2022-12-28 LAB — CBC
HCT: 36.1 % (ref 36.0–46.0)
Hemoglobin: 12.6 g/dL (ref 12.0–15.0)
MCH: 32.6 pg (ref 26.0–34.0)
MCHC: 34.9 g/dL (ref 30.0–36.0)
MCV: 93.3 fL (ref 80.0–100.0)
Platelets: 195 10*3/uL (ref 150–400)
RBC: 3.87 MIL/uL (ref 3.87–5.11)
RDW: 13 % (ref 11.5–15.5)
WBC: 8.3 10*3/uL (ref 4.0–10.5)
nRBC: 0 % (ref 0.0–0.2)

## 2022-12-28 LAB — HIV ANTIBODY (ROUTINE TESTING W REFLEX): HIV Screen 4th Generation wRfx: NONREACTIVE

## 2022-12-28 LAB — PROTIME-INR
INR: 1.1 (ref 0.8–1.2)
Prothrombin Time: 13.6 seconds (ref 11.4–15.2)

## 2022-12-28 LAB — CREATININE, SERUM
Creatinine, Ser: 0.8 mg/dL (ref 0.44–1.00)
GFR, Estimated: >60 mL/min (ref >60)

## 2022-12-28 MED ORDER — LIDOCAINE 5 % EX PTCH
1.0000 | MEDICATED_PATCH | CUTANEOUS | Status: DC
  Administered 2022-12-28: 1 via TRANSDERMAL
  Filled 2022-12-28: qty 1

## 2022-12-28 MED ORDER — ACETAMINOPHEN 325 MG PO TABS
650.0000 mg | ORAL_TABLET | Freq: Four times a day (QID) | ORAL | Status: DC
  Administered 2022-12-28: 650 mg via ORAL
  Filled 2022-12-28: qty 2

## 2022-12-28 MED ORDER — OXYCODONE HCL 5 MG PO TABS
5.0000 mg | ORAL_TABLET | Freq: Four times a day (QID) | ORAL | 0 refills | Status: DC | PRN
  Filled 2022-12-28: qty 20, 5d supply, fill #0

## 2022-12-28 MED ORDER — METHOCARBAMOL 500 MG PO TABS
500.0000 mg | ORAL_TABLET | Freq: Three times a day (TID) | ORAL | Status: DC
  Administered 2022-12-28: 500 mg via ORAL
  Filled 2022-12-28: qty 1

## 2022-12-28 MED ORDER — ACETAMINOPHEN 325 MG PO TABS: 650.0000 mg | ORAL_TABLET | Freq: Four times a day (QID) | ORAL | Status: AC | PRN

## 2022-12-28 MED ORDER — LIDOCAINE 5 % EX PTCH
1.0000 | MEDICATED_PATCH | CUTANEOUS | 0 refills | Status: DC
  Filled 2022-12-28: qty 60, 60d supply, fill #0

## 2022-12-28 MED ORDER — SILVER SULFADIAZINE 1 % EX CREA
1.0000 | TOPICAL_CREAM | Freq: Every day | CUTANEOUS | 0 refills | Status: DC
  Filled 2022-12-28 (×2): qty 50, 50d supply, fill #0

## 2022-12-28 MED ORDER — DOUBLE ANTIBIOTIC 500-10000 UNIT/GM EX OINT: 1.0000 | TOPICAL_OINTMENT | Freq: Two times a day (BID) | CUTANEOUS | Status: DC

## 2022-12-28 MED ORDER — OXYCODONE HCL 5 MG PO TABS: 5.0000 mg | ORAL_TABLET | ORAL | Status: DC | PRN

## 2022-12-28 MED ORDER — SODIUM CHLORIDE 0.9 % IV BOLUS
500.0000 mL | Freq: Once | INTRAVENOUS | Status: AC
  Administered 2022-12-28: 500 mL via INTRAVENOUS

## 2022-12-28 MED ORDER — IBUPROFEN 200 MG PO TABS
600.0000 mg | ORAL_TABLET | Freq: Three times a day (TID) | ORAL | Status: DC
  Administered 2022-12-28 (×2): 600 mg via ORAL
  Filled 2022-12-28 (×2): qty 3

## 2022-12-28 MED ORDER — METHOCARBAMOL 500 MG PO TABS
500.0000 mg | ORAL_TABLET | Freq: Three times a day (TID) | ORAL | 0 refills | Status: DC | PRN
  Filled 2022-12-28: qty 30, 10d supply, fill #0

## 2022-12-28 NOTE — Plan of Care (Signed)
  Problem: Education: Goal: Knowledge of General Education information will improve Description: Including pain rating scale, medication(s)/side effects and non-pharmacologic comfort measures Outcome: Adequate for Discharge   Problem: Health Behavior/Discharge Planning: Goal: Ability to manage health-related needs will improve Outcome: Adequate for Discharge   Problem: Clinical Measurements: Goal: Ability to maintain clinical measurements within normal limits will improve Outcome: Adequate for Discharge Goal: Will remain free from infection Outcome: Adequate for Discharge Goal: Diagnostic test results will improve Outcome: Adequate for Discharge Goal: Respiratory complications will improve Outcome: Adequate for Discharge Goal: Cardiovascular complication will be avoided Outcome: Adequate for Discharge   Problem: Activity: Goal: Risk for activity intolerance will decrease Outcome: Adequate for Discharge   Problem: Nutrition: Goal: Adequate nutrition will be maintained Outcome: Adequate for Discharge   Problem: Coping: Goal: Level of anxiety will decrease Outcome: Adequate for Discharge   Problem: Elimination: Goal: Will not experience complications related to bowel motility Outcome: Adequate for Discharge Goal: Will not experience complications related to urinary retention Outcome: Adequate for Discharge   Problem: Pain Managment: Goal: General experience of comfort will improve Outcome: Adequate for Discharge   Problem: Safety: Goal: Ability to remain free from injury will improve Outcome: Adequate for Discharge   Problem: Skin Integrity: Goal: Risk for impaired skin integrity will decrease Outcome: Adequate for Discharge   Problem: Acute Rehab OT Goals (only OT should resolve) Goal: Pt/Caregiver Will Perform Home Exercise Program Outcome: Adequate for Discharge Goal: OT Additional ADL Goal #1 Outcome: Adequate for Discharge
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
## 2022-12-28 NOTE — Progress Notes (Addendum)
Orthopedic Tech Progress Note Patient Details:  Tina Olsen 1961/01/07 [id number]  Ortho Devices Type of Ortho Device: Thumb spica splint Splint Material: Fiberglass Ortho Device/Splint Location: lue Ortho Device/Splint Interventions: Ordered, Application, Adjustment  I applied splint post reduction by dr at drs request. Request for splint is in drs note. Post Interventions Patient Tolerated: Well Instructions Provided: Care of device, Adjustment of device  Karolee Stamps 12/28/2022, 4:38 AM

## 2022-12-28 NOTE — Progress Notes (Signed)
Explained discharge instructions to patient. Reviewed follow up appointment and next medication administration times. Also reviewed education. Patient verbalized having an understanding for instructions given. All belongings are in the patient's possession to include TOC meds. IV removed by unit staff prior to my discharging. Marland Kitchen CCMD was notified. No other needs verbalized. Transported downstairs for discharge.

## 2022-12-28 NOTE — Progress Notes (Signed)
Mobility Specialist - Progress Note   12/28/22 1452  Mobility  Activity Ambulated with assistance to bathroom  Level of Assistance Contact guard assist, steadying assist  Assistive Device Other (Comment) (HHA)  Distance Ambulated (ft) 20 ft  Activity Response Tolerated well  $Mobility charge 1 Mobility   Pt received in bed requesting assistance to BR. No complaints throughout. Pt Indep with pericare.Pt returned to bed with all needs met.  Franki Monte  Mobility Specialist Please contact via Solicitor or Rehab office at [phone]
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
## 2022-12-30 DIAGNOSIS — S62213A Bennett's fracture, unspecified hand, initial encounter for closed fracture: Secondary | ICD-10-CM | POA: Insufficient documentation

## 2022-12-30 DIAGNOSIS — S62212A Bennett's fracture, left hand, initial encounter for closed fracture: Secondary | ICD-10-CM | POA: Diagnosis not present

## 2022-12-30 DIAGNOSIS — M79645 Pain in left finger(s): Secondary | ICD-10-CM | POA: Diagnosis not present

## 2022-12-30 DIAGNOSIS — M79642 Pain in left hand: Secondary | ICD-10-CM | POA: Insufficient documentation

## 2022-12-31 ENCOUNTER — Ambulatory Visit: Payer: BC Managed Care – PPO | Admitting: Family Medicine

## 2022-12-31 VITALS — BP 110/74 | HR 63 | Ht 62.0 in | Wt 126.0 lb

## 2022-12-31 DIAGNOSIS — S060X0A Concussion without loss of consciousness, initial encounter: Secondary | ICD-10-CM | POA: Diagnosis not present

## 2022-12-31 DIAGNOSIS — S2241XA Multiple fractures of ribs, right side, initial encounter for closed fracture: Secondary | ICD-10-CM

## 2022-12-31 DIAGNOSIS — M542 Cervicalgia: Secondary | ICD-10-CM

## 2022-12-31 DIAGNOSIS — M069 Rheumatoid arthritis, unspecified: Secondary | ICD-10-CM

## 2022-12-31 MED ORDER — MUPIROCIN 2 % EX OINT: TOPICAL_OINTMENT | CUTANEOUS | 3 refills | Status: DC

## 2022-12-31 NOTE — Patient Instructions (Addendum)
Thank you for coming in today.   I've referred you to Physical Therapy.  Let us know if you don't hear from them in one week.   Meclizine over the counter could help if you are very dizzy and nauseated.   Recheck in about 4 weeks.   Let me know sooner if this is not doing well.
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
## 2023-01-01 DIAGNOSIS — X58XXXA Exposure to other specified factors, initial encounter: Secondary | ICD-10-CM | POA: Diagnosis not present

## 2023-01-01 DIAGNOSIS — Y999 Unspecified external cause status: Secondary | ICD-10-CM | POA: Diagnosis not present

## 2023-01-01 DIAGNOSIS — F4310 Post-traumatic stress disorder, unspecified: Secondary | ICD-10-CM | POA: Diagnosis not present

## 2023-01-01 DIAGNOSIS — S62212A Bennett's fracture, left hand, initial encounter for closed fracture: Secondary | ICD-10-CM | POA: Diagnosis not present

## 2023-01-01 DIAGNOSIS — F4323 Adjustment disorder with mixed anxiety and depressed mood: Secondary | ICD-10-CM | POA: Diagnosis not present

## 2023-01-05 ENCOUNTER — Telehealth: Payer: Self-pay | Admitting: Family Medicine

## 2023-01-05 NOTE — Telephone Encounter (Signed)
Pt would like Physical Therapy changed to [facility] in [place]. (Not [facility])  She is having trouble driving and [facility] was recommended to her.

## 2023-01-05 NOTE — Telephone Encounter (Signed)
Referral changed and faxed.

## 2023-01-11 DIAGNOSIS — L905 Scar conditions and fibrosis of skin: Secondary | ICD-10-CM | POA: Diagnosis not present

## 2023-01-12 DIAGNOSIS — F4323 Adjustment disorder with mixed anxiety and depressed mood: Secondary | ICD-10-CM | POA: Diagnosis not present

## 2023-01-12 DIAGNOSIS — F4310 Post-traumatic stress disorder, unspecified: Secondary | ICD-10-CM | POA: Diagnosis not present

## 2023-01-13 DIAGNOSIS — F5222 Female sexual arousal disorder: Secondary | ICD-10-CM | POA: Diagnosis not present

## 2023-01-13 DIAGNOSIS — E538 Deficiency of other specified B group vitamins: Secondary | ICD-10-CM | POA: Diagnosis not present

## 2023-01-14 DIAGNOSIS — S2241XD Multiple fractures of ribs, right side, subsequent encounter for fracture with routine healing: Secondary | ICD-10-CM | POA: Diagnosis not present

## 2023-01-14 DIAGNOSIS — M542 Cervicalgia: Secondary | ICD-10-CM | POA: Diagnosis not present

## 2023-01-14 DIAGNOSIS — S62212D Bennett's fracture, left hand, subsequent encounter for fracture with routine healing: Secondary | ICD-10-CM | POA: Diagnosis not present

## 2023-01-19 DIAGNOSIS — M1991 Primary osteoarthritis, unspecified site: Secondary | ICD-10-CM | POA: Diagnosis not present

## 2023-01-19 DIAGNOSIS — M0579 Rheumatoid arthritis with rheumatoid factor of multiple sites without organ or systems involvement: Secondary | ICD-10-CM | POA: Diagnosis not present

## 2023-01-19 DIAGNOSIS — Z79899 Other long term (current) drug therapy: Secondary | ICD-10-CM | POA: Diagnosis not present

## 2023-01-20 DIAGNOSIS — M542 Cervicalgia: Secondary | ICD-10-CM | POA: Diagnosis not present

## 2023-01-20 DIAGNOSIS — S2241XD Multiple fractures of ribs, right side, subsequent encounter for fracture with routine healing: Secondary | ICD-10-CM | POA: Diagnosis not present

## 2023-01-25 DIAGNOSIS — M542 Cervicalgia: Secondary | ICD-10-CM | POA: Diagnosis not present

## 2023-01-25 DIAGNOSIS — S2241XD Multiple fractures of ribs, right side, subsequent encounter for fracture with routine healing: Secondary | ICD-10-CM | POA: Diagnosis not present

## 2023-01-25 DIAGNOSIS — S62212A Bennett's fracture, left hand, initial encounter for closed fracture: Secondary | ICD-10-CM | POA: Diagnosis not present

## 2023-01-26 DIAGNOSIS — F4323 Adjustment disorder with mixed anxiety and depressed mood: Secondary | ICD-10-CM | POA: Diagnosis not present

## 2023-01-26 DIAGNOSIS — F4310 Post-traumatic stress disorder, unspecified: Secondary | ICD-10-CM | POA: Diagnosis not present

## 2023-01-27 DIAGNOSIS — M79644 Pain in right finger(s): Secondary | ICD-10-CM | POA: Diagnosis not present

## 2023-01-27 DIAGNOSIS — S62212A Bennett's fracture, left hand, initial encounter for closed fracture: Secondary | ICD-10-CM | POA: Diagnosis not present
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
## 2023-01-28 ENCOUNTER — Ambulatory Visit: Payer: BC Managed Care – PPO | Admitting: Family Medicine

## 2023-01-28 ENCOUNTER — Encounter: Payer: Self-pay | Admitting: Family Medicine

## 2023-01-28 VITALS — BP 100/70 | HR 58 | Ht 62.0 in | Wt 132.4 lb

## 2023-01-28 DIAGNOSIS — S060XAA Concussion with loss of consciousness status unknown, initial encounter: Secondary | ICD-10-CM | POA: Insufficient documentation

## 2023-01-28 DIAGNOSIS — S060X0D Concussion without loss of consciousness, subsequent encounter: Secondary | ICD-10-CM | POA: Diagnosis not present

## 2023-01-28 DIAGNOSIS — S2241XD Multiple fractures of ribs, right side, subsequent encounter for fracture with routine healing: Secondary | ICD-10-CM | POA: Diagnosis not present

## 2023-01-28 DIAGNOSIS — M542 Cervicalgia: Secondary | ICD-10-CM | POA: Diagnosis not present

## 2023-01-28 DIAGNOSIS — S63641A Sprain of metacarpophalangeal joint of right thumb, initial encounter: Secondary | ICD-10-CM

## 2023-01-28 NOTE — Patient Instructions (Signed)
Thank you for coming in today.   TeePee thumb brace if needed.   Consider medicine in the future.   Lets recheck in another month.   Let me know before then if you are not doing well.

## 2023-02-01 DIAGNOSIS — S2241XD Multiple fractures of ribs, right side, subsequent encounter for fracture with routine healing: Secondary | ICD-10-CM | POA: Diagnosis not present

## 2023-02-01 DIAGNOSIS — M542 Cervicalgia: Secondary | ICD-10-CM | POA: Diagnosis not present

## 2023-02-02 ENCOUNTER — Other Ambulatory Visit: Payer: Self-pay | Admitting: Endocrinology

## 2023-02-02 DIAGNOSIS — M8580 Other specified disorders of bone density and structure, unspecified site: Secondary | ICD-10-CM

## 2023-02-03 DIAGNOSIS — M542 Cervicalgia: Secondary | ICD-10-CM | POA: Diagnosis not present

## 2023-02-03 DIAGNOSIS — M4316 Spondylolisthesis, lumbar region: Secondary | ICD-10-CM | POA: Diagnosis not present

## 2023-02-03 DIAGNOSIS — S2241XD Multiple fractures of ribs, right side, subsequent encounter for fracture with routine healing: Secondary | ICD-10-CM | POA: Diagnosis not present

## 2023-02-04 DIAGNOSIS — M79644 Pain in right finger(s): Secondary | ICD-10-CM | POA: Diagnosis not present

## 2023-02-04 DIAGNOSIS — S62212A Bennett's fracture, left hand, initial encounter for closed fracture: Secondary | ICD-10-CM | POA: Diagnosis not present

## 2023-02-04 DIAGNOSIS — M79641 Pain in right hand: Secondary | ICD-10-CM | POA: Insufficient documentation

## 2023-02-09 DIAGNOSIS — S2241XD Multiple fractures of ribs, right side, subsequent encounter for fracture with routine healing: Secondary | ICD-10-CM | POA: Diagnosis not present

## 2023-02-09 DIAGNOSIS — M542 Cervicalgia: Secondary | ICD-10-CM | POA: Diagnosis not present

## 2023-02-11 DIAGNOSIS — S2241XD Multiple fractures of ribs, right side, subsequent encounter for fracture with routine healing: Secondary | ICD-10-CM | POA: Diagnosis not present

## 2023-02-11 DIAGNOSIS — M542 Cervicalgia: Secondary | ICD-10-CM | POA: Diagnosis not present

## 2023-02-12 DIAGNOSIS — Z472 Encounter for removal of internal fixation device: Secondary | ICD-10-CM | POA: Diagnosis not present

## 2023-02-15 DIAGNOSIS — F5222 Female sexual arousal disorder: Secondary | ICD-10-CM | POA: Diagnosis not present

## 2023-02-15 DIAGNOSIS — M79645 Pain in left finger(s): Secondary | ICD-10-CM | POA: Diagnosis not present

## 2023-02-15 DIAGNOSIS — M542 Cervicalgia: Secondary | ICD-10-CM | POA: Diagnosis not present

## 2023-02-15 DIAGNOSIS — S2241XD Multiple fractures of ribs, right side, subsequent encounter for fracture with routine healing: Secondary | ICD-10-CM | POA: Diagnosis not present

## 2023-02-15 DIAGNOSIS — E538 Deficiency of other specified B group vitamins: Secondary | ICD-10-CM | POA: Diagnosis not present

## 2023-02-18 DIAGNOSIS — M542 Cervicalgia: Secondary | ICD-10-CM | POA: Diagnosis not present

## 2023-02-18 DIAGNOSIS — S2241XD Multiple fractures of ribs, right side, subsequent encounter for fracture with routine healing: Secondary | ICD-10-CM | POA: Diagnosis not present

## 2023-02-22 DIAGNOSIS — M542 Cervicalgia: Secondary | ICD-10-CM | POA: Diagnosis not present

## 2023-02-22 DIAGNOSIS — L820 Inflamed seborrheic keratosis: Secondary | ICD-10-CM | POA: Diagnosis not present

## 2023-02-22 DIAGNOSIS — L578 Other skin changes due to chronic exposure to nonionizing radiation: Secondary | ICD-10-CM | POA: Diagnosis not present

## 2023-02-22 DIAGNOSIS — L814 Other melanin hyperpigmentation: Secondary | ICD-10-CM | POA: Diagnosis not present

## 2023-02-22 DIAGNOSIS — M79645 Pain in left finger(s): Secondary | ICD-10-CM | POA: Diagnosis not present

## 2023-02-22 DIAGNOSIS — Z85828 Personal history of other malignant neoplasm of skin: Secondary | ICD-10-CM | POA: Diagnosis not present

## 2023-02-22 DIAGNOSIS — L821 Other seborrheic keratosis: Secondary | ICD-10-CM | POA: Diagnosis not present

## 2023-02-22 DIAGNOSIS — S2241XD Multiple fractures of ribs, right side, subsequent encounter for fracture with routine healing: Secondary | ICD-10-CM | POA: Diagnosis not present

## 2023-02-22 DIAGNOSIS — L570 Actinic keratosis: Secondary | ICD-10-CM | POA: Diagnosis not present

## 2023-02-23 DIAGNOSIS — H40013 Open angle with borderline findings, low risk, bilateral: Secondary | ICD-10-CM | POA: Diagnosis not present

## 2023-02-23 DIAGNOSIS — H25813 Combined forms of age-related cataract, bilateral: Secondary | ICD-10-CM | POA: Diagnosis not present

## 2023-02-23 DIAGNOSIS — H40033 Anatomical narrow angle, bilateral: Secondary | ICD-10-CM | POA: Diagnosis not present

## 2023-02-23 DIAGNOSIS — H43812 Vitreous degeneration, left eye: Secondary | ICD-10-CM | POA: Diagnosis not present

## 2023-02-23 DIAGNOSIS — H524 Presbyopia: Secondary | ICD-10-CM | POA: Diagnosis not present

## 2023-02-24 DIAGNOSIS — S2241XD Multiple fractures of ribs, right side, subsequent encounter for fracture with routine healing: Secondary | ICD-10-CM | POA: Diagnosis not present

## 2023-02-24 DIAGNOSIS — S62212D Bennett's fracture, left hand, subsequent encounter for fracture with routine healing: Secondary | ICD-10-CM | POA: Diagnosis not present

## 2023-02-24 DIAGNOSIS — M542 Cervicalgia: Secondary | ICD-10-CM | POA: Diagnosis not present
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
## 2023-03-01 ENCOUNTER — Encounter: Payer: BC Managed Care – PPO | Admitting: Family Medicine

## 2023-03-02 DIAGNOSIS — M948X4 Other specified disorders of cartilage, hand: Secondary | ICD-10-CM | POA: Diagnosis not present

## 2023-03-02 DIAGNOSIS — G8918 Other acute postprocedural pain: Secondary | ICD-10-CM | POA: Insufficient documentation

## 2023-03-02 DIAGNOSIS — Y999 Unspecified external cause status: Secondary | ICD-10-CM | POA: Diagnosis not present

## 2023-03-02 DIAGNOSIS — S63045A Dislocation of carpometacarpal joint of left thumb, initial encounter: Secondary | ICD-10-CM | POA: Diagnosis not present

## 2023-03-02 DIAGNOSIS — S62212A Bennett's fracture, left hand, initial encounter for closed fracture: Secondary | ICD-10-CM | POA: Diagnosis not present

## 2023-03-02 DIAGNOSIS — M25342 Other instability, left hand: Secondary | ICD-10-CM | POA: Diagnosis not present

## 2023-03-02 DIAGNOSIS — X58XXXA Exposure to other specified factors, initial encounter: Secondary | ICD-10-CM | POA: Diagnosis not present

## 2023-03-04 DIAGNOSIS — F4310 Post-traumatic stress disorder, unspecified: Secondary | ICD-10-CM | POA: Diagnosis not present

## 2023-03-04 DIAGNOSIS — F4323 Adjustment disorder with mixed anxiety and depressed mood: Secondary | ICD-10-CM | POA: Diagnosis not present
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
## 2023-03-08 ENCOUNTER — Encounter: Payer: Self-pay | Admitting: Family Medicine

## 2023-03-08 ENCOUNTER — Ambulatory Visit (INDEPENDENT_AMBULATORY_CARE_PROVIDER_SITE_OTHER): Payer: BC Managed Care – PPO | Admitting: Family Medicine

## 2023-03-08 VITALS — BP 106/76 | HR 68 | Ht 62.0 in | Wt 132.4 lb

## 2023-03-08 DIAGNOSIS — S060X0D Concussion without loss of consciousness, subsequent encounter: Secondary | ICD-10-CM | POA: Diagnosis not present

## 2023-03-08 NOTE — Patient Instructions (Signed)
Thank you for coming in today.   Recheck when you return from [place].   Let me know if you need anything.

## 2023-03-15 DIAGNOSIS — M1812 Unilateral primary osteoarthritis of first carpometacarpal joint, left hand: Secondary | ICD-10-CM | POA: Diagnosis not present

## 2023-03-15 DIAGNOSIS — S2241XD Multiple fractures of ribs, right side, subsequent encounter for fracture with routine healing: Secondary | ICD-10-CM | POA: Diagnosis not present

## 2023-03-15 DIAGNOSIS — M542 Cervicalgia: Secondary | ICD-10-CM | POA: Diagnosis not present

## 2023-03-15 DIAGNOSIS — M79645 Pain in left finger(s): Secondary | ICD-10-CM | POA: Diagnosis not present

## 2023-03-16 ENCOUNTER — Ambulatory Visit
Admission: RE | Admit: 2023-03-16 | Discharge: 2023-03-16 | Disposition: A | Discharge status: Home / Self Care | Payer: BC Managed Care – PPO | Source: Ambulatory Visit | Attending: Endocrinology | Admitting: Endocrinology

## 2023-03-16 DIAGNOSIS — M8580 Other specified disorders of bone density and structure, unspecified site: Secondary | ICD-10-CM | POA: Insufficient documentation

## 2023-03-16 DIAGNOSIS — E538 Deficiency of other specified B group vitamins: Secondary | ICD-10-CM | POA: Diagnosis not present

## 2023-03-16 DIAGNOSIS — Z780 Asymptomatic menopausal state: Secondary | ICD-10-CM | POA: Diagnosis not present

## 2023-03-16 DIAGNOSIS — M85852 Other specified disorders of bone density and structure, left thigh: Secondary | ICD-10-CM | POA: Diagnosis not present

## 2023-03-16 DIAGNOSIS — F5222 Female sexual arousal disorder: Secondary | ICD-10-CM | POA: Diagnosis not present

## 2023-03-18 DIAGNOSIS — M8580 Other specified disorders of bone density and structure, unspecified site: Secondary | ICD-10-CM | POA: Diagnosis not present

## 2023-03-18 DIAGNOSIS — N959 Unspecified menopausal and perimenopausal disorder: Secondary | ICD-10-CM | POA: Diagnosis not present

## 2023-03-18 DIAGNOSIS — E039 Hypothyroidism, unspecified: Secondary | ICD-10-CM | POA: Diagnosis not present

## 2023-03-19 DIAGNOSIS — H43812 Vitreous degeneration, left eye: Secondary | ICD-10-CM | POA: Diagnosis not present

## 2023-03-22 DIAGNOSIS — S2241XD Multiple fractures of ribs, right side, subsequent encounter for fracture with routine healing: Secondary | ICD-10-CM | POA: Diagnosis not present

## 2023-03-22 DIAGNOSIS — M542 Cervicalgia: Secondary | ICD-10-CM | POA: Diagnosis not present

## 2023-03-22 DIAGNOSIS — M25532 Pain in left wrist: Secondary | ICD-10-CM | POA: Diagnosis not present

## 2023-05-10 DIAGNOSIS — F5222 Female sexual arousal disorder: Secondary | ICD-10-CM | POA: Diagnosis not present

## 2023-05-10 DIAGNOSIS — E538 Deficiency of other specified B group vitamins: Secondary | ICD-10-CM | POA: Diagnosis not present

## 2023-05-10 DIAGNOSIS — M25532 Pain in left wrist: Secondary | ICD-10-CM | POA: Diagnosis not present

## 2023-05-17 ENCOUNTER — Encounter: Payer: BC Managed Care – PPO | Admitting: Family Medicine

## 2023-05-17 DIAGNOSIS — M79645 Pain in left finger(s): Secondary | ICD-10-CM | POA: Diagnosis not present

## 2023-05-17 DIAGNOSIS — M25532 Pain in left wrist: Secondary | ICD-10-CM | POA: Diagnosis not present
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
## 2023-05-19 ENCOUNTER — Ambulatory Visit (INDEPENDENT_AMBULATORY_CARE_PROVIDER_SITE_OTHER): Payer: BC Managed Care – PPO | Admitting: Family Medicine

## 2023-05-19 VITALS — BP 96/62 | HR 55 | Ht 62.0 in | Wt 132.0 lb

## 2023-05-19 DIAGNOSIS — S63641D Sprain of metacarpophalangeal joint of right thumb, subsequent encounter: Secondary | ICD-10-CM | POA: Diagnosis not present

## 2023-05-19 DIAGNOSIS — M8580 Other specified disorders of bone density and structure, unspecified site: Secondary | ICD-10-CM | POA: Diagnosis not present

## 2023-05-19 DIAGNOSIS — S060X0D Concussion without loss of consciousness, subsequent encounter: Secondary | ICD-10-CM | POA: Diagnosis not present

## 2023-05-19 NOTE — Patient Instructions (Addendum)
Thank you for coming in today.   Ok to do some limited riding if you feel ok and feel like you have control over the bike.   Consider brake extensions.   If you need to transition to a flat handlebar.   Recheck with me as needed.

## 2023-05-20 DIAGNOSIS — H04123 Dry eye syndrome of bilateral lacrimal glands: Secondary | ICD-10-CM | POA: Diagnosis not present

## 2023-05-20 DIAGNOSIS — H43812 Vitreous degeneration, left eye: Secondary | ICD-10-CM | POA: Diagnosis not present

## 2023-05-28 DIAGNOSIS — M79645 Pain in left finger(s): Secondary | ICD-10-CM | POA: Diagnosis not present

## 2023-05-28 DIAGNOSIS — M25532 Pain in left wrist: Secondary | ICD-10-CM | POA: Diagnosis not present

## 2023-05-31 DIAGNOSIS — M79645 Pain in left finger(s): Secondary | ICD-10-CM | POA: Diagnosis not present

## 2023-06-09 DIAGNOSIS — M79645 Pain in left finger(s): Secondary | ICD-10-CM | POA: Diagnosis not present

## 2023-06-09 DIAGNOSIS — E538 Deficiency of other specified B group vitamins: Secondary | ICD-10-CM | POA: Diagnosis not present

## 2023-06-09 DIAGNOSIS — F5222 Female sexual arousal disorder: Secondary | ICD-10-CM | POA: Diagnosis not present

## 2023-06-14 DIAGNOSIS — M79644 Pain in right finger(s): Secondary | ICD-10-CM | POA: Diagnosis not present

## 2023-06-14 DIAGNOSIS — S62212A Bennett's fracture, left hand, initial encounter for closed fracture: Secondary | ICD-10-CM | POA: Diagnosis not present

## 2023-06-14 DIAGNOSIS — S63649A Sprain of metacarpophalangeal joint of unspecified thumb, initial encounter: Secondary | ICD-10-CM | POA: Insufficient documentation

## 2023-06-14 DIAGNOSIS — M25341 Other instability, right hand: Secondary | ICD-10-CM | POA: Diagnosis not present

## 2023-06-18 DIAGNOSIS — F4310 Post-traumatic stress disorder, unspecified: Secondary | ICD-10-CM | POA: Diagnosis not present

## 2023-06-18 DIAGNOSIS — F4323 Adjustment disorder with mixed anxiety and depressed mood: Secondary | ICD-10-CM | POA: Diagnosis not present

## 2023-06-21 DIAGNOSIS — M25532 Pain in left wrist: Secondary | ICD-10-CM | POA: Diagnosis not present

## 2023-06-22 DIAGNOSIS — M25341 Other instability, right hand: Secondary | ICD-10-CM | POA: Diagnosis not present

## 2023-06-22 DIAGNOSIS — M25841 Other specified joint disorders, right hand: Secondary | ICD-10-CM | POA: Diagnosis not present

## 2023-06-28 DIAGNOSIS — M25341 Other instability, right hand: Secondary | ICD-10-CM | POA: Diagnosis not present

## 2023-06-28 DIAGNOSIS — M25841 Other specified joint disorders, right hand: Secondary | ICD-10-CM | POA: Diagnosis not present

## 2023-06-29 DIAGNOSIS — Z1331 Encounter for screening for depression: Secondary | ICD-10-CM | POA: Diagnosis not present

## 2023-06-29 DIAGNOSIS — F5222 Female sexual arousal disorder: Secondary | ICD-10-CM | POA: Diagnosis not present

## 2023-06-29 DIAGNOSIS — R87622 Low grade squamous intraepithelial lesion on cytologic smear of vagina (LGSIL): Secondary | ICD-10-CM | POA: Diagnosis not present

## 2023-06-29 DIAGNOSIS — E538 Deficiency of other specified B group vitamins: Secondary | ICD-10-CM | POA: Diagnosis not present

## 2023-06-29 DIAGNOSIS — Z1272 Encounter for screening for malignant neoplasm of vagina: Secondary | ICD-10-CM | POA: Diagnosis not present

## 2023-06-29 DIAGNOSIS — Z01411 Encounter for gynecological examination (general) (routine) with abnormal findings: Secondary | ICD-10-CM | POA: Diagnosis not present

## 2023-06-29 LAB — RESULTS CONSOLE HPV: CHL HPV: NEGATIVE

## 2023-06-29 LAB — HM PAP SMEAR: HM Pap smear: ABNORMAL

## 2023-06-30 DIAGNOSIS — F4310 Post-traumatic stress disorder, unspecified: Secondary | ICD-10-CM | POA: Diagnosis not present

## 2023-06-30 DIAGNOSIS — F4323 Adjustment disorder with mixed anxiety and depressed mood: Secondary | ICD-10-CM | POA: Diagnosis not present

## 2023-07-06 DIAGNOSIS — Z0189 Encounter for other specified special examinations: Secondary | ICD-10-CM | POA: Diagnosis not present

## 2023-07-06 DIAGNOSIS — F5222 Female sexual arousal disorder: Secondary | ICD-10-CM | POA: Diagnosis not present

## 2023-07-06 DIAGNOSIS — Z13220 Encounter for screening for lipoid disorders: Secondary | ICD-10-CM | POA: Diagnosis not present

## 2023-07-06 DIAGNOSIS — E538 Deficiency of other specified B group vitamins: Secondary | ICD-10-CM | POA: Diagnosis not present

## 2023-07-06 DIAGNOSIS — Z01411 Encounter for gynecological examination (general) (routine) with abnormal findings: Secondary | ICD-10-CM | POA: Diagnosis not present

## 2023-07-07 DIAGNOSIS — M25341 Other instability, right hand: Secondary | ICD-10-CM | POA: Diagnosis not present

## 2023-07-08 ENCOUNTER — Ambulatory Visit (HOSPITAL_BASED_OUTPATIENT_CLINIC_OR_DEPARTMENT_OTHER): Payer: BC Managed Care – PPO | Admitting: Family Medicine

## 2023-07-14 DIAGNOSIS — F4323 Adjustment disorder with mixed anxiety and depressed mood: Secondary | ICD-10-CM | POA: Diagnosis not present

## 2023-07-14 DIAGNOSIS — F4310 Post-traumatic stress disorder, unspecified: Secondary | ICD-10-CM | POA: Diagnosis not present

## 2023-07-19 DIAGNOSIS — Z79899 Other long term (current) drug therapy: Secondary | ICD-10-CM | POA: Diagnosis not present

## 2023-07-19 DIAGNOSIS — M79641 Pain in right hand: Secondary | ICD-10-CM | POA: Diagnosis not present

## 2023-07-19 DIAGNOSIS — M1991 Primary osteoarthritis, unspecified site: Secondary | ICD-10-CM | POA: Diagnosis not present

## 2023-07-19 DIAGNOSIS — M0579 Rheumatoid arthritis with rheumatoid factor of multiple sites without organ or systems involvement: Secondary | ICD-10-CM | POA: Diagnosis not present

## 2023-07-22 DIAGNOSIS — M79644 Pain in right finger(s): Secondary | ICD-10-CM | POA: Diagnosis not present

## 2023-07-27 ENCOUNTER — Encounter (HOSPITAL_BASED_OUTPATIENT_CLINIC_OR_DEPARTMENT_OTHER): Payer: Self-pay | Admitting: *Deleted

## 2023-07-27 ENCOUNTER — Encounter (HOSPITAL_BASED_OUTPATIENT_CLINIC_OR_DEPARTMENT_OTHER): Payer: Self-pay | Admitting: Family Medicine

## 2023-07-27 ENCOUNTER — Ambulatory Visit (HOSPITAL_BASED_OUTPATIENT_CLINIC_OR_DEPARTMENT_OTHER): Payer: BC Managed Care – PPO | Admitting: Family Medicine

## 2023-07-27 VITALS — BP 132/84 | HR 64 | Ht 62.0 in | Wt 133.8 lb

## 2023-07-27 DIAGNOSIS — Z0000 Encounter for general adult medical examination without abnormal findings: Secondary | ICD-10-CM

## 2023-07-27 DIAGNOSIS — Z23 Encounter for immunization: Secondary | ICD-10-CM | POA: Diagnosis not present

## 2023-07-27 DIAGNOSIS — E039 Hypothyroidism, unspecified: Secondary | ICD-10-CM | POA: Diagnosis not present

## 2023-07-27 DIAGNOSIS — M5030 Other cervical disc degeneration, unspecified cervical region: Secondary | ICD-10-CM | POA: Insufficient documentation

## 2023-07-27 DIAGNOSIS — M79644 Pain in right finger(s): Secondary | ICD-10-CM | POA: Diagnosis not present

## 2023-07-27 LAB — HM MAMMOGRAPHY

## 2023-07-27 NOTE — Assessment & Plan Note (Signed)
Diagnosed around 2017, currently utilizing levothyroxine, has been on stable dose of 75 mcg daily.  No concerns at this time, patient clinically euthyroid.  Can continue with current medication regimen
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
## 2023-08-03 DIAGNOSIS — M79644 Pain in right finger(s): Secondary | ICD-10-CM | POA: Diagnosis not present

## 2023-08-05 DIAGNOSIS — E538 Deficiency of other specified B group vitamins: Secondary | ICD-10-CM | POA: Diagnosis not present

## 2023-08-05 DIAGNOSIS — N890 Mild vaginal dysplasia: Secondary | ICD-10-CM | POA: Diagnosis not present

## 2023-08-05 DIAGNOSIS — F5222 Female sexual arousal disorder: Secondary | ICD-10-CM | POA: Diagnosis not present

## 2023-08-10 DIAGNOSIS — M79644 Pain in right finger(s): Secondary | ICD-10-CM | POA: Diagnosis not present

## 2023-08-23 DIAGNOSIS — E538 Deficiency of other specified B group vitamins: Secondary | ICD-10-CM | POA: Diagnosis not present

## 2023-08-24 DIAGNOSIS — M79644 Pain in right finger(s): Secondary | ICD-10-CM | POA: Diagnosis not present

## 2023-08-31 DIAGNOSIS — M79644 Pain in right finger(s): Secondary | ICD-10-CM | POA: Diagnosis not present

## 2023-09-02 DIAGNOSIS — F5222 Female sexual arousal disorder: Secondary | ICD-10-CM | POA: Diagnosis not present

## 2023-09-02 DIAGNOSIS — E538 Deficiency of other specified B group vitamins: Secondary | ICD-10-CM | POA: Diagnosis not present

## 2023-09-07 DIAGNOSIS — M79644 Pain in right finger(s): Secondary | ICD-10-CM | POA: Diagnosis not present

## 2023-09-14 ENCOUNTER — Telehealth: Payer: BC Managed Care – PPO | Admitting: Physician Assistant

## 2023-09-14 DIAGNOSIS — T3695XA Adverse effect of unspecified systemic antibiotic, initial encounter: Secondary | ICD-10-CM

## 2023-09-14 DIAGNOSIS — J329 Chronic sinusitis, unspecified: Secondary | ICD-10-CM | POA: Diagnosis not present

## 2023-09-14 DIAGNOSIS — B379 Candidiasis, unspecified: Secondary | ICD-10-CM | POA: Diagnosis not present

## 2023-09-14 DIAGNOSIS — J40 Bronchitis, not specified as acute or chronic: Secondary | ICD-10-CM | POA: Diagnosis not present

## 2023-09-14 MED ORDER — BENZONATATE 100 MG PO CAPS: 100.0000 mg | ORAL_CAPSULE | Freq: Three times a day (TID) | ORAL | 0 refills | Status: DC | PRN

## 2023-09-14 MED ORDER — PREDNISONE 20 MG PO TABS: 40.0000 mg | ORAL_TABLET | Freq: Every day | ORAL | 0 refills | Status: DC

## 2023-09-14 MED ORDER — AMOXICILLIN-POT CLAVULANATE 875-125 MG PO TABS: 1.0000 | ORAL_TABLET | Freq: Two times a day (BID) | ORAL | 0 refills | Status: DC

## 2023-09-14 MED ORDER — PROMETHAZINE-DM 6.25-15 MG/5ML PO SYRP: 5.0000 mL | ORAL_SOLUTION | Freq: Four times a day (QID) | ORAL | 0 refills | Status: DC | PRN

## 2023-09-14 MED ORDER — FLUCONAZOLE 150 MG PO TABS: 150.0000 mg | ORAL_TABLET | ORAL | 0 refills | Status: DC | PRN

## 2023-09-14 NOTE — Patient Instructions (Signed)
Rhona Leavens, thank you for joining [name], PA-C for today's virtual visit.  While this provider is not your primary care provider (PCP), if your PCP is located in our provider database this encounter information will be shared with them immediately following your visit.   A [facility] MyChart account gives you access to today's visit and all your visits, tests, and labs performed at [facility] " click here if you don't have a [facility] MyChart account or go to [email or web address]  Consent: (Patient) ARDITH TEST provided verbal consent for this virtual visit at the beginning of the encounter.  Current Medications:  Current Outpatient Medications:    amoxicillin-clavulanate (AUGMENTIN) 875-125 MG tablet, Take 1 tablet by mouth 2 (two) times daily., Disp: 20 tablet, Rfl: 0   benzonatate (TESSALON) 100 MG capsule, Take 1-2 capsules (100-200 mg total) by mouth 3 (three) times daily as needed., Disp: 30 capsule, Rfl: 0   fluconazole (DIFLUCAN) 150 MG tablet, Take 1 tablet (150 mg total) by mouth every 3 (three) days as needed., Disp: 3 tablet, Rfl: 0   predniSONE (DELTASONE) 20 MG tablet, Take 2 tablets (40 mg total) by mouth daily with breakfast., Disp: 10 tablet, Rfl: 0   promethazine-dextromethorphan (PROMETHAZINE-DM) 6.25-15 MG/5ML syrup, Take 5 mLs by mouth 4 (four) times daily as needed., Disp: 118 mL, Rfl: 0   acetaminophen (TYLENOL) 325 MG tablet, Take 2 tablets (650 mg total) by mouth every 6 (six) hours as needed for mild pain or headache., Disp: , Rfl:    alendronate (FOSAMAX) 70 MG tablet, Take 70 mg by mouth once a week. Take with a full glass of water on an empty stomach., Disp: , Rfl:    Biotin 5 MG CAPS, Take 5 mg by mouth daily., Disp: , Rfl:    cetirizine (ZYRTEC) 10 MG tablet, Take 10 mg by mouth every evening., Disp: , Rfl:    Cholecalciferol (VITAMIN D) 50 MCG (2000 UT) tablet, Take 2,000 Units by mouth daily., Disp: , Rfl:     cyanocobalamin (,VITAMIN B-12,) 1000 MCG/ML injection, Inject 1,000 mcg into the muscle every 30 (thirty) days., Disp: , Rfl: 6   diclofenac (VOLTAREN) 75 MG EC tablet, Take 1 tablet by mouth daily., Disp: , Rfl:    estradiol (ESTRACE) 1 MG tablet, Take 1.5 mg by mouth daily., Disp: , Rfl:    fexofenadine-pseudoephedrine (ALLEGRA-D 24) 180-240 MG 24 hr tablet, Take 1 tablet by mouth every morning., Disp: , Rfl:    levothyroxine (SYNTHROID, LEVOTHROID) 75 MCG tablet, Take 75 mcg by mouth daily before breakfast., Disp: , Rfl:    MAGNESIUM CITRATE PO, Take 600 mg by mouth every evening., Disp: , Rfl:    mupirocin ointment (BACTROBAN) 2 %, Apply to affected area TID for 7 days., Disp: 30 g, Rfl: 3   polyethylene glycol (MIRALAX / GLYCOLAX) 17 g packet, Take 17 g by mouth every evening., Disp: , Rfl:    testosterone cypionate (DEPOTESTOSTERONE CYPIONATE) 200 MG/ML injection, Inject into the muscle every 30 (thirty) days., Disp: , Rfl:    Medications ordered in this encounter:  Meds ordered this encounter  Medications   amoxicillin-clavulanate (AUGMENTIN) 875-125 MG tablet    Sig: Take 1 tablet by mouth 2 (two) times daily.    Dispense:  20 tablet    Refill:  0    Order Specific Question:   Supervising Provider    Answer:   Merrilee Jansky [[id number]]   fluconazole (DIFLUCAN) 150 MG tablet  Sig: Take 1 tablet (150 mg total) by mouth every 3 (three) days as needed.    Dispense:  3 tablet    Refill:  0    Order Specific Question:   Supervising Provider    Answer:   Merrilee Jansky [[id number]]   predniSONE (DELTASONE) 20 MG tablet    Sig: Take 2 tablets (40 mg total) by mouth daily with breakfast.    Dispense:  10 tablet    Refill:  0    Order Specific Question:   Supervising Provider    Answer:   Merrilee Jansky [[id number]]   promethazine-dextromethorphan (PROMETHAZINE-DM) 6.25-15 MG/5ML syrup    Sig: Take 5 mLs by mouth 4 (four) times daily as needed.    Dispense:  118 mL    Refill:  0     Order Specific Question:   Supervising Provider    Answer:   Merrilee Jansky [[id number]]   benzonatate (TESSALON) 100 MG capsule    Sig: Take 1-2 capsules (100-200 mg total) by mouth 3 (three) times daily as needed.    Dispense:  30 capsule    Refill:  0    Order Specific Question:   Supervising Provider    Answer:   Merrilee Jansky [id number]     *If you need refills on other medications prior to your next appointment, please contact your pharmacy*  Follow-Up: Call back or seek an in-person evaluation if the symptoms worsen or if the condition fails to improve as anticipated.  [facility] [phone]  Other Instructions  Acute Bronchitis, Adult  Acute bronchitis is sudden inflammation of the main airways (bronchi) that come off the windpipe (trachea) in the lungs. The swelling causes the airways to get smaller and make more mucus than normal. This can make it hard to breathe and can cause coughing or noisy breathing (wheezing). Acute bronchitis may last several weeks. The cough may last longer. Allergies, asthma, and exposure to smoke may make the condition worse. What are the causes? This condition can be caused by germs and by substances that irritate the lungs, including: Cold and flu viruses. The most common cause of this condition is the virus that causes the common cold. Bacteria. This is less common. Breathing in substances that irritate the lungs, including: Smoke from cigarettes and other forms of tobacco. Dust and pollen. Fumes from household cleaning products, gases, or burned fuel. Indoor or outdoor air pollution. What increases the risk? The following factors may make you more likely to develop this condition: A weak body's defense system, also called the immune system. A condition that affects your lungs and breathing, such as asthma. What are the signs or symptoms? Common symptoms of this condition include: Coughing. This may bring up clear,  yellow, or green mucus from your lungs (sputum). Wheezing. Runny or stuffy nose. Having too much mucus in your lungs (chest congestion). Shortness of breath. Aches and pains, including sore throat or chest. How is this diagnosed? This condition is usually diagnosed based on: Your symptoms and medical history. A physical exam. You may also have other tests, including tests to rule out other conditions, such as pneumonia. These tests include: A test of lung function. Test of a mucus sample to look for the presence of bacteria. Tests to check the oxygen level in your blood. Blood tests. Chest X-ray. How is this treated? Most cases of acute bronchitis clear up over time without treatment. Your health care provider may recommend: Drinking more  fluids to help thin your mucus so it is easier to cough up. Taking inhaled medicine (inhaler) to improve air flow in and out of your lungs. Using a vaporizer or a humidifier. These are machines that add water to the air to help you breathe better. Taking a medicine that thins mucus and clears congestion (expectorant). Taking a medicine that prevents or stops coughing (cough suppressant). It is not common to take an antibiotic medicine for this condition. Follow these instructions at home:  Take over-the-counter and prescription medicines only as told by your health care provider. Use an inhaler, vaporizer, or humidifier as told by your health care provider. Take two teaspoons (10 mL) of honey at bedtime to lessen coughing at night. Drink enough fluid to keep your urine pale yellow. Do not use any products that contain nicotine or tobacco. These products include cigarettes, chewing tobacco, and vaping devices, such as e-cigarettes. If you need help quitting, ask your health care provider. Get plenty of rest. Return to your normal activities as told by your health care provider. Ask your health care provider what activities are safe for you. Keep all  follow-up visits. This is important. How is this prevented? To lower your risk of getting this condition again: Wash your hands often with soap and water for at least 20 seconds. If soap and water are not available, use hand sanitizer. Avoid contact with people who have cold symptoms. Try not to touch your mouth, nose, or eyes with your hands. Avoid breathing in smoke or chemical fumes. Breathing smoke or chemical fumes will make your condition worse. Get the flu shot every year. Contact a health care provider if: Your symptoms do not improve after 2 weeks. You have trouble coughing up the mucus. Your cough keeps you awake at night. You have a fever. Get help right away if you: Cough up blood. Feel pain in your chest. Have severe shortness of breath. Faint or keep feeling like you are going to faint. Have a severe headache. Have a fever or chills that get worse. These symptoms may represent a serious problem that is an emergency. Do not wait to see if the symptoms will go away. Get medical help right away. Call your local emergency services (911 in the [place] Do not drive yourself to the hospital. Summary Acute bronchitis is inflammation of the main airways (bronchi) that come off the windpipe (trachea) in the lungs. The swelling causes the airways to get smaller and make more mucus than normal. Drinking more fluids can help thin your mucus so it is easier to cough up. Take over-the-counter and prescription medicines only as told by your health care provider. Do not use any products that contain nicotine or tobacco. These products include cigarettes, chewing tobacco, and vaping devices, such as e-cigarettes. If you need help quitting, ask your health care provider. Contact a health care provider if your symptoms do not improve after 2 weeks. This information is not intended to replace advice given to you by your health care provider. Make sure you discuss any questions you have with your  health care provider. Document Revised: 02/05/2022 Document Reviewed: 02/26/2021 Elsevier Patient Education  2024 [employer]   Sinus Infection, Adult A sinus infection, also called sinusitis, is inflammation of your sinuses. Sinuses are hollow spaces in the bones around your face. Your sinuses are located: Around your eyes. In the middle of your forehead. Behind your nose. In your cheekbones. Mucus normally drains out of your sinuses. When your  nasal tissues become inflamed or swollen, mucus can become trapped or blocked. This allows bacteria, viruses, and fungi to grow, which leads to infection. Most infections of the sinuses are caused by a virus. A sinus infection can develop quickly. It can last for up to 4 weeks (acute) or for more than 12 weeks (chronic). A sinus infection often develops after a cold. What are the causes? This condition is caused by anything that creates swelling in the sinuses or stops mucus from draining. This includes: Allergies. Asthma. Infection from bacteria or viruses. Deformities or blockages in your nose or sinuses. Abnormal growths in the nose (nasal polyps). Pollutants, such as chemicals or irritants in the air. Infection from fungi. This is rare. What increases the risk? You are more likely to develop this condition if you: Have a weak body defense system (immune system). Do a lot of swimming or diving. Overuse nasal sprays. Smoke. What are the signs or symptoms? The main symptoms of this condition are pain and a feeling of pressure around the affected sinuses. Other symptoms include: Stuffy nose or congestion that makes it difficult to breathe through your nose. Thick yellow or greenish drainage from your nose. Tenderness, swelling, and warmth over the affected sinuses. A cough that may get worse at night. Decreased sense of smell and taste. Extra mucus that collects in the throat or the back of the nose (postnasal drip) causing a sore throat  or bad breath. Tiredness (fatigue). Fever. How is this diagnosed? This condition is diagnosed based on: Your symptoms. Your medical history. A physical exam. Tests to find out if your condition is acute or chronic. This may include: Checking your nose for nasal polyps. Viewing your sinuses using a device that has a light (endoscope). Testing for allergies or bacteria. Imaging tests, such as an MRI or CT scan. In rare cases, a bone biopsy may be done to rule out more serious types of fungal sinus disease. How is this treated? Treatment for a sinus infection depends on the cause and whether your condition is chronic or acute. If caused by a virus, your symptoms should go away on their own within 10 days. You may be given medicines to relieve symptoms. They include: Medicines that shrink swollen nasal passages (decongestants). A spray that eases inflammation of the nostrils (topical intranasal corticosteroids). Rinses that help get rid of thick mucus in your nose (nasal saline washes). Medicines that treat allergies (antihistamines). Over-the-counter pain relievers. If caused by bacteria, your health care provider may recommend waiting to see if your symptoms improve. Most bacterial infections will get better without antibiotic medicine. You may be given antibiotics if you have: A severe infection. A weak immune system. If caused by narrow nasal passages or nasal polyps, surgery may be needed. Follow these instructions at home: Medicines Take, use, or apply over-the-counter and prescription medicines only as told by your health care provider. These may include nasal sprays. If you were prescribed an antibiotic medicine, take it as told by your health care provider. Do not stop taking the antibiotic even if you start to feel better. Hydrate and humidify  Drink enough fluid to keep your urine pale yellow. Staying hydrated will help to thin your mucus. Use a cool mist humidifier to keep  the humidity level in your home above 50%. Inhale steam for 10-15 minutes, 3-4 times a day, or as told by your health care provider. You can do this in the bathroom while a hot shower is running. Limit your  exposure to cool or dry air. Rest Rest as much as possible. Sleep with your head raised (elevated). Make sure you get enough sleep each night. General instructions  Apply a warm, moist washcloth to your face 3-4 times a day or as told by your health care provider. This will help with discomfort. Use nasal saline washes as often as told by your health care provider. Wash your hands often with soap and water to reduce your exposure to germs. If soap and water are not available, use hand sanitizer. Do not smoke. Avoid being around people who are smoking (secondhand smoke). Keep all follow-up visits. This is important. Contact a health care provider if: You have a fever. Your symptoms get worse. Your symptoms do not improve within 10 days. Get help right away if: You have a severe headache. You have persistent vomiting. You have severe pain or swelling around your face or eyes. You have vision problems. You develop confusion. Your neck is stiff. You have trouble breathing. These symptoms may be an emergency. Get help right away. Call 911. Do not wait to see if the symptoms will go away. Do not drive yourself to the hospital. Summary A sinus infection is soreness and inflammation of your sinuses. Sinuses are hollow spaces in the bones around your face. This condition is caused by nasal tissues that become inflamed or swollen. The swelling traps or blocks the flow of mucus. This allows bacteria, viruses, and fungi to grow, which leads to infection. If you were prescribed an antibiotic medicine, take it as told by your health care provider. Do not stop taking the antibiotic even if you start to feel better. Keep all follow-up visits. This is important. This information is not intended  to replace advice given to you by your health care provider. Make sure you discuss any questions you have with your health care provider. Document Revised: 09/30/2021 Document Reviewed: 09/30/2021 Elsevier Patient Education  2024 [employer]    If you have been instructed to have an in-person evaluation today at a local Urgent Care facility, please use the link below. It will take you to a list of all of our available [facility], including address, phone number and hours of operation. Please do not delay care.  [facility]  If you or a family member do not have a primary care provider, use the link below to schedule a visit and establish care. When you choose a [facility] primary care physician or advanced practice provider, you gain a long-term partner in health. Find a Primary Care Provider  Learn more about [facility]'s in-office and virtual care options: [facility] - Get Care Now

## 2023-09-14 NOTE — Progress Notes (Signed)
Virtual Visit Consent   [name], you are scheduled for a virtual visit with a [facility] provider today. Just as with appointments in the office, your consent must be obtained to participate. Your consent will be active for this visit and any virtual visit you may have with one of our providers in the next 365 days. If you have a MyChart account, a copy of this consent can be sent to you electronically.  As this is a virtual visit, video technology does not allow for your provider to perform a traditional examination. This may limit your provider's ability to fully assess your condition. If your provider identifies any concerns that need to be evaluated in person or the need to arrange testing (such as labs, EKG, etc.), we will make arrangements to do so. Although advances in technology are sophisticated, we cannot ensure that it will always work on either your end or our end. If the connection with a video visit is poor, the visit may have to be switched to a telephone visit. With either a video or telephone visit, we are not always able to ensure that we have a secure connection.  By engaging in this virtual visit, you consent to the provision of healthcare and authorize for your insurance to be billed (if applicable) for the services provided during this visit. Depending on your insurance coverage, you may receive a charge related to this service.  I need to obtain your verbal consent now. Are you willing to proceed with your visit today? Tina Olsen has provided verbal consent on 09/14/2023 for a virtual visit (video or telephone). Tina Loveless, PA-C  Date: 09/14/2023 12:52 PM  Virtual Visit via Video Note   I, Tina Olsen, connected with  Tina Olsen  ([id number], 1961-07-14) on 09/14/23 at 12:45 PM EST by a video-enabled telemedicine application and verified that I am speaking with the correct person using two identifiers.  Location: Patient: Virtual  Visit Location Patient: Home Provider: Virtual Visit Location Provider: Home Office   I discussed the limitations of evaluation and management by telemedicine and the availability of in person appointments. The patient expressed understanding and agreed to proceed.    History of Present Illness: Tina Olsen is a 62 y.o. who identifies as a female who was assigned female at birth, and is being seen today for sinus congestion and cough.  HPI: Sinusitis This is a new problem. The current episode started 1 to 4 weeks ago (2-3 weeks). The problem has been gradually worsening since onset. There has been no fever. The pain is moderate. Associated symptoms include congestion, coughing (productive with discolored mucus), headaches, sinus pressure and a sore throat (from coughing). Pertinent negatives include no chills, diaphoresis, ear pain, hoarse voice or swollen glands. (Upper back sore from coughing) Treatments tried: saline rinses, delsym, mucinex, daily allergies. The treatment provided no relief.     Problems:  Patient Active Problem List   Diagnosis Date Noted   DDD (degenerative disc disease), cervical 07/27/2023   Hypothyroidism 07/27/2023   Acute postoperative pain 03/02/2023   Hand pain, right 02/04/2023   Concussion 01/28/2023   Closed Bennett's fracture 12/30/2022   Hand pain, left 12/30/2022   Rib fractures 12/27/2022   Leukocytosis 08/04/2022   Medication monitoring encounter 08/04/2022   Scoliosis of lumbar spine, unspecified scoliosis type 05/15/2021   Carpal tunnel syndrome of right wrist 12/25/2020   Paresthesia 12/25/2020   Rheumatoid arthritis (HCC) 01/07/2018   Allergic rhinitis due  to allergen 01/22/2017   Elevated TSH 02/13/2016    Allergies:  Allergies  Allergen Reactions   Sulfa Antibiotics Rash   Medications:  Current Outpatient Medications:    amoxicillin-clavulanate (AUGMENTIN) 875-125 MG tablet, Take 1 tablet by mouth 2 (two) times daily., Disp: 20  tablet, Rfl: 0   benzonatate (TESSALON) 100 MG capsule, Take 1-2 capsules (100-200 mg total) by mouth 3 (three) times daily as needed., Disp: 30 capsule, Rfl: 0   fluconazole (DIFLUCAN) 150 MG tablet, Take 1 tablet (150 mg total) by mouth every 3 (three) days as needed., Disp: 3 tablet, Rfl: 0   predniSONE (DELTASONE) 20 MG tablet, Take 2 tablets (40 mg total) by mouth daily with breakfast., Disp: 10 tablet, Rfl: 0   promethazine-dextromethorphan (PROMETHAZINE-DM) 6.25-15 MG/5ML syrup, Take 5 mLs by mouth 4 (four) times daily as needed., Disp: 118 mL, Rfl: 0   acetaminophen (TYLENOL) 325 MG tablet, Take 2 tablets (650 mg total) by mouth every 6 (six) hours as needed for mild pain or headache., Disp: , Rfl:    alendronate (FOSAMAX) 70 MG tablet, Take 70 mg by mouth once a week. Take with a full glass of water on an empty stomach., Disp: , Rfl:    Biotin 5 MG CAPS, Take 5 mg by mouth daily., Disp: , Rfl:    cetirizine (ZYRTEC) 10 MG tablet, Take 10 mg by mouth every evening., Disp: , Rfl:    Cholecalciferol (VITAMIN D) 50 MCG (2000 UT) tablet, Take 2,000 Units by mouth daily., Disp: , Rfl:    cyanocobalamin (,VITAMIN B-12,) 1000 MCG/ML injection, Inject 1,000 mcg into the muscle every 30 (thirty) days., Disp: , Rfl: 6   diclofenac (VOLTAREN) 75 MG EC tablet, Take 1 tablet by mouth daily., Disp: , Rfl:    estradiol (ESTRACE) 1 MG tablet, Take 1.5 mg by mouth daily., Disp: , Rfl:    fexofenadine-pseudoephedrine (ALLEGRA-D 24) 180-240 MG 24 hr tablet, Take 1 tablet by mouth every morning., Disp: , Rfl:    levothyroxine (SYNTHROID, LEVOTHROID) 75 MCG tablet, Take 75 mcg by mouth daily before breakfast., Disp: , Rfl:    MAGNESIUM CITRATE PO, Take 600 mg by mouth every evening., Disp: , Rfl:    mupirocin ointment (BACTROBAN) 2 %, Apply to affected area TID for 7 days., Disp: 30 g, Rfl: 3   polyethylene glycol (MIRALAX / GLYCOLAX) 17 g packet, Take 17 g by mouth every evening., Disp: , Rfl:    testosterone  cypionate (DEPOTESTOSTERONE CYPIONATE) 200 MG/ML injection, Inject into the muscle every 30 (thirty) days., Disp: , Rfl:   Observations/Objective: Patient is well-developed, well-nourished in no acute distress.  Resting comfortably at home.  Head is normocephalic, atraumatic.  No labored breathing.  Speech is clear and coherent with logical content.  Patient is alert and oriented at baseline.    Assessment and Plan: 1. Sinobronchitis - amoxicillin-clavulanate (AUGMENTIN) 875-125 MG tablet; Take 1 tablet by mouth 2 (two) times daily.  Dispense: 20 tablet; Refill: 0 - predniSONE (DELTASONE) 20 MG tablet; Take 2 tablets (40 mg total) by mouth daily with breakfast.  Dispense: 10 tablet; Refill: 0 - promethazine-dextromethorphan (PROMETHAZINE-DM) 6.25-15 MG/5ML syrup; Take 5 mLs by mouth 4 (four) times daily as needed.  Dispense: 118 mL; Refill: 0 - benzonatate (TESSALON) 100 MG capsule; Take 1-2 capsules (100-200 mg total) by mouth 3 (three) times daily as needed.  Dispense: 30 capsule; Refill: 0  2. Antibiotic-induced yeast infection - fluconazole (DIFLUCAN) 150 MG tablet; Take 1 tablet (150 mg total)  by mouth every 3 (three) days as needed.  Dispense: 3 tablet; Refill: 0  - Worsening symptoms that have not responded to OTC medications.  - Will give Augmentin - Prednisone for inflammation - Promethazine DM and Tessalon perles for cough - Continue allergy medications.  - Diflucan given as prophylaxis as patient tends to get vaginal yeast infections with antibiotic use - Steam and humidifier can help - Stay well hydrated and get plenty of rest.  - Seek in person evaluation if no symptom improvement or if symptoms worsen   Follow Up Instructions: I discussed the assessment and treatment plan with the patient. The patient was provided an opportunity to ask questions and all were answered. The patient agreed with the plan and demonstrated an understanding of the instructions.  A copy of  instructions were sent to the patient via MyChart unless otherwise noted below.    The patient was advised to call back or seek an in-person evaluation if the symptoms worsen or if the condition fails to improve as anticipated.    Tina Loveless, PA-C

## 2023-09-16 DIAGNOSIS — E538 Deficiency of other specified B group vitamins: Secondary | ICD-10-CM | POA: Diagnosis not present

## 2023-09-17 DIAGNOSIS — M79644 Pain in right finger(s): Secondary | ICD-10-CM | POA: Diagnosis not present

## 2023-09-21 ENCOUNTER — Other Ambulatory Visit (HOSPITAL_BASED_OUTPATIENT_CLINIC_OR_DEPARTMENT_OTHER): Payer: BC Managed Care – PPO

## 2023-09-21 ENCOUNTER — Other Ambulatory Visit (HOSPITAL_BASED_OUTPATIENT_CLINIC_OR_DEPARTMENT_OTHER): Payer: Self-pay | Admitting: Family Medicine

## 2023-09-21 DIAGNOSIS — Z0000 Encounter for general adult medical examination without abnormal findings: Secondary | ICD-10-CM | POA: Diagnosis not present

## 2023-09-22 LAB — LIPID PANEL
Chol/HDL Ratio: 2.8 ratio (ref 0.0–4.4)
Cholesterol, Total: 186 mg/dL (ref 100–199)
HDL: 66 mg/dL (ref >39)
LDL Chol Calc (NIH): 98 mg/dL (ref 0–99)
Triglycerides: 129 mg/dL (ref 0–149)
VLDL Cholesterol Cal: 22 mg/dL (ref 5–40)

## 2023-09-22 LAB — T4F: T4,Free (Direct): 1.42 ng/dL (ref 0.82–1.77)

## 2023-09-22 LAB — COMPREHENSIVE METABOLIC PANEL
ALT: 9 [IU]/L (ref 0–32)
AST: 12 [IU]/L (ref 0–40)
Albumin: 4 g/dL (ref 3.8–4.9)
Alkaline Phosphatase: 40 [IU]/L — ABNORMAL LOW (ref 44–121)
BUN/Creatinine Ratio: 16 (ref 12–28)
BUN: 16 mg/dL (ref 8–27)
Bilirubin Total: 0.4 mg/dL (ref 0.0–1.2)
CO2: 24 mmol/L (ref 20–29)
Calcium: 9.2 mg/dL (ref 8.7–10.3)
Chloride: 104 mmol/L (ref 96–106)
Creatinine, Ser: 0.99 mg/dL (ref 0.57–1.00)
Globulin, Total: 2.1 g/dL (ref 1.5–4.5)
Glucose: 83 mg/dL (ref 70–99)
Potassium: 4.5 mmol/L (ref 3.5–5.2)
Sodium: 142 mmol/L (ref 134–144)
Total Protein: 6.1 g/dL (ref 6.0–8.5)
eGFR: 65 mL/min/{1.73_m2} (ref >59)

## 2023-09-22 LAB — CBC WITH DIFFERENTIAL/PLATELET
Basophils Absolute: 0.1 x10E3/uL (ref 0.0–0.2)
Basos: 1 %
EOS (ABSOLUTE): 0.3 x10E3/uL (ref 0.0–0.4)
Eos: 4 %
Hematocrit: 42.2 % (ref 34.0–46.6)
Hemoglobin: 13.8 g/dL (ref 11.1–15.9)
Immature Grans (Abs): 0 x10E3/uL (ref 0.0–0.1)
Immature Granulocytes: 0 %
Lymphocytes Absolute: 3.3 x10E3/uL — ABNORMAL HIGH (ref 0.7–3.1)
Lymphs: 46 %
MCH: 32.2 pg (ref 26.6–33.0)
MCHC: 32.7 g/dL (ref 31.5–35.7)
MCV: 98 fL — ABNORMAL HIGH (ref 79–97)
Monocytes Absolute: 0.6 x10E3/uL (ref 0.1–0.9)
Monocytes: 9 %
Neutrophils Absolute: 2.8 x10E3/uL (ref 1.4–7.0)
Neutrophils: 40 %
Platelets: 243 x10E3/uL (ref 150–450)
RBC: 4.29 x10E6/uL (ref 3.77–5.28)
RDW: 11.9 % (ref 11.7–15.4)
WBC: 7.1 x10E3/uL (ref 3.4–10.8)

## 2023-09-22 LAB — TSH RFX ON ABNORMAL TO FREE T4: TSH: 5.56 u[IU]/mL — ABNORMAL HIGH (ref 0.450–4.500)

## 2023-09-23 DIAGNOSIS — M79644 Pain in right finger(s): Secondary | ICD-10-CM | POA: Diagnosis not present

## 2023-09-24 DIAGNOSIS — E063 Autoimmune thyroiditis: Secondary | ICD-10-CM | POA: Diagnosis not present

## 2023-09-28 ENCOUNTER — Encounter (HOSPITAL_BASED_OUTPATIENT_CLINIC_OR_DEPARTMENT_OTHER): Payer: Self-pay | Admitting: Family Medicine

## 2023-09-28 ENCOUNTER — Ambulatory Visit (INDEPENDENT_AMBULATORY_CARE_PROVIDER_SITE_OTHER): Payer: BC Managed Care – PPO | Admitting: Family Medicine

## 2023-09-28 VITALS — BP 99/63 | HR 53 | Temp 97.7°F | Ht 62.0 in | Wt 134.9 lb

## 2023-09-28 DIAGNOSIS — Z0000 Encounter for general adult medical examination without abnormal findings: Secondary | ICD-10-CM | POA: Insufficient documentation

## 2023-09-28 DIAGNOSIS — E039 Hypothyroidism, unspecified: Secondary | ICD-10-CM | POA: Diagnosis not present

## 2023-09-28 DIAGNOSIS — E063 Autoimmune thyroiditis: Secondary | ICD-10-CM | POA: Diagnosis not present

## 2023-09-28 MED ORDER — LEVOTHYROXINE SODIUM 88 MCG PO TABS: 88.0000 ug | ORAL_TABLET | Freq: Every day | ORAL | 1 refills | Status: AC

## 2023-09-28 NOTE — Assessment & Plan Note (Addendum)
Routine HCM labs reviewed. HCM reviewed/discussed. Anticipatory guidance regarding healthy weight, lifestyle and choices given. Recommend healthy diet.  Recommend approximately 150 minutes/week of moderate intensity exercise Recommend regular dental and vision exams Always use seatbelt/lap and shoulder restraints Recommend using smoke alarms and checking batteries at least twice a year Recommend using sunscreen when outside Discussed colon cancer screening recommendations, options.  Patient is UTD Discussed recommendations for shingles vaccine.  Patient has received both shots Discussed tetanus immunization recommendations, patient is UTD

## 2023-09-28 NOTE — Progress Notes (Signed)
Subjective:    CC: Annual Physical Exam  HPI:  Tina Olsen is a 62 y.o. presenting for annual physical  I reviewed the past medical history, family history, social history, surgical history, and allergies today and no changes were needed.  Please see the problem list section below in epic for further details.  Past Medical History: Past Medical History:  Diagnosis Date   Allergy    Arthritis    Ostroarthritis and RA   Cancer (HCC)    skin basal and squamous- upper back, left thigh   Family history of adverse reaction to anesthesia    post op nausea   GERD (gastroesophageal reflux disease)    Headache    Hypothyroidism    IBS (irritable bowel syndrome)    Pelvic inflammatory disease 1984   Pneumonia    age   PONV (postoperative nausea and vomiting)    Past Surgical History: Past Surgical History:  Procedure Laterality Date   ABDOMINAL HYSTERECTOMY     ANTERIOR LAT LUMBAR FUSION Right 05/15/2021   Procedure: Right Lumbar three-four, Lumbar four-five Anterolateral lumbar interbody fusion;  Surgeon: Maeola Harman, MD;  Location: [facility] OR;  Service: Neurosurgery;  Laterality: Right;   APPENDECTOMY     BREAST BIOPSY Left 2017   NEG- Fibroadenoma   DIAGNOSTIC LAPAROSCOPY  2007   LAPAROSCOPIC ENDOMETRIOSIS FULGURATION  1989   LAPAROTOMY  1984   LUMBAR PERCUTANEOUS PEDICLE SCREW 2 LEVEL N/A 05/15/2021   Procedure: Percutaneous pedicle screw fixation Lumbar three-five;  Surgeon: Maeola Harman, MD;  Location: [facility] OR;  Service: Neurosurgery;  Laterality: N/A;   SINUS SURGERY WITH INSTATRAK  2003   Social History: Social History   Socioeconomic History   Marital status: Single    Spouse name: Not on file   Number of children: 0   Years of education: Not on file   Highest education level: Not on file  Occupational History   Occupation: professor/archaeologist  Tobacco Use   Smoking status: Former    Current packs/day: 0.00    Types: Cigarettes    Quit date: 2003     Years since quitting: 21.8   Smokeless tobacco: Never  Vaping Use   Vaping status: Never Used  Substance and Sexual Activity   Alcohol use: No   Drug use: No   Sexual activity: Not Currently  Other Topics Concern   Not on file  Social History Narrative   Not on file   Social Determinants of Health   Financial Resource Strain: Low Risk  (07/27/2023)   Overall Financial Resource Strain (CARDIA)    Difficulty of Paying Living Expenses: Not hard at all  Food Insecurity: No Food Insecurity (07/27/2023)   Hunger Vital Sign    Worried About Running Out of Food in the Last Year: Never true    Ran Out of Food in the Last Year: Never true  Transportation Needs: No Transportation Needs (07/27/2023)   PRAPARE - Administrator, Civil Service (Medical): No    Lack of Transportation (Non-Medical): No  Physical Activity: Sufficiently Active (07/27/2023)   Exercise Vital Sign    Days of Exercise per Week: 4 days    Minutes of Exercise per Session: 60 min  Stress: No Stress Concern Present (07/27/2023)   [employer] of Occupational Health - Occupational Stress Questionnaire    Feeling of Stress : Not at all  Social Connections: Moderately Isolated (07/27/2023)   Social Connection and Isolation Panel [NHANES]    Frequency of Communication with  Friends and Family: More than three times a week    Frequency of Social Gatherings with Friends and Family: More than three times a week    Attends Religious Services: Never    Database administrator or Organizations: Yes    Attends Engineer, structural: More than 4 times per year    Marital Status: Divorced   Family History: Family History  Problem Relation Age of Onset   Diabetes Mother    Atrial fibrillation Mother    Hypertension Father    Hypothyroidism Father    Other Father        DJD   Stroke Brother    Heart disease Maternal Grandmother    Diabetes Maternal Grandmother    Colon cancer Paternal Grandfather     Breast cancer Neg Hx    Allergies: Allergies  Allergen Reactions   Sulfa Antibiotics Rash   Medications: See med rec.  Review of Systems: No headache, visual changes, nausea, vomiting, diarrhea, constipation, dizziness, abdominal pain, skin rash, fevers, chills, night sweats, swollen lymph nodes, weight loss, chest pain, body aches, joint swelling, muscle aches, shortness of breath, mood changes, visual or auditory hallucinations.  Objective:    BP 99/63 (BP Location: Right Arm, Patient Position: Sitting, Cuff Size: Normal)   Pulse (!) 53   Temp 97.7 F (36.5 C) (Oral)   Ht 5\' 2"  (1.575 m)   Wt 134 lb 14.4 oz (61.2 kg)   SpO2 100%   BMI 24.67 kg/m   General: Well Developed, well nourished, and in no acute distress. Neuro: Alert and oriented x3, extra-ocular muscles intact, sensation grossly intact. Cranial nerves II through XII are intact, motor, sensory, and coordinative functions are all intact. HEENT: Normocephalic, atraumatic, pupils equal round reactive to light, neck supple, no masses, no lymphadenopathy, thyroid nonpalpable. Oropharynx, nasopharynx, external ear canals are unremarkable. Skin: Warm and dry, no rashes noted. Cardiac: Regular rate and rhythm, no murmurs rubs or gallops. Respiratory: Clear to auscultation bilaterally. Not using accessory muscles, speaking in full sentences. Abdominal: Soft, nontender, nondistended, positive bowel sounds, no masses, no organomegaly. Musculoskeletal: Shoulder, elbow, wrist, hip, knee, ankle stable, and with full range of motion.  Impression and Recommendations:    Wellness examination Assessment & Plan: Routine HCM labs reviewed. HCM reviewed/discussed. Anticipatory guidance regarding healthy weight, lifestyle and choices given. Recommend healthy diet.  Recommend approximately 150 minutes/week of moderate intensity exercise Recommend regular dental and vision exams Always use seatbelt/lap and shoulder restraints Recommend  using smoke alarms and checking batteries at least twice a year Recommend using sunscreen when outside Discussed colon cancer screening recommendations, options.  Patient is UTD Discussed recommendations for shingles vaccine.  Patient has received both shots Discussed tetanus immunization recommendations, patient is UTD   Hypothyroidism, unspecified type Assessment & Plan: With recent labs, TSH was found to be slightly elevated.  Patient does indicate that she has felt that she has been somewhat symptomatic recently with some increased fatigue. Given slightly elevated TSH, would be reasonable to make dose adjustment to levothyroxine.  We will increase slightly from 75 mcg to 88 mcg and monitor response to this.  She will return in about 6 weeks for recheck of TSH and we we will adjust medication accordingly.  She will also continue to follow-up with Dr. Talmage Nap  Orders: -     TSH; Future  Other orders -     Levothyroxine Sodium; Take 1 tablet (88 mcg total) by mouth daily before breakfast.  Dispense: 60 tablet;  Refill: 1  Return in about 1 year (around 09/27/2024) for CPE.   ___________________________________________ Dezirea Mccollister de [place], MD, ABFM, CAQSM Primary Care and Sports Medicine [facility]

## 2023-09-28 NOTE — Assessment & Plan Note (Signed)
With recent labs, TSH was found to be slightly elevated.  Patient does indicate that she has felt that she has been somewhat symptomatic recently with some increased fatigue. Given slightly elevated TSH, would be reasonable to make dose adjustment to levothyroxine.  We will increase slightly from 75 mcg to 88 mcg and monitor response to this.  She will return in about 6 weeks for recheck of TSH and we we will adjust medication accordingly.  She will also continue to follow-up with Dr. Talmage Nap

## 2023-09-28 NOTE — Patient Instructions (Signed)
Lab Appointment for TSH check, in 6 weeks! Happy Holidays, Stay safe and well.

## 2023-09-30 DIAGNOSIS — M25341 Other instability, right hand: Secondary | ICD-10-CM | POA: Diagnosis not present

## 2023-09-30 DIAGNOSIS — M79644 Pain in right finger(s): Secondary | ICD-10-CM | POA: Diagnosis not present

## 2023-10-01 DIAGNOSIS — F5222 Female sexual arousal disorder: Secondary | ICD-10-CM | POA: Diagnosis not present

## 2023-10-01 DIAGNOSIS — E538 Deficiency of other specified B group vitamins: Secondary | ICD-10-CM | POA: Diagnosis not present

## 2023-10-12 DIAGNOSIS — T84498A Other mechanical complication of other internal orthopedic devices, implants and grafts, initial encounter: Secondary | ICD-10-CM | POA: Diagnosis not present

## 2023-10-12 DIAGNOSIS — G8918 Other acute postprocedural pain: Secondary | ICD-10-CM | POA: Diagnosis not present

## 2023-10-12 DIAGNOSIS — X58XXXA Exposure to other specified factors, initial encounter: Secondary | ICD-10-CM | POA: Diagnosis not present

## 2023-10-12 DIAGNOSIS — S63641A Sprain of metacarpophalangeal joint of right thumb, initial encounter: Secondary | ICD-10-CM | POA: Diagnosis not present

## 2023-10-12 DIAGNOSIS — T84398A Other mechanical complication of other bone devices, implants and grafts, initial encounter: Secondary | ICD-10-CM | POA: Diagnosis not present

## 2023-10-12 DIAGNOSIS — M19041 Primary osteoarthritis, right hand: Secondary | ICD-10-CM | POA: Diagnosis not present

## 2023-10-12 DIAGNOSIS — Y999 Unspecified external cause status: Secondary | ICD-10-CM | POA: Diagnosis not present

## 2023-10-22 DIAGNOSIS — E538 Deficiency of other specified B group vitamins: Secondary | ICD-10-CM | POA: Diagnosis not present

## 2023-10-25 DIAGNOSIS — M79644 Pain in right finger(s): Secondary | ICD-10-CM | POA: Diagnosis not present

## 2023-11-08 DIAGNOSIS — E538 Deficiency of other specified B group vitamins: Secondary | ICD-10-CM | POA: Diagnosis not present

## 2023-11-08 DIAGNOSIS — F5222 Female sexual arousal disorder: Secondary | ICD-10-CM | POA: Diagnosis not present

## 2023-11-09 ENCOUNTER — Other Ambulatory Visit (HOSPITAL_BASED_OUTPATIENT_CLINIC_OR_DEPARTMENT_OTHER): Payer: BC Managed Care – PPO

## 2023-11-09 DIAGNOSIS — E039 Hypothyroidism, unspecified: Secondary | ICD-10-CM | POA: Diagnosis not present

## 2023-11-09 DIAGNOSIS — Z0000 Encounter for general adult medical examination without abnormal findings: Secondary | ICD-10-CM

## 2023-11-10 LAB — CBC WITH DIFFERENTIAL/PLATELET
Basophils Absolute: 0.1 10*3/uL (ref 0.0–0.2)
Basos: 1 %
EOS (ABSOLUTE): 0.2 10*3/uL (ref 0.0–0.4)
Eos: 4 %
Hematocrit: 42.5 % (ref 34.0–46.6)
Hemoglobin: 14.2 g/dL (ref 11.1–15.9)
Immature Grans (Abs): 0 10*3/uL (ref 0.0–0.1)
Immature Granulocytes: 0 %
Lymphocytes Absolute: 2.5 10*3/uL (ref 0.7–3.1)
Lymphs: 44 %
MCH: 32.2 pg (ref 26.6–33.0)
MCHC: 33.4 g/dL (ref 31.5–35.7)
MCV: 96 fL (ref 79–97)
Monocytes Absolute: 0.5 10*3/uL (ref 0.1–0.9)
Monocytes: 9 %
Neutrophils Absolute: 2.4 10*3/uL (ref 1.4–7.0)
Neutrophils: 42 %
Platelets: 214 10*3/uL (ref 150–450)
RBC: 4.41 x10E6/uL (ref 3.77–5.28)
RDW: 11.9 % (ref 11.7–15.4)
WBC: 5.8 10*3/uL (ref 3.4–10.8)

## 2023-11-10 LAB — COMPREHENSIVE METABOLIC PANEL
ALT: 9 [IU]/L (ref 0–32)
AST: 14 [IU]/L (ref 0–40)
Albumin: 4.4 g/dL (ref 3.8–4.9)
Alkaline Phosphatase: 42 [IU]/L — ABNORMAL LOW (ref 44–121)
BUN/Creatinine Ratio: 22 (ref 12–28)
BUN: 20 mg/dL (ref 8–27)
Bilirubin Total: 0.6 mg/dL (ref 0.0–1.2)
CO2: 21 mmol/L (ref 20–29)
Calcium: 9.3 mg/dL (ref 8.7–10.3)
Chloride: 101 mmol/L (ref 96–106)
Creatinine, Ser: 0.92 mg/dL (ref 0.57–1.00)
Globulin, Total: 2.3 g/dL (ref 1.5–4.5)
Glucose: 76 mg/dL (ref 70–99)
Potassium: 4.3 mmol/L (ref 3.5–5.2)
Sodium: 139 mmol/L (ref 134–144)
Total Protein: 6.7 g/dL (ref 6.0–8.5)
eGFR: 71 mL/min/{1.73_m2} (ref >59)

## 2023-11-10 LAB — TSH RFX ON ABNORMAL TO FREE T4: TSH: 3.29 u[IU]/mL (ref 0.450–4.500)

## 2023-11-10 LAB — LIPID PANEL
Chol/HDL Ratio: 2.9 {ratio} (ref 0.0–4.4)
Cholesterol, Total: 218 mg/dL — ABNORMAL HIGH (ref 100–199)
HDL: 76 mg/dL (ref >39)
LDL Chol Calc (NIH): 119 mg/dL — ABNORMAL HIGH (ref 0–99)
Triglycerides: 130 mg/dL (ref 0–149)
VLDL Cholesterol Cal: 23 mg/dL (ref 5–40)

## 2023-11-10 LAB — TSH

## 2023-11-22 DIAGNOSIS — M1811 Unilateral primary osteoarthritis of first carpometacarpal joint, right hand: Secondary | ICD-10-CM | POA: Diagnosis not present

## 2023-11-26 DIAGNOSIS — E538 Deficiency of other specified B group vitamins: Secondary | ICD-10-CM | POA: Diagnosis not present

## 2023-11-29 DIAGNOSIS — M79644 Pain in right finger(s): Secondary | ICD-10-CM | POA: Diagnosis not present

## 2023-11-30 DIAGNOSIS — E063 Autoimmune thyroiditis: Secondary | ICD-10-CM | POA: Diagnosis not present

## 2023-12-09 DIAGNOSIS — F5222 Female sexual arousal disorder: Secondary | ICD-10-CM | POA: Diagnosis not present

## 2023-12-09 DIAGNOSIS — E538 Deficiency of other specified B group vitamins: Secondary | ICD-10-CM | POA: Diagnosis not present

## 2023-12-23 DIAGNOSIS — E538 Deficiency of other specified B group vitamins: Secondary | ICD-10-CM | POA: Diagnosis not present

## 2023-12-29 DIAGNOSIS — M1811 Unilateral primary osteoarthritis of first carpometacarpal joint, right hand: Secondary | ICD-10-CM | POA: Diagnosis not present

## 2024-01-06 DIAGNOSIS — F5222 Female sexual arousal disorder: Secondary | ICD-10-CM | POA: Diagnosis not present

## 2024-01-06 DIAGNOSIS — E538 Deficiency of other specified B group vitamins: Secondary | ICD-10-CM | POA: Diagnosis not present

## 2024-01-17 DIAGNOSIS — M79641 Pain in right hand: Secondary | ICD-10-CM | POA: Diagnosis not present

## 2024-01-17 DIAGNOSIS — M1991 Primary osteoarthritis, unspecified site: Secondary | ICD-10-CM | POA: Diagnosis not present

## 2024-01-17 DIAGNOSIS — M0579 Rheumatoid arthritis with rheumatoid factor of multiple sites without organ or systems involvement: Secondary | ICD-10-CM | POA: Diagnosis not present

## 2024-01-17 DIAGNOSIS — Z79899 Other long term (current) drug therapy: Secondary | ICD-10-CM | POA: Diagnosis not present

## 2024-01-21 DIAGNOSIS — R7989 Other specified abnormal findings of blood chemistry: Secondary | ICD-10-CM | POA: Diagnosis not present

## 2024-02-03 DIAGNOSIS — R7989 Other specified abnormal findings of blood chemistry: Secondary | ICD-10-CM | POA: Diagnosis not present

## 2024-02-03 DIAGNOSIS — F5222 Female sexual arousal disorder: Secondary | ICD-10-CM | POA: Diagnosis not present

## 2024-02-17 DIAGNOSIS — R7989 Other specified abnormal findings of blood chemistry: Secondary | ICD-10-CM | POA: Diagnosis not present

## 2024-02-24 ENCOUNTER — Telehealth: Admitting: Physician Assistant

## 2024-02-24 DIAGNOSIS — B9689 Other specified bacterial agents as the cause of diseases classified elsewhere: Secondary | ICD-10-CM

## 2024-02-24 DIAGNOSIS — J019 Acute sinusitis, unspecified: Secondary | ICD-10-CM | POA: Diagnosis not present

## 2024-02-24 MED ORDER — FLUCONAZOLE 150 MG PO TABS: ORAL_TABLET | ORAL | 0 refills | Status: DC

## 2024-02-24 MED ORDER — IPRATROPIUM BROMIDE 0.03 % NA SOLN: 2.0000 | Freq: Two times a day (BID) | NASAL | 0 refills | Status: DC

## 2024-02-24 MED ORDER — AMOXICILLIN-POT CLAVULANATE 875-125 MG PO TABS: 1.0000 | ORAL_TABLET | Freq: Two times a day (BID) | ORAL | 0 refills | Status: DC

## 2024-02-24 MED ORDER — BENZONATATE 100 MG PO CAPS: 100.0000 mg | ORAL_CAPSULE | Freq: Three times a day (TID) | ORAL | 0 refills | Status: DC | PRN

## 2024-02-24 NOTE — Progress Notes (Signed)
 Virtual Visit Consent   Tina Olsen, you are scheduled for a virtual visit with a [facility] provider today. Just as with appointments in the office, your consent must be obtained to participate. Your consent will be active for this visit and any virtual visit you may have with one of our providers in the next 365 days. If you have a MyChart account, a copy of this consent can be sent to you electronically.  As this is a virtual visit, video technology does not allow for your provider to perform a traditional examination. This may limit your provider's ability to fully assess your condition. If your provider identifies any concerns that need to be evaluated in person or the need to arrange testing (such as labs, EKG, etc.), we will make arrangements to do so. Although advances in technology are sophisticated, we cannot ensure that it will always work on either your end or our end. If the connection with a video visit is poor, the visit may have to be switched to a telephone visit. With either a video or telephone visit, we are not always able to ensure that we have a secure connection.  By engaging in this virtual visit, you consent to the provision of healthcare and authorize for your insurance to be billed (if applicable) for the services provided during this visit. Depending on your insurance coverage, you may receive a charge related to this service.  I need to obtain your verbal consent now. Are you willing to proceed with your visit today? Tina Olsen has provided verbal consent on 02/24/2024 for a virtual visit (video or telephone). Piedad Climes, [place]  Date: 02/24/2024 8:48 AM   Virtual Visit via Video Note   I, Piedad Climes, connected with  Tina Olsen  ([id number], 06-29-1961) on 02/24/24 at  8:45 AM EDT by a video-enabled telemedicine application and verified that I am speaking with the correct person using two identifiers.  Location: Patient: Virtual  Visit Location Patient: Home Provider: Virtual Visit Location Provider: Home Office   I discussed the limitations of evaluation and management by telemedicine and the availability of in person appointments. The patient expressed understanding and agreed to proceed.    History of Present Illness: Tina Olsen is a 63 y.o. who identifies as a female who was assigned female at birth, and is being seen today for 2 weeks of increased sinus drainage, nasal congestion with cough that is productive at times. Notes some chest wall tenderness secondary to coughing. Denies fever, chills, aches. Took home COVID test which was negative. Denies recent travel or sick contact. Notes maxillary sinus pain (R>L).  Has been taking OTC cough medication and zyrtec, along with some ibuprofen.  HPI: HPI  Problems:  Patient Active Problem List   Diagnosis Date Noted   Wellness examination 09/28/2023   DDD (degenerative disc disease), cervical 07/27/2023   Hypothyroidism 07/27/2023   Acute postoperative pain 03/02/2023   Hand pain, right 02/04/2023   Concussion 01/28/2023   Closed Bennett's fracture 12/30/2022   Hand pain, left 12/30/2022   Rib fractures 12/27/2022   Leukocytosis 08/04/2022   Medication monitoring encounter 08/04/2022   Scoliosis of lumbar spine, unspecified scoliosis type 05/15/2021   Carpal tunnel syndrome of right wrist 12/25/2020   Paresthesia 12/25/2020   Rheumatoid arthritis (HCC) 01/07/2018   Allergic rhinitis due to allergen 01/22/2017   Elevated TSH 02/13/2016    Allergies:  Allergies  Allergen Reactions   Sulfa Antibiotics Rash  Medications:  Current Outpatient Medications:    amoxicillin-clavulanate (AUGMENTIN) 875-125 MG tablet, Take 1 tablet by mouth 2 (two) times daily., Disp: 14 tablet, Rfl: 0   fluconazole (DIFLUCAN) 150 MG tablet, Take 1 tablet PO once. Repeat in 3 days if needed., Disp: 2 tablet, Rfl: 0   ipratropium (ATROVENT) 0.03 % nasal spray, Place 2  sprays into both nostrils every 12 (twelve) hours., Disp: 30 mL, Rfl: 0   acetaminophen (TYLENOL) 325 MG tablet, Take 2 tablets (650 mg total) by mouth every 6 (six) hours as needed for mild pain or headache., Disp: , Rfl:    alendronate (FOSAMAX) 70 MG tablet, Take 70 mg by mouth once a week. Take with a full glass of water on an empty stomach., Disp: , Rfl:    Biotin 5 MG CAPS, Take 5 mg by mouth daily., Disp: , Rfl:    cetirizine (ZYRTEC) 10 MG tablet, Take 10 mg by mouth every evening., Disp: , Rfl:    Cholecalciferol (VITAMIN D) 50 MCG (2000 UT) tablet, Take 2,000 Units by mouth daily., Disp: , Rfl:    cyanocobalamin (,VITAMIN B-12,) 1000 MCG/ML injection, Inject 1,000 mcg into the muscle every 30 (thirty) days., Disp: , Rfl: 6   diclofenac (VOLTAREN) 75 MG EC tablet, Take 1 tablet by mouth daily., Disp: , Rfl:    estradiol (ESTRACE) 1 MG tablet, Take 1.5 mg by mouth daily., Disp: , Rfl:    levothyroxine (SYNTHROID) 88 MCG tablet, Take 1 tablet (88 mcg total) by mouth daily before breakfast., Disp: 60 tablet, Rfl: 1   MAGNESIUM CITRATE PO, Take 600 mg by mouth every evening., Disp: , Rfl:    polyethylene glycol (MIRALAX / GLYCOLAX) 17 g packet, Take 17 g by mouth every evening., Disp: , Rfl:    testosterone cypionate (DEPOTESTOSTERONE CYPIONATE) 200 MG/ML injection, Inject into the muscle every 30 (thirty) days., Disp: , Rfl:   Observations/Objective: Patient is well-developed, well-nourished in no acute distress.  Resting comfortably at home.  Head is normocephalic, atraumatic.  No labored breathing. Speech is clear and coherent with logical content.  Patient is alert and oriented at baseline.   Assessment and Plan: 1. Acute bacterial sinusitis (Primary) - ipratropium (ATROVENT) 0.03 % nasal spray; Place 2 sprays into both nostrils every 12 (twelve) hours.  Dispense: 30 mL; Refill: 0 - amoxicillin-clavulanate (AUGMENTIN) 875-125 MG tablet; Take 1 tablet by mouth 2 (two) times daily.   Dispense: 14 tablet; Refill: 0 - fluconazole (DIFLUCAN) 150 MG tablet; Take 1 tablet PO once. Repeat in 3 days if needed.  Dispense: 2 tablet; Refill: 0  Rx Augmentin.  Increase fluids.  Rest.  Saline nasal spray.  Probiotic.  Mucinex as directed.  Humidifier in bedroom. Atrovent and Tessalon per orders. Diflucan placed on file giving history of antibiotic-induced yeast infections.  Call or return to clinic if symptoms are not improving.   Follow Up Instructions: I discussed the assessment and treatment plan with the patient. The patient was provided an opportunity to ask questions and all were answered. The patient agreed with the plan and demonstrated an understanding of the instructions.  A copy of instructions were sent to the patient via MyChart unless otherwise noted below.   The patient was advised to call back or seek an in-person evaluation if the symptoms worsen or if the condition fails to improve as anticipated.    Piedad Climes, PA-C

## 2024-02-24 NOTE — Patient Instructions (Signed)
 Claudis Cumber, thank you for joining Hyla Maillard, PA-C for today's virtual visit.  While this provider is not your primary care provider (PCP), if your PCP is located in our provider database this encounter information will be shared with them immediately following your visit.   A [facility] MyChart account gives you access to today's visit and all your visits, tests, and labs performed at [facility] " click here if you don't have a [facility] MyChart account or go to [email or web address]  Consent: (Patient) Tina Olsen provided verbal consent for this virtual visit at the beginning of the encounter.  Current Medications:  Current Outpatient Medications:    acetaminophen (TYLENOL) 325 MG tablet, Take 2 tablets (650 mg total) by mouth every 6 (six) hours as needed for mild pain or headache., Disp: , Rfl:    alendronate (FOSAMAX) 70 MG tablet, Take 70 mg by mouth once a week. Take with a full glass of water on an empty stomach., Disp: , Rfl:    Biotin 5 MG CAPS, Take 5 mg by mouth daily., Disp: , Rfl:    cetirizine (ZYRTEC) 10 MG tablet, Take 10 mg by mouth every evening., Disp: , Rfl:    Cholecalciferol (VITAMIN D) 50 MCG (2000 UT) tablet, Take 2,000 Units by mouth daily., Disp: , Rfl:    cyanocobalamin (,VITAMIN B-12,) 1000 MCG/ML injection, Inject 1,000 mcg into the muscle every 30 (thirty) days., Disp: , Rfl: 6   diclofenac (VOLTAREN) 75 MG EC tablet, Take 1 tablet by mouth daily., Disp: , Rfl:    estradiol (ESTRACE) 1 MG tablet, Take 1.5 mg by mouth daily., Disp: , Rfl:    levothyroxine (SYNTHROID) 88 MCG tablet, Take 1 tablet (88 mcg total) by mouth daily before breakfast., Disp: 60 tablet, Rfl: 1   MAGNESIUM CITRATE PO, Take 600 mg by mouth every evening., Disp: , Rfl:    polyethylene glycol (MIRALAX / GLYCOLAX) 17 g packet, Take 17 g by mouth every evening., Disp: , Rfl:    testosterone cypionate (DEPOTESTOSTERONE CYPIONATE) 200 MG/ML injection,  Inject into the muscle every 30 (thirty) days., Disp: , Rfl:    Medications ordered in this encounter:  No orders of the defined types were placed in this encounter.    *If you need refills on other medications prior to your next appointment, please contact your pharmacy*  Follow-Up: Call back or seek an in-person evaluation if the symptoms worsen or if the condition fails to improve as anticipated.  [facility] [phone]  Other Instructions Please take antibiotic as directed.  Increase fluid intake.  Use Saline nasal spray.  Take a daily multivitamin. Use the Atrovent spray and Tessalon as directed..  Place a humidifier in the bedroom.  Please call or return clinic if symptoms are not improving.  Sinusitis Sinusitis is redness, soreness, and swelling (inflammation) of the paranasal sinuses. Paranasal sinuses are air pockets within the bones of your face (beneath the eyes, the middle of the forehead, or above the eyes). In healthy paranasal sinuses, mucus is able to drain out, and air is able to circulate through them by way of your nose. However, when your paranasal sinuses are inflamed, mucus and air can become trapped. This can allow bacteria and other germs to grow and cause infection. Sinusitis can develop quickly and last only a short time (acute) or continue over a long period (chronic). Sinusitis that lasts for more than 12 weeks is considered chronic.  CAUSES  Causes of  sinusitis include: Allergies. Structural abnormalities, such as displacement of the cartilage that separates your nostrils (deviated septum), which can decrease the air flow through your nose and sinuses and affect sinus drainage. Functional abnormalities, such as when the small hairs (cilia) that line your sinuses and help remove mucus do not work properly or are not present. SYMPTOMS  Symptoms of acute and chronic sinusitis are the same. The primary symptoms are pain and pressure around the  affected sinuses. Other symptoms include: Upper toothache. Earache. Headache. Bad breath. Decreased sense of smell and taste. A cough, which worsens when you are lying flat. Fatigue. Fever. Thick drainage from your nose, which often is green and may contain pus (purulent). Swelling and warmth over the affected sinuses. DIAGNOSIS  Your caregiver will perform a physical exam. During the exam, your caregiver may: Look in your nose for signs of abnormal growths in your nostrils (nasal polyps). Tap over the affected sinus to check for signs of infection. View the inside of your sinuses (endoscopy) with a special imaging device with a light attached (endoscope), which is inserted into your sinuses. If your caregiver suspects that you have chronic sinusitis, one or more of the following tests may be recommended: Allergy tests. Nasal culture A sample of mucus is taken from your nose and sent to a lab and screened for bacteria. Nasal cytology A sample of mucus is taken from your nose and examined by your caregiver to determine if your sinusitis is related to an allergy. TREATMENT  Most cases of acute sinusitis are related to a viral infection and will resolve on their own within 10 days. Sometimes medicines are prescribed to help relieve symptoms (pain medicine, decongestants, nasal steroid sprays, or saline sprays).  However, for sinusitis related to a bacterial infection, your caregiver will prescribe antibiotic medicines. These are medicines that will help kill the bacteria causing the infection.  Rarely, sinusitis is caused by a fungal infection. In theses cases, your caregiver will prescribe antifungal medicine. For some cases of chronic sinusitis, surgery is needed. Generally, these are cases in which sinusitis recurs more than 3 times per year, despite other treatments. HOME CARE INSTRUCTIONS  Drink plenty of water. Water helps thin the mucus so your sinuses can drain more easily. Use a  humidifier. Inhale steam 3 to 4 times a day (for example, sit in the bathroom with the shower running). Apply a warm, moist washcloth to your face 3 to 4 times a day, or as directed by your caregiver. Use saline nasal sprays to help moisten and clean your sinuses. Take over-the-counter or prescription medicines for pain, discomfort, or fever only as directed by your caregiver. SEEK IMMEDIATE MEDICAL CARE IF: You have increasing pain or severe headaches. You have nausea, vomiting, or drowsiness. You have swelling around your face. You have vision problems. You have a stiff neck. You have difficulty breathing. MAKE SURE YOU:  Understand these instructions. Will watch your condition. Will get help right away if you are not doing well or get worse. Document Released: 10/26/2005 Document Revised: 01/18/2012 Document Reviewed: 11/10/2011 [facility] Patient Information 2014 [place], [place].    If you have been instructed to have an in-person evaluation today at a local Urgent Care facility, please use the link below. It will take you to a list of all of our available [facility], including address, phone number and hours of operation. Please do not delay care.   Urgent Cares  If you or a family member do  not have a primary care provider, use the link below to schedule a visit and establish care. When you choose a [facility] primary care physician or advanced practice provider, you gain a long-term partner in health. Find a Primary Care Provider  Learn more about [facility]'s in-office and virtual care options: [facility] - Get Care Now

## 2024-03-06 DIAGNOSIS — M5481 Occipital neuralgia: Secondary | ICD-10-CM | POA: Diagnosis not present

## 2024-03-06 DIAGNOSIS — G43109 Migraine with aura, not intractable, without status migrainosus: Secondary | ICD-10-CM | POA: Diagnosis not present

## 2024-03-06 DIAGNOSIS — M7918 Myalgia, other site: Secondary | ICD-10-CM | POA: Diagnosis not present

## 2024-03-07 DIAGNOSIS — M5481 Occipital neuralgia: Secondary | ICD-10-CM | POA: Diagnosis not present

## 2024-03-07 DIAGNOSIS — M7918 Myalgia, other site: Secondary | ICD-10-CM | POA: Diagnosis not present

## 2024-03-09 DIAGNOSIS — R7989 Other specified abnormal findings of blood chemistry: Secondary | ICD-10-CM | POA: Diagnosis not present

## 2024-03-09 DIAGNOSIS — F5222 Female sexual arousal disorder: Secondary | ICD-10-CM | POA: Diagnosis not present

## 2024-03-10 ENCOUNTER — Other Ambulatory Visit: Payer: Self-pay | Admitting: Neurology

## 2024-03-10 DIAGNOSIS — G43109 Migraine with aura, not intractable, without status migrainosus: Secondary | ICD-10-CM

## 2024-03-13 DIAGNOSIS — L814 Other melanin hyperpigmentation: Secondary | ICD-10-CM | POA: Diagnosis not present

## 2024-03-13 DIAGNOSIS — L570 Actinic keratosis: Secondary | ICD-10-CM | POA: Diagnosis not present

## 2024-03-13 DIAGNOSIS — L821 Other seborrheic keratosis: Secondary | ICD-10-CM | POA: Diagnosis not present

## 2024-03-13 DIAGNOSIS — Z85828 Personal history of other malignant neoplasm of skin: Secondary | ICD-10-CM | POA: Diagnosis not present

## 2024-03-13 DIAGNOSIS — L578 Other skin changes due to chronic exposure to nonionizing radiation: Secondary | ICD-10-CM | POA: Diagnosis not present

## 2024-03-14 DIAGNOSIS — R768 Other specified abnormal immunological findings in serum: Secondary | ICD-10-CM | POA: Insufficient documentation

## 2024-03-14 DIAGNOSIS — M150 Primary generalized (osteo)arthritis: Secondary | ICD-10-CM | POA: Insufficient documentation

## 2024-03-14 DIAGNOSIS — M2550 Pain in unspecified joint: Secondary | ICD-10-CM | POA: Insufficient documentation

## 2024-03-14 DIAGNOSIS — Z79899 Other long term (current) drug therapy: Secondary | ICD-10-CM | POA: Insufficient documentation

## 2024-05-04 ENCOUNTER — Encounter: Payer: Self-pay | Admitting: Neurology

## 2024-05-22 ENCOUNTER — Ambulatory Visit
Admission: RE | Admit: 2024-05-22 | Discharge: 2024-05-22 | Disposition: A | Discharge status: Home / Self Care | Source: Ambulatory Visit | Attending: Neurology | Admitting: Neurology

## 2024-05-22 DIAGNOSIS — J3489 Other specified disorders of nose and nasal sinuses: Secondary | ICD-10-CM | POA: Diagnosis not present

## 2024-05-22 DIAGNOSIS — I619 Nontraumatic intracerebral hemorrhage, unspecified: Secondary | ICD-10-CM | POA: Diagnosis not present

## 2024-05-22 DIAGNOSIS — G43109 Migraine with aura, not intractable, without status migrainosus: Secondary | ICD-10-CM

## 2024-05-22 MED ORDER — GADOPICLENOL 0.5 MMOL/ML IV SOLN
7.5000 mL | Freq: Once | INTRAVENOUS | Status: AC | PRN
  Administered 2024-05-22: 7.5 mL via INTRAVENOUS

## 2024-05-30 DIAGNOSIS — F5222 Female sexual arousal disorder: Secondary | ICD-10-CM | POA: Diagnosis not present

## 2024-05-30 DIAGNOSIS — E538 Deficiency of other specified B group vitamins: Secondary | ICD-10-CM | POA: Diagnosis not present

## 2024-06-13 DIAGNOSIS — R7989 Other specified abnormal findings of blood chemistry: Secondary | ICD-10-CM | POA: Diagnosis not present

## 2024-07-03 DIAGNOSIS — E538 Deficiency of other specified B group vitamins: Secondary | ICD-10-CM | POA: Diagnosis not present

## 2024-07-03 DIAGNOSIS — Z1331 Encounter for screening for depression: Secondary | ICD-10-CM | POA: Diagnosis not present

## 2024-07-03 DIAGNOSIS — R87622 Low grade squamous intraepithelial lesion on cytologic smear of vagina (LGSIL): Secondary | ICD-10-CM | POA: Diagnosis not present

## 2024-07-03 DIAGNOSIS — Z1272 Encounter for screening for malignant neoplasm of vagina: Secondary | ICD-10-CM | POA: Diagnosis not present

## 2024-07-03 DIAGNOSIS — F5222 Female sexual arousal disorder: Secondary | ICD-10-CM | POA: Diagnosis not present

## 2024-07-03 DIAGNOSIS — Z01411 Encounter for gynecological examination (general) (routine) with abnormal findings: Secondary | ICD-10-CM | POA: Diagnosis not present

## 2024-07-04 ENCOUNTER — Encounter: Payer: Self-pay | Admitting: Gastroenterology

## 2024-07-04 ENCOUNTER — Other Ambulatory Visit: Payer: Self-pay | Admitting: Obstetrics and Gynecology

## 2024-07-04 DIAGNOSIS — Z1231 Encounter for screening mammogram for malignant neoplasm of breast: Secondary | ICD-10-CM

## 2024-07-06 DIAGNOSIS — M7918 Myalgia, other site: Secondary | ICD-10-CM | POA: Diagnosis not present

## 2024-07-06 DIAGNOSIS — G43109 Migraine with aura, not intractable, without status migrainosus: Secondary | ICD-10-CM | POA: Diagnosis not present

## 2024-07-06 DIAGNOSIS — M5481 Occipital neuralgia: Secondary | ICD-10-CM | POA: Diagnosis not present

## 2024-07-06 DIAGNOSIS — I6789 Other cerebrovascular disease: Secondary | ICD-10-CM | POA: Diagnosis not present

## 2024-07-07 DIAGNOSIS — F5222 Female sexual arousal disorder: Secondary | ICD-10-CM | POA: Diagnosis not present

## 2024-07-19 ENCOUNTER — Telehealth (HOSPITAL_BASED_OUTPATIENT_CLINIC_OR_DEPARTMENT_OTHER): Payer: Self-pay | Admitting: Family Medicine

## 2024-07-19 ENCOUNTER — Telehealth (INDEPENDENT_AMBULATORY_CARE_PROVIDER_SITE_OTHER): Admitting: Family Medicine

## 2024-07-19 ENCOUNTER — Other Ambulatory Visit (HOSPITAL_BASED_OUTPATIENT_CLINIC_OR_DEPARTMENT_OTHER): Payer: Self-pay | Admitting: *Deleted

## 2024-07-19 ENCOUNTER — Encounter (HOSPITAL_BASED_OUTPATIENT_CLINIC_OR_DEPARTMENT_OTHER): Payer: Self-pay | Admitting: Family Medicine

## 2024-07-19 ENCOUNTER — Telehealth (HOSPITAL_BASED_OUTPATIENT_CLINIC_OR_DEPARTMENT_OTHER): Payer: Self-pay | Admitting: *Deleted

## 2024-07-19 ENCOUNTER — Ambulatory Visit: Payer: Self-pay

## 2024-07-19 DIAGNOSIS — R109 Unspecified abdominal pain: Secondary | ICD-10-CM | POA: Diagnosis not present

## 2024-07-19 DIAGNOSIS — E538 Deficiency of other specified B group vitamins: Secondary | ICD-10-CM | POA: Diagnosis not present

## 2024-07-19 DIAGNOSIS — R1110 Vomiting, unspecified: Secondary | ICD-10-CM

## 2024-07-19 DIAGNOSIS — R197 Diarrhea, unspecified: Secondary | ICD-10-CM | POA: Diagnosis not present

## 2024-07-19 DIAGNOSIS — M79609 Pain in unspecified limb: Secondary | ICD-10-CM | POA: Insufficient documentation

## 2024-07-19 NOTE — Telephone Encounter (Signed)
 Who does the authorization for ct/us ?

## 2024-07-19 NOTE — Assessment & Plan Note (Signed)
 3 weeks ago, started with watery diarrhea, some leakage at night. Shifted to more abdominal pain, cramping with most foods - some during the day, more at night. Then began to have episodes of vomiting at night - indicates that she tends to eat more food at night, less during the day. Fluids not as bothersome, has been able to keep fluids down. She did call her GI, they indicated that earliest appointment is October. Has history of colitis in 2022. Saw GI then, had colonoscopy. Diverticulosis had been seen on CT scan in ED at time of initial evaluation. Denies fevers, chills, sweats. No blood in stool. During virtual appointment, patient does not appear to be in acute distress.  She is able to speak in complete sentences, no obvious evidence of discomfort. We discussed considerations.  Did discuss low threshold for presenting to the emergency department given her history as well as ongoing symptoms.  Ultimately, we could initially proceed with outpatient evaluation given that she is tolerating fluids appropriately with slight decrease in food intake.  We can proceed with labs initially as well as plan for obtaining CT scan.  Advised that if she does note any changes in symptoms, particularly if she has any worsening abdominal pain, vomiting, diarrhea or is not able to keep fluids down, then would recommend presenting to emergency department for further evaluation.  Patient voices understanding and agreement.  She will come by the office later today for labs and CT scan order has been put in as stat

## 2024-07-19 NOTE — Telephone Encounter (Signed)
  FYI Only or Action Required?: Action required by provider: update on patient condition.  Patient was last seen in primary care on 02/24/2024 by Gladis Elsie BROCKS, PA-C.  Called Nurse Triage reporting Vomiting, Diarrhea, and Abdominal Pain.  Symptoms began several weeks ago.  Interventions attempted: Nothing.  Symptoms are: gradually worsening.  Triage Disposition: Go to ED Now (or PCP Triage)  Patient/caregiver understands and will follow disposition?: Yes   Copied from CRM #[id number]. Topic: Clinical - Red Word Triage >> Jul 19, 2024  9:01 AM Carlatta H wrote: [facility] that prompted transfer to Nurse Triage: Patient has been vomiting, diarrhea and severe stomach cramps//Symptoms for about 2 weeks Reason for Disposition  [1] MODERATE to SEVERE vomiting (e.g., 3 or more times/day) AND [2] age > 60 years  Answer Assessment - Initial Assessment Questions 1. VOMITING SEVERITY: How many times have you vomited in the past 24 hours?      Once overnight 2. ONSET: When did the vomiting begin?      Five days ago 3. FLUIDS: What fluids or food have you vomited up today? Have you been able to keep any fluids down?     Able to keep down fluids down 4. ABDOMEN PAIN: Are your having any abdomen pain? If Yes : How bad is it and what does it feel like? (e.g., crampy, dull, intermittent, constant)      Cramping all day that worsens at night, especially after meals 5. DIARRHEA: Is there any diarrhea? If Yes, ask: How many times today?      Symptoms began more than two weeks ago with diarrhea, diarrhea is resolving. Diarrhea is mainly at night 6. CONTACTS: Is there anyone else in the family with the same symptoms?      denies 7. CAUSE: What do you think is causing your vomiting?     History of diverticulitis 8. HYDRATION STATUS: Any signs of dehydration? (e.g., dry mouth [not only dry lips], too weak to stand) When did you last urinate?     Concern for dehydration, urinating  small amounts more frequently, urine more concentrated 9. OTHER SYMPTOMS: Do you have any other symptoms? (e.g., fever, headache, vertigo, vomiting blood or coffee grounds, recent head injury)     Very gassy 10. PREGNANCY: Is there any chance you are pregnant? When was your last menstrual period?       N/A  Protocols used: Vomiting-A-AH

## 2024-07-19 NOTE — Telephone Encounter (Signed)
 Copied from CRM #[id number]. Topic: General - Other >> Jul 19, 2024  4:46 PM Wess RAMAN wrote: Reason for CRM: Patient wanted to let Dr. De Peru know she has her imaging appt scheduled for tomorrow so the clinic staff could put the authorization in for her ct scan.  Callback #: [phone]    Note the shara is pending.

## 2024-07-19 NOTE — Telephone Encounter (Signed)
 Can you please call them and let them know this needs to be scheduled first

## 2024-07-19 NOTE — Progress Notes (Signed)
   Virtual Visit   I connected with  Tina Olsen  on 07/19/24 by telehealth and verified that I am speaking with the correct person using two identifiers. Visit completed via video.  I discussed the limitations, risks, security and privacy concerns of performing an evaluation and management service by telephone, including the higher likelihood of inaccurate diagnosis and treatment, and the availability of in person appointments.  We also discussed the likely need of an additional face to face encounter for complete and high quality delivery of care.  I also discussed with the patient that there may be a patient responsible charge related to this service. The patient expressed understanding and wishes to proceed.  Provider location is in medical facility. Patient location is at their home, different from provider location. People involved in care of the patient during this telehealth encounter were myself, my nurse/medical assistant, and my front office/scheduling team member.  Review of Systems: No fevers, chills, night sweats, weight loss, chest pain, or shortness of breath.   Objective Findings:    General: Speaking full sentences, no audible heavy breathing.  Sounds alert and appropriately interactive.    Independent interpretation of tests performed by another provider:   None.  Brief History, Exam, Impression, and Recommendations:    Vomiting and diarrhea 3 weeks ago, started with watery diarrhea, some leakage at night. Shifted to more abdominal pain, cramping with most foods - some during the day, more at night. Then began to have episodes of vomiting at night - indicates that she tends to eat more food at night, less during the day. Fluids not as bothersome, has been able to keep fluids down. She did call her GI, they indicated that earliest appointment is October. Has history of colitis in 2022. Saw GI then, had colonoscopy. Diverticulosis had been seen on CT scan in ED at  time of initial evaluation. Denies fevers, chills, sweats. No blood in stool. During virtual appointment, patient does not appear to be in acute distress.  She is able to speak in complete sentences, no obvious evidence of discomfort. We discussed considerations.  Did discuss low threshold for presenting to the emergency department given her history as well as ongoing symptoms.  Ultimately, we could initially proceed with outpatient evaluation given that she is tolerating fluids appropriately with slight decrease in food intake.  We can proceed with labs initially as well as plan for obtaining CT scan.  Advised that if she does note any changes in symptoms, particularly if she has any worsening abdominal pain, vomiting, diarrhea or is not able to keep fluids down, then would recommend presenting to emergency department for further evaluation.  Patient voices understanding and agreement.  She will come by the office later today for labs and CT scan order has been put in as stat  I discussed the above assessment and treatment plan with the patient. The patient was provided an opportunity to ask questions and all were answered. The patient agreed with the plan and demonstrated an understanding of the instructions.  The patient was advised to call back or seek an in-person evaluation if the symptoms worsen or if the condition fails to improve as anticipated.  I provided 14 minutes of face to face and non-face-to-face time during this encounter date, time was needed to gather information, review chart, records, communicate/coordinate with staff remotely, as well as complete documentation.   ___________________________________________ [place] Freyre de Peru, MD, ABFM, CAQSM Primary Care and Sports Medicine [facility]

## 2024-07-19 NOTE — Telephone Encounter (Signed)
 Patient made an appointment to discuss virtually with de peru

## 2024-07-19 NOTE — Telephone Encounter (Signed)
 Copied from CRM [id number]. Topic: Referral - Question >> Jul 19, 2024 12:05 PM Emylou G wrote: Reason for CRM: Synethia [phone] x [phone] w/[facility].. needs to if we got the authorization from the ins.SABRA trying to get her scheduled.SABRA

## 2024-07-20 ENCOUNTER — Telehealth (HOSPITAL_BASED_OUTPATIENT_CLINIC_OR_DEPARTMENT_OTHER): Payer: Self-pay | Admitting: *Deleted

## 2024-07-20 ENCOUNTER — Ambulatory Visit (HOSPITAL_BASED_OUTPATIENT_CLINIC_OR_DEPARTMENT_OTHER)
Admission: RE | Admit: 2024-07-20 | Discharge: 2024-07-20 | Disposition: A | Discharge status: Home / Self Care | Source: Ambulatory Visit | Attending: Family Medicine | Admitting: Family Medicine

## 2024-07-20 ENCOUNTER — Ambulatory Visit (HOSPITAL_BASED_OUTPATIENT_CLINIC_OR_DEPARTMENT_OTHER): Payer: Self-pay | Admitting: Family Medicine

## 2024-07-20 DIAGNOSIS — R1110 Vomiting, unspecified: Secondary | ICD-10-CM | POA: Insufficient documentation

## 2024-07-20 DIAGNOSIS — R197 Diarrhea, unspecified: Secondary | ICD-10-CM | POA: Insufficient documentation

## 2024-07-20 DIAGNOSIS — R109 Unspecified abdominal pain: Secondary | ICD-10-CM | POA: Insufficient documentation

## 2024-07-20 LAB — COMPREHENSIVE METABOLIC PANEL WITH GFR
ALT: 11 IU/L (ref 0–32)
AST: 15 IU/L (ref 0–40)
Albumin: 4.1 g/dL (ref 3.9–4.9)
Alkaline Phosphatase: 40 IU/L — ABNORMAL LOW (ref 44–121)
BUN/Creatinine Ratio: 26 (ref 12–28)
BUN: 20 mg/dL (ref 8–27)
Bilirubin Total: 0.6 mg/dL (ref 0.0–1.2)
CO2: 21 mmol/L (ref 20–29)
Calcium: 9.1 mg/dL (ref 8.7–10.3)
Chloride: 105 mmol/L (ref 96–106)
Creatinine, Ser: 0.77 mg/dL (ref 0.57–1.00)
Globulin, Total: 2 g/dL (ref 1.5–4.5)
Glucose: 91 mg/dL (ref 70–99)
Potassium: 4 mmol/L (ref 3.5–5.2)
Sodium: 142 mmol/L (ref 134–144)
Total Protein: 6.1 g/dL (ref 6.0–8.5)
eGFR: 88 mL/min/1.73 (ref >59)

## 2024-07-20 LAB — CBC WITH DIFFERENTIAL/PLATELET
Basophils Absolute: 0 x10E3/uL (ref 0.0–0.2)
Basos: 1 %
EOS (ABSOLUTE): 0.2 x10E3/uL (ref 0.0–0.4)
Eos: 2 %
Hematocrit: 38.2 % (ref 34.0–46.6)
Hemoglobin: 12.6 g/dL (ref 11.1–15.9)
Immature Grans (Abs): 0 x10E3/uL (ref 0.0–0.1)
Immature Granulocytes: 0 %
Lymphocytes Absolute: 3 x10E3/uL (ref 0.7–3.1)
Lymphs: 36 %
MCH: 32.2 pg (ref 26.6–33.0)
MCHC: 33 g/dL (ref 31.5–35.7)
MCV: 98 fL — ABNORMAL HIGH (ref 79–97)
Monocytes Absolute: 0.6 x10E3/uL (ref 0.1–0.9)
Monocytes: 7 %
Neutrophils Absolute: 4.4 x10E3/uL (ref 1.4–7.0)
Neutrophils: 54 %
Platelets: 236 x10E3/uL (ref 150–450)
RBC: 3.91 x10E6/uL (ref 3.77–5.28)
RDW: 11.7 % (ref 11.7–15.4)
WBC: 8.2 x10E3/uL (ref 3.4–10.8)

## 2024-07-20 LAB — LIPASE: Lipase: 28 U/L (ref 14–72)

## 2024-07-20 MED ORDER — IOHEXOL 300 MG/ML  SOLN
100.0000 mL | Freq: Once | INTRAMUSCULAR | Status: AC | PRN
  Administered 2024-07-20: 85 mL via INTRAVENOUS

## 2024-07-20 NOTE — Telephone Encounter (Signed)
 Called preservice center did not receive answer LVM for preservice center to call back need to know what other information is needed.

## 2024-07-20 NOTE — Telephone Encounter (Signed)
 Copied from CRM [id number]. Topic: General - Other >> Jul 20, 2024  8:29 AM Avram MATSU wrote: Reason for CRM: [name] is calling from [facility] and stated a PA was submitted but there is wrong information on it. She would like to speak with someone to correct it. Please advise  [phone] ext [phone]

## 2024-07-21 ENCOUNTER — Ambulatory Visit
Admission: RE | Admit: 2024-07-21 | Discharge: 2024-07-21 | Disposition: A | Discharge status: Home / Self Care | Source: Ambulatory Visit | Attending: Obstetrics and Gynecology | Admitting: Obstetrics and Gynecology

## 2024-07-21 DIAGNOSIS — Z1231 Encounter for screening mammogram for malignant neoplasm of breast: Secondary | ICD-10-CM | POA: Insufficient documentation

## 2024-07-31 DIAGNOSIS — F5222 Female sexual arousal disorder: Secondary | ICD-10-CM | POA: Diagnosis not present

## 2024-08-01 DIAGNOSIS — M0579 Rheumatoid arthritis with rheumatoid factor of multiple sites without organ or systems involvement: Secondary | ICD-10-CM | POA: Diagnosis not present

## 2024-08-01 DIAGNOSIS — Z79899 Other long term (current) drug therapy: Secondary | ICD-10-CM | POA: Diagnosis not present

## 2024-08-01 DIAGNOSIS — R109 Unspecified abdominal pain: Secondary | ICD-10-CM | POA: Diagnosis not present

## 2024-08-01 DIAGNOSIS — M1991 Primary osteoarthritis, unspecified site: Secondary | ICD-10-CM | POA: Diagnosis not present

## 2024-08-04 DIAGNOSIS — F5222 Female sexual arousal disorder: Secondary | ICD-10-CM | POA: Diagnosis not present

## 2024-08-04 DIAGNOSIS — E538 Deficiency of other specified B group vitamins: Secondary | ICD-10-CM | POA: Diagnosis not present

## 2024-08-18 DIAGNOSIS — E538 Deficiency of other specified B group vitamins: Secondary | ICD-10-CM | POA: Diagnosis not present

## 2024-08-24 ENCOUNTER — Encounter: Payer: Self-pay | Admitting: Gastroenterology

## 2024-08-24 ENCOUNTER — Other Ambulatory Visit

## 2024-08-24 ENCOUNTER — Ambulatory Visit: Admitting: Gastroenterology

## 2024-08-24 VITALS — BP 104/64 | HR 62 | Ht 62.0 in | Wt 132.2 lb

## 2024-08-24 DIAGNOSIS — R159 Full incontinence of feces: Secondary | ICD-10-CM | POA: Diagnosis not present

## 2024-08-24 DIAGNOSIS — R1013 Epigastric pain: Secondary | ICD-10-CM | POA: Insufficient documentation

## 2024-08-24 DIAGNOSIS — R1085 Abdominal pain of multiple sites: Secondary | ICD-10-CM | POA: Diagnosis not present

## 2024-08-24 DIAGNOSIS — R112 Nausea with vomiting, unspecified: Secondary | ICD-10-CM

## 2024-08-24 DIAGNOSIS — R1011 Right upper quadrant pain: Secondary | ICD-10-CM

## 2024-08-24 DIAGNOSIS — E739 Lactose intolerance, unspecified: Secondary | ICD-10-CM | POA: Diagnosis not present

## 2024-08-24 NOTE — Progress Notes (Signed)
 08/24/2024 Tina Olsen [id number] 30-Jan-1961   Discussed the use of AI scribe software for clinical note transcription with the patient, who gave verbal consent to proceed.  History of Present Illness Tina Olsen is a 63 year old female with diverticulosis who presents with complaints of episodic nocturnal abdominal pain and vomiting.  She had been experiencing severe stomach cramps approximately six weeks ago, occurring in the middle of the night and waking her up, often followed by vomiting. This pattern persisted nightly for about two weeks, with vomiting occurring four nights in a row.  During the same period, she experienced episodes of loose diarrhea, severe enough to cause nocturnal incontinence, which she found distressing. This type of incontinence had only occurred a few times in the past decade since she turned [age] prior to the severe episodes 6 weeks ago.  Her brother is a physical therapist and suggested pelvic floor physical therapy exercises, which she has started.  A CT scan performed by her primary care physician did not reveal any structural abnormalities, although the report mistakenly noted a normal appendix despite her having had an appendectomy. She has a history of diverticulosis, discovered after a bout of segmental colitis associated with diverticulitis (SCAD) following back surgery, which led to an ER visit and subsequent colonoscopy as below.  She has been taking diclofenac for RA for years. Her rheumatologist suggested she might have developed lactose intolerance, which she has been managing with lactase pills, reducing her symptoms by about 80%. She consumes dairy daily, including yogurt and coffee creamer, and has noticed chronic symptoms for years. However, the recent severe symptoms 6 weeks ago were different from her usual lactose intolerance symptoms.  No recent acid reflux or indigestion, although she had issues with these in the  past.  Colonoscopy 07/2021:  - Diverticulosis in the sigmoid colon and in the descending colon. - Congested and erythematous mucosa in the sigmoid colon. Biopsied. - The examination was otherwise normal on direct and retroflexion views.  Surgical [P], sigmoid bx - PATCHY MILDLY ACTIVE CHRONIC NONSPECIFIC COLITIS. SEE NOTE - NEGATIVE FOR GRANULOMAS OR DYSPLASIA  Chronicity is manifested only by minimal architectural changes. The findings are compatible with clinically suspected segmental colitis associated with diverticulosis (SCAD) in an appropriate clinical background.  Past Medical History:  Diagnosis Date   Allergy    Arthritis    Ostroarthritis and RA   Cancer (HCC)    skin basal and squamous- upper back, left thigh   Family history of adverse reaction to anesthesia    post op nausea   GERD (gastroesophageal reflux disease)    Headache    Hypothyroidism    IBS (irritable bowel syndrome)    Pelvic inflammatory disease 1984   Pneumonia    age   PONV (postoperative nausea and vomiting)    Rheumatoid arthritis (HCC)    Past Surgical History:  Procedure Laterality Date   ABDOMINAL HYSTERECTOMY     ANTERIOR LAT LUMBAR FUSION Right 05/15/2021   Procedure: Right Lumbar three-four, Lumbar four-five Anterolateral lumbar interbody fusion;  Surgeon: Unice Pac, MD;  Location: [facility] OR;  Service: Neurosurgery;  Laterality: Right;   APPENDECTOMY     BREAST BIOPSY Left 2017   NEG- Fibroadenoma   DIAGNOSTIC LAPAROSCOPY  2007   LAPAROSCOPIC ENDOMETRIOSIS FULGURATION  1989   LAPAROTOMY  1984   LUMBAR PERCUTANEOUS PEDICLE SCREW 2 LEVEL N/A 05/15/2021   Procedure: Percutaneous pedicle screw fixation Lumbar three-five;  Surgeon: Unice Pac, MD;  Location:  [facility] OR;  Service: Neurosurgery;  Laterality: N/A;   SINUS SURGERY WITH INSTATRAK  2003    reports that she quit smoking about 22 years ago. Her smoking use included cigarettes. She has never used smokeless tobacco. She reports that  she does not drink alcohol and does not use drugs. family history includes Atrial fibrillation in her mother; Colon cancer in her paternal grandfather; Diabetes in her maternal grandmother and mother; Heart disease in her maternal grandmother; Hypertension in her father; Hypothyroidism in her father; Other in her father; Stroke in her brother. Allergies  Allergen Reactions   Sulfa Antibiotics Rash      Outpatient Encounter Medications as of 08/24/2024  Medication Sig   acetaminophen  (TYLENOL ) 325 MG tablet Take 2 tablets (650 mg total) by mouth every 6 (six) hours as needed for mild pain or headache.   alendronate  (FOSAMAX ) 70 MG tablet Take 70 mg by mouth once a week. Take with a full glass of water on an empty stomach.   Biotin  5 MG CAPS Take 5 mg by mouth daily.   cetirizine (ZYRTEC) 10 MG tablet Take 10 mg by mouth every evening.   Cholecalciferol  (VITAMIN D ) 50 MCG (2000 UT) tablet Take 2,000 Units by mouth daily.   cyanocobalamin  (,VITAMIN B-12,) 1000 MCG/ML injection Inject 1,000 mcg into the muscle every 30 (thirty) days.   diclofenac (VOLTAREN) 75 MG EC tablet Take 1 tablet by mouth daily.   estradiol  (ESTRACE ) 1 MG tablet Take 1.5 mg by mouth daily.   levothyroxine  (SYNTHROID ) 88 MCG tablet Take 1 tablet (88 mcg total) by mouth daily before breakfast.   MAGNESIUM CITRATE PO Take 600 mg by mouth every evening.   polyethylene glycol (MIRALAX  / GLYCOLAX ) 17 g packet Take 17 g by mouth every evening.   testosterone  cypionate (DEPOTESTOSTERONE CYPIONATE) 200 MG/ML injection Inject into the muscle every 30 (thirty) days.   [DISCONTINUED] fluconazole  (DIFLUCAN ) 150 MG tablet Take 1 tablet PO once. Repeat in 3 days if needed.   [DISCONTINUED] ipratropium (ATROVENT ) 0.03 % nasal spray Place 2 sprays into both nostrils every 12 (twelve) hours.   No facility-administered encounter medications on file as of 08/24/2024.    REVIEW OF SYSTEMS  : All other systems reviewed and negative except  where noted in the History of Present Illness.   PHYSICAL EXAM: BP 104/64 (BP Location: Right Arm, Patient Position: Sitting, Cuff Size: Normal)   Pulse 62   Ht 5' 2 (1.575 m)   Wt 132 lb 4 oz (60 kg)   BMI 24.19 kg/m  General: Well developed white female in no acute distress Head: Normocephalic and atraumatic Eyes:  Sclerae anicteric, conjunctiva pink. Ears: Normal auditory acuity Lungs: Clear throughout to auscultation; no W/R/R. Heart: Regular rate and rhythm; no M/R/G. Abdomen: Soft, non-distended.  BS present.  Mild RUQ TTP. Musculoskeletal: Symmetrical with no gross deformities  Skin: No lesions on visible extremities Extremities: No edema  Neurological: Alert oriented x 4, grossly non-focal Psychological:  Alert and cooperative. Normal mood and affect Assessment & Plan Suspected gallbladder dysfunction with epigastric pain and nocturnal vomiting Intermittent epigastric pain and nocturnal vomiting over the past six weeks, with episodes of severe cramping and vomiting occurring nightly for four weeks. CT scan showed no structural abnormalities. Differential diagnosis includes gallbladder dysfunction.  Symptoms have resolved spontaneously, but further investigation is warranted to rule out gallbladder dysfunction. - Ordered HIDA scan to assess gallbladder function - Consider EGD if HIDA negative to rule out ulcer as she does have  chronic NSAID use for her RA.  Lactose intolerance with chronic diarrhea Chronic diarrhea and gas, partially relieved by lactase enzyme supplementation. Symptoms have been ongoing for years, with recent exacerbation. Lactose intolerance suspected as a contributing factor, with dietary dairy intake identified as a potential trigger.  - Continue lactase enzyme supplementation - Consider dietary modification by reducing dairy intake for 2-3 weeks to assess symptom improvement  Fecal incontinence associated with loose stools Fecal incontinence episodes  associated with loose stools, occurring primarily at night. Symptoms have been present intermittently over the past ten years, with recent exacerbation. Pelvic floor dysfunction suspected as a contributing factor. Lactose intolerance may contribute to baseline loose stools, exacerbating incontinence. - Recommended pelvic floor physical therapy to strengthen pelvic floor muscles and improve sphincter tone - Encouraged consistent pelvic floor exercises to improve control over loose stools     CC:  [place], Quintin PARAS, MD

## 2024-08-24 NOTE — Patient Instructions (Addendum)
 Your provider has requested that you go to the basement level for lab work before leaving today. Press B on the elevator. The lab is located at the first door on the left as you exit the elevator.  Try Lactaid pill or lactose free diet.   Continue pelvic floor exercises  You have been scheduled for a HIDA scan at [facility] (1st floor) on Tuesday 09/12/24. Please arrive 30 minutes prior to your scheduled appointment at  9 am. Make certain not to have anything to eat or drink at least 6 hours prior to your test. Should this appointment date or time not work well for you, please call radiology scheduling at [phone].  _____________________________________________________________________ hepatobiliary (HIDA) scan is an imaging procedure used to diagnose problems in the liver, gallbladder and bile ducts. In the HIDA scan, a radioactive chemical or tracer is injected into a vein in your arm. The tracer is handled by the liver like bile. Bile is a fluid produced and excreted by your liver that helps your digestive system break down fats in the foods you eat. Bile is stored in your gallbladder and the gallbladder releases the bile when you eat a meal. A special nuclear medicine scanner (gamma camera) tracks the flow of the tracer from your liver into your gallbladder and small intestine.  During your HIDA scan  You'll be asked to change into a hospital gown before your HIDA scan begins. Your health care team will position you on a table, usually on your back. The radioactive tracer is then injected into a vein in your arm.The tracer travels through your bloodstream to your liver, where it's taken up by the bile-producing cells. The radioactive tracer travels with the bile from your liver into your gallbladder and through your bile ducts to your small intestine.You may feel some pressure while the radioactive tracer is injected into your vein. As you lie on the table, a special gamma camera is  positioned over your abdomen taking pictures of the tracer as it moves through your body. The gamma camera takes pictures continually for about an hour. You'll need to keep still during the HIDA scan. This can become uncomfortable, but you may find that you can lessen the discomfort by taking deep breaths and thinking about other things. Tell your health care team if you're uncomfortable. The radiologist will watch on a computer the progress of the radioactive tracer through your body. The HIDA scan may be stopped when the radioactive tracer is seen in the gallbladder and enters your small intestine. This typically takes about an hour. In some cases extra imaging will be performed if original images aren't satisfactory, if morphine  is given to help visualize the gallbladder or if the medication CCK is given to look at the contraction of the gallbladder. This test typically takes 2 hours to complete. ________________________________________________________________________

## 2024-08-25 LAB — TISSUE TRANSGLUTAMINASE ABS,IGG,IGA
(tTG) Ab, IgA: <1 U/mL
(tTG) Ab, IgG: <1 U/mL

## 2024-08-25 LAB — IGA: Immunoglobulin A: 222 mg/dL (ref 70–320)

## 2024-08-28 ENCOUNTER — Ambulatory Visit: Payer: Self-pay | Admitting: Gastroenterology

## 2024-09-01 DIAGNOSIS — F5222 Female sexual arousal disorder: Secondary | ICD-10-CM | POA: Diagnosis not present

## 2024-09-01 DIAGNOSIS — E538 Deficiency of other specified B group vitamins: Secondary | ICD-10-CM | POA: Diagnosis not present

## 2024-09-06 ENCOUNTER — Other Ambulatory Visit (HOSPITAL_COMMUNITY)

## 2024-09-11 ENCOUNTER — Other Ambulatory Visit: Payer: Self-pay

## 2024-09-11 ENCOUNTER — Telehealth: Payer: Self-pay

## 2024-09-11 DIAGNOSIS — R1011 Right upper quadrant pain: Secondary | ICD-10-CM

## 2024-09-11 DIAGNOSIS — R1013 Epigastric pain: Secondary | ICD-10-CM

## 2024-09-11 NOTE — Telephone Encounter (Signed)
Tina Olsen 

## 2024-09-11 NOTE — Telephone Encounter (Signed)
 Message sent to the pt via My Chart. She does view messages

## 2024-09-11 NOTE — Telephone Encounter (Signed)
-----   Message from Camie FORBES Furbish sent at 09/11/2024  1:27 PM EST ----- Regarding: FW: NM Hepato W/EjeCT Fract Communicated with Harlene. Please let patient know insurance is requiring an ultrasound before they will approve the HIDA scan.  If she is agreeable, can order RUQ US . ----- Message ----- FromBETHA Cordia, April D Sent: 09/11/2024   7:05 AM EST To: Powell LITTIE Misty, CMA; Harlene BIRCH Zehr, PA-C Subject: NM Hepato W/EjeCT Fract                        Good Morning, We received a email from [facility] Benefits stating the denied her procedure scheduled for tomorrow. Listed below is the reason why.  Clinical Rationale:  Your doctor told us  that you have pain in the right upper abdomen. Your doctor ordered a special test to check your liver and gallbladder. This test is needed for certain liver and gallbladder problems when ultrasound results were unclear. We reviewed the notes we have. The notes do not show that you had an ultrasound. Based on the information we have, this test is not medically necessary. We used [employer] Medical Benefits Management Clinical Guideline titled Nuclear Medicine Imaging to make this decision. You may view this guideline at [email or web address]/mbm-[email or web address].  If you wish to do a peer-to-peer, you can reach them at [phone] Member #: [id number] Order ID: [id number]    Please let the patient know as well. Thank you.

## 2024-09-12 ENCOUNTER — Encounter (HOSPITAL_COMMUNITY): Payer: Self-pay

## 2024-09-12 ENCOUNTER — Encounter (HOSPITAL_COMMUNITY)

## 2024-09-12 NOTE — Telephone Encounter (Signed)
 Yes, the pt has been advised and aware she will have US  first. The order has been placed and sent to the schedulers

## 2024-09-13 ENCOUNTER — Ambulatory Visit (HOSPITAL_COMMUNITY)
Admission: RE | Admit: 2024-09-13 | Discharge: 2024-09-13 | Disposition: A | Discharge status: Home / Self Care | Source: Ambulatory Visit | Attending: Gastroenterology | Admitting: Gastroenterology

## 2024-09-13 ENCOUNTER — Encounter (HOSPITAL_COMMUNITY): Payer: Self-pay

## 2024-09-13 ENCOUNTER — Ambulatory Visit (HOSPITAL_COMMUNITY)

## 2024-09-13 DIAGNOSIS — R1011 Right upper quadrant pain: Secondary | ICD-10-CM | POA: Diagnosis not present

## 2024-09-13 DIAGNOSIS — R1013 Epigastric pain: Secondary | ICD-10-CM | POA: Insufficient documentation

## 2024-09-14 ENCOUNTER — Other Ambulatory Visit: Payer: Self-pay

## 2024-09-14 ENCOUNTER — Ambulatory Visit: Payer: Self-pay | Admitting: Gastroenterology

## 2024-09-14 DIAGNOSIS — R1011 Right upper quadrant pain: Secondary | ICD-10-CM

## 2024-09-15 DIAGNOSIS — E538 Deficiency of other specified B group vitamins: Secondary | ICD-10-CM | POA: Diagnosis not present

## 2024-09-28 ENCOUNTER — Ambulatory Visit (INDEPENDENT_AMBULATORY_CARE_PROVIDER_SITE_OTHER): Payer: BC Managed Care – PPO | Admitting: Family Medicine

## 2024-09-28 ENCOUNTER — Encounter (HOSPITAL_BASED_OUTPATIENT_CLINIC_OR_DEPARTMENT_OTHER): Payer: Self-pay | Admitting: Family Medicine

## 2024-09-28 VITALS — BP 107/65 | HR 52 | Ht 62.0 in | Wt 130.0 lb

## 2024-09-28 DIAGNOSIS — Z0000 Encounter for general adult medical examination without abnormal findings: Secondary | ICD-10-CM

## 2024-09-28 DIAGNOSIS — Z23 Encounter for immunization: Secondary | ICD-10-CM

## 2024-09-28 NOTE — Progress Notes (Signed)
 Subjective:    CC: Annual Physical Exam  HPI: Tina Olsen is a 63 y.o. presenting for annual physical  I reviewed the past medical history, family history, social history, surgical history, and allergies today and no changes were needed.  Please see the problem list section below in epic for further details.  Past Medical History: Past Medical History:  Diagnosis Date   Allergy    Arthritis    Ostroarthritis and RA   Cancer (HCC)    skin basal and squamous- upper back, left thigh   Family history of adverse reaction to anesthesia    post op nausea   GERD (gastroesophageal reflux disease)    Headache    Hypothyroidism    IBS (irritable bowel syndrome)    Pelvic inflammatory disease 1984   Pneumonia    age   PONV (postoperative nausea and vomiting)    Rheumatoid arthritis (HCC)    Past Surgical History: Past Surgical History:  Procedure Laterality Date   ABDOMINAL HYSTERECTOMY     ANTERIOR LAT LUMBAR FUSION Right 05/15/2021   Procedure: Right Lumbar three-four, Lumbar four-five Anterolateral lumbar interbody fusion;  Surgeon: Unice Pac, MD;  Location: [facility] OR;  Service: Neurosurgery;  Laterality: Right;   APPENDECTOMY     BREAST BIOPSY Left 2017   NEG- Fibroadenoma   DIAGNOSTIC LAPAROSCOPY  2007   LAPAROSCOPIC ENDOMETRIOSIS FULGURATION  1989   LAPAROTOMY  1984   LUMBAR PERCUTANEOUS PEDICLE SCREW 2 LEVEL N/A 05/15/2021   Procedure: Percutaneous pedicle screw fixation Lumbar three-five;  Surgeon: Unice Pac, MD;  Location: [facility] OR;  Service: Neurosurgery;  Laterality: N/A;   SINUS SURGERY WITH INSTATRAK  2003   Social History: Social History   Socioeconomic History   Marital status: Single    Spouse name: Not on file   Number of children: 0   Years of education: Not on file   Highest education level: Doctorate  Occupational History   Occupation: professor/archaeologist  Tobacco Use   Smoking status: Former    Current packs/day: 0.00    Types:  Cigarettes    Quit date: 2003    Years since quitting: 22.9   Smokeless tobacco: Never  Vaping Use   Vaping status: Never Used  Substance and Sexual Activity   Alcohol use: No   Drug use: No   Sexual activity: Not Currently  Other Topics Concern   Not on file  Social History Narrative   Not on file   Social Drivers of Health   Financial Resource Strain: Low Risk  (09/27/2024)   Overall Financial Resource Strain (CARDIA)    Difficulty of Paying Living Expenses: Not very hard  Food Insecurity: No Food Insecurity (09/27/2024)   Hunger Vital Sign    Worried About Running Out of Food in the Last Year: Never true    Ran Out of Food in the Last Year: Never true  Transportation Needs: No Transportation Needs (09/27/2024)   PRAPARE - Administrator, Civil Service (Medical): No    Lack of Transportation (Non-Medical): No  Physical Activity: Sufficiently Active (09/27/2024)   Exercise Vital Sign    Days of Exercise per Week: 3 days    Minutes of Exercise per Session: 120 min  Stress: No Stress Concern Present (09/27/2024)   [employer] of Occupational Health - Occupational Stress Questionnaire    Feeling of Stress: Only a little  Social Connections: Moderately Isolated (09/27/2024)   Social Connection and Isolation Panel    Frequency of Communication with  Friends and Family: More than three times a week    Frequency of Social Gatherings with Friends and Family: Three times a week    Attends Religious Services: Never    Active Member of Clubs or Organizations: Yes    Attends Engineer, Structural: More than 4 times per year    Marital Status: Divorced   Family History: Family History  Problem Relation Age of Onset   Diabetes Mother    Atrial fibrillation Mother    Hypertension Father    Hypothyroidism Father    Other Father        DJD   Stroke Brother    Heart disease Maternal Grandmother    Diabetes Maternal Grandmother    Colon cancer Paternal  Grandfather    Breast cancer Neg Hx    Allergies: Allergies  Allergen Reactions   Sulfa Antibiotics Rash   Medications: See med rec.  Review of Systems: No headache, visual changes, nausea, vomiting, diarrhea, constipation, dizziness, abdominal pain, skin rash, fevers, chills, night sweats, swollen lymph nodes, weight loss, chest pain, body aches, joint swelling, muscle aches, shortness of breath, mood changes, visual or auditory hallucinations.  Objective:    BP 107/65 (BP Location: Right Arm, Patient Position: Sitting, Cuff Size: Normal)   Pulse (!) 52   Ht 5' 2 (1.575 m)   Wt 130 lb (59 kg)   SpO2 99%   BMI 23.78 kg/m   General: Well Developed, well nourished, and in no acute distress. Neuro: Alert and oriented x3, extra-ocular muscles intact, sensation grossly intact. Cranial nerves II through XII are intact, motor, sensory, and coordinative functions are all intact. HEENT: Normocephalic, atraumatic, pupils equal round reactive to light, neck supple, no masses, no lymphadenopathy, thyroid  nonpalpable. Oropharynx, nasopharynx, external ear canals are unremarkable. Skin: Warm and dry, no rashes noted. Cardiac: Regular rate and rhythm, no murmurs rubs or gallops. Respiratory: Clear to auscultation bilaterally. Not using accessory muscles, speaking in full sentences. Abdominal: Soft, nontender, nondistended, positive bowel sounds, no masses, no organomegaly. Musculoskeletal: Shoulder, elbow, wrist, hip, knee, ankle stable, and with full range of motion.  Impression and Recommendations:    Wellness examination Assessment & Plan: Routine HCM labs ordered. HCM reviewed/discussed. Anticipatory guidance regarding healthy weight, lifestyle and choices given. Recommend healthy diet.  Recommend approximately 150 minutes/week of moderate intensity exercise Recommend regular dental and vision exams Always use seatbelt/lap and shoulder restraints Recommend using smoke alarms and checking  batteries at least twice a year Recommend using sunscreen when outside Discussed colon cancer screening recommendations, options.  Patient is UTD Discussed recommendations for shingles vaccine.  Patient has received both shots Discussed immunization recommendations  Orders: -     CBC with Differential/Platelet -     Comprehensive metabolic panel with GFR -     TSH Rfx on Abnormal to Free T4 -     Lipid panel  Immunization due -     Pneumococcal Conjugate PCV21(Capvaxive)  Return in about 1 year (around 09/28/2025) for CPE.   ___________________________________________ Yazlyn Wentzel [place], MD, ABFM, CAQSM Primary Care and Sports Medicine [facility]

## 2024-09-28 NOTE — Assessment & Plan Note (Signed)
 Routine HCM labs ordered. HCM reviewed/discussed. Anticipatory guidance regarding healthy weight, lifestyle and choices given. Recommend healthy diet.  Recommend approximately 150 minutes/week of moderate intensity exercise Recommend regular dental and vision exams Always use seatbelt/lap and shoulder restraints Recommend using smoke alarms and checking batteries at least twice a year Recommend using sunscreen when outside Discussed colon cancer screening recommendations, options.  Patient is UTD Discussed recommendations for shingles vaccine.  Patient has received both shots Discussed immunization recommendations

## 2024-09-29 ENCOUNTER — Encounter (HOSPITAL_COMMUNITY)
Admission: RE | Admit: 2024-09-29 | Discharge: 2024-09-29 | Disposition: A | Discharge status: Home / Self Care | Source: Ambulatory Visit | Attending: Gastroenterology | Admitting: Gastroenterology

## 2024-09-29 ENCOUNTER — Ambulatory Visit (HOSPITAL_BASED_OUTPATIENT_CLINIC_OR_DEPARTMENT_OTHER): Payer: Self-pay | Admitting: Family Medicine

## 2024-09-29 DIAGNOSIS — R1011 Right upper quadrant pain: Secondary | ICD-10-CM | POA: Diagnosis not present

## 2024-09-29 LAB — CBC WITH DIFFERENTIAL/PLATELET
Basophils Absolute: 0.1 x10E3/uL (ref 0.0–0.2)
Basos: 1 %
EOS (ABSOLUTE): 0.1 x10E3/uL (ref 0.0–0.4)
Eos: 2 %
Hematocrit: 42 % (ref 34.0–46.6)
Hemoglobin: 14.3 g/dL (ref 11.1–15.9)
Immature Grans (Abs): 0 x10E3/uL (ref 0.0–0.1)
Immature Granulocytes: 0 %
Lymphocytes Absolute: 2.3 x10E3/uL (ref 0.7–3.1)
Lymphs: 41 %
MCH: 32.4 pg (ref 26.6–33.0)
MCHC: 34 g/dL (ref 31.5–35.7)
MCV: 95 fL (ref 79–97)
Monocytes Absolute: 0.5 x10E3/uL (ref 0.1–0.9)
Monocytes: 10 %
Neutrophils Absolute: 2.5 x10E3/uL (ref 1.4–7.0)
Neutrophils: 46 %
Platelets: 201 x10E3/uL (ref 150–450)
RBC: 4.41 x10E6/uL (ref 3.77–5.28)
RDW: 11.8 % (ref 11.7–15.4)
WBC: 5.5 x10E3/uL (ref 3.4–10.8)

## 2024-09-29 LAB — LIPID PANEL
Chol/HDL Ratio: 2.3 ratio (ref 0.0–4.4)
Cholesterol, Total: 169 mg/dL (ref 100–199)
HDL: 72 mg/dL (ref >39)
LDL Chol Calc (NIH): 83 mg/dL (ref 0–99)
Triglycerides: 75 mg/dL (ref 0–149)
VLDL Cholesterol Cal: 14 mg/dL (ref 5–40)

## 2024-09-29 LAB — COMPREHENSIVE METABOLIC PANEL WITH GFR
ALT: 15 IU/L (ref 0–32)
AST: 21 IU/L (ref 0–40)
Albumin: 4.3 g/dL (ref 3.9–4.9)
Alkaline Phosphatase: 34 IU/L — ABNORMAL LOW (ref 49–135)
BUN/Creatinine Ratio: 16 (ref 12–28)
BUN: 15 mg/dL (ref 8–27)
Bilirubin Total: 0.8 mg/dL (ref 0.0–1.2)
CO2: 23 mmol/L (ref 20–29)
Calcium: 9.5 mg/dL (ref 8.7–10.3)
Chloride: 103 mmol/L (ref 96–106)
Creatinine, Ser: 0.96 mg/dL (ref 0.57–1.00)
Globulin, Total: 1.8 g/dL (ref 1.5–4.5)
Glucose: 89 mg/dL (ref 70–99)
Potassium: 4.8 mmol/L (ref 3.5–5.2)
Sodium: 139 mmol/L (ref 134–144)
Total Protein: 6.1 g/dL (ref 6.0–8.5)
eGFR: 67 mL/min/1.73 (ref >59)

## 2024-09-29 LAB — TSH RFX ON ABNORMAL TO FREE T4: TSH: 2.19 u[IU]/mL (ref 0.450–4.500)

## 2024-09-29 MED ORDER — TECHNETIUM TC 99M MEBROFENIN IV KIT
5.4000 | PACK | Freq: Once | INTRAVENOUS | Status: AC
  Administered 2024-09-29: 5.4 via INTRAVENOUS

## 2024-10-02 ENCOUNTER — Other Ambulatory Visit: Payer: Self-pay | Admitting: *Deleted

## 2024-10-02 ENCOUNTER — Ambulatory Visit: Payer: Self-pay | Admitting: Physician Assistant

## 2024-10-02 DIAGNOSIS — F5222 Female sexual arousal disorder: Secondary | ICD-10-CM | POA: Diagnosis not present

## 2024-10-02 DIAGNOSIS — E538 Deficiency of other specified B group vitamins: Secondary | ICD-10-CM | POA: Diagnosis not present

## 2024-10-02 DIAGNOSIS — R948 Abnormal results of function studies of other organs and systems: Secondary | ICD-10-CM

## 2024-10-02 DIAGNOSIS — R1013 Epigastric pain: Secondary | ICD-10-CM

## 2024-10-02 DIAGNOSIS — R1011 Right upper quadrant pain: Secondary | ICD-10-CM

## 2024-10-02 DIAGNOSIS — R112 Nausea with vomiting, unspecified: Secondary | ICD-10-CM

## 2024-10-04 ENCOUNTER — Ambulatory Visit: Payer: Self-pay | Admitting: General Surgery

## 2024-10-04 DIAGNOSIS — E063 Autoimmune thyroiditis: Secondary | ICD-10-CM | POA: Diagnosis not present

## 2024-10-04 DIAGNOSIS — K828 Other specified diseases of gallbladder: Secondary | ICD-10-CM | POA: Diagnosis not present
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
## 2024-10-04 NOTE — Progress Notes (Signed)
 REFERRING PHYSICIAN:  Cayetano Harlene BIRCH, PA PROVIDER:  DANN DALLAS HUMMER, MD MRN: [id number] DOB: Apr 01, 1961 DATE OF ENCOUNTER: 10/04/2024 Subjective    Chief Complaint: New Consultation ( Gallbladder disease. )   History of Present Illness: Tina Olsen is a 63 y.o. female who is seen today as an office consultation for evaluation of New Consultation ( Gallbladder disease. )   Dr. [place] asked me to see Dr. Man regarding RUQ pain.  She has an approximately 2-1/2-year history of intermittent dyspepsia and bloating.  More recently, she has developed intermittent right upper quadrant abdominal pain.  She had a bad stretch where it was associated with frequent vomiting as well.  This led to further workup by her PCP which included liver function tests which were normal.  She had a right upper quadrant ultrasound which did not show any gallstones or other explanation.  She went on to have a HIDA scan which did show decreased gallbladder ejection fraction consistent with biliary dyskinesia.  I was asked to see her for consideration of cholecystectomy.  She has had multiple abdominal surgeries for GYN issues in the past.    Review of Systems: A complete review of systems was obtained from the patient.  I have reviewed this information and discussed as appropriate with the patient.  See HPI as well for other ROS.  ROS   Medical History: Past Medical History:  Diagnosis Date  . Cervical high risk human papillomavirus (HPV) DNA test positive 02/06/2014   S/P colpo 02-27-14  . Endometriosis of uterus 1984  . Hypothyroid   . Infertility management 1984  . LGSIL (low grade squamous intraepithelial dysplasia) 02/27/2014   S/P colpo - LEEP recommended  . PID (pelvic inflammatory disease)    S/P IUD insertion  . Rheumatoid arthritis(714.0) (CMS/HHS-HCC)   . Squamous cell carcinoma of back     Patient Active Problem List  Diagnosis  . Rheumatoid arthritis(714.0) (CMS/HHS-HCC)   . Hypoactive sexual desire disorder  . Elevated TSH  . Low vitamin B12 level  . Acute postoperative pain  . Allergic rhinitis due to allergen  . Carpal tunnel syndrome of right wrist  . Chronic low back pain  . Closed Bennett's fracture  . Concussion  . DDD (degenerative disc disease), cervical  . Degenerative lumbar spinal stenosis  . Hand pain, left  . Hand pain, right  . Hypothyroidism  . Leukocytosis  . Medication monitoring encounter  . Neck pain  . Osteopenia  . Paresthesia  . Rheumatoid arthritis (CMS/HHS-HCC)  . Rib fractures  . Cervical radiculopathy  . Unilateral primary osteoarthritis of first carpometacarpal joint, left hand  . Hepatitis B core antibody positive  . Joint pain  . Other long term (current) drug therapy  . Primary localized osteoarthrosis of multiple sites  . Primary osteoarthritis    Past Surgical History:  Procedure Laterality Date  . EXPLORATORY LAPAROTOMY  1984  . SALPINGO OOPHORECTOMY Left 1984  . APPENDECTOMY  1984   removed at same time as PID mass and left ovary  . OOPHORECTOMY  1984   left ovary removed with PID mass laparotomy  . PELVIC LAPAROSCOPY  1989  . OTHER SURGERY  2003   sinus surgery  . ENDOMETRIAL ABLATION  08/2009   Novasure  . HYSTERECTOMY  07/2014   TAH right salpingectomy  . ultrasound guided core needle biopsy Left 03/10/2016   path pending  . OTHER SURGERY  05/15/2021   spine surgery  . BREAST SURGERY    .  CERVICAL BIOPSY  W/ LOOP ELECTRODE EXCISION    . COLPOSCOPY  02-27-2014   LGSIL  . PELVIC LAPAROSCOPY Right 1989 and 2007   fallopian cyst excision     Allergies  Allergen Reactions  . Sulfa (Sulfonamide Antibiotics) Rash    Current Outpatient Medications on File Prior to Visit  Medication Sig Dispense Refill  . acetaminophen  (TYLENOL ) 325 MG tablet Take 650 mg by mouth every 6 (six) hours    . alendronate  (FOSAMAX ) 70 MG tablet Take 70 mg by mouth every 7 (seven) days    . biotin  5 mg capsule  Take 5 mg by mouth once daily.    . cetirizine (ZYRTEC) 10 MG tablet Take 10 mg by mouth once daily.    . cholecalciferol  (VITAMIN D3) 2,000 unit tablet Take 2,000 Units by mouth once daily    . cyanocobalamin  (VITAMIN B12) 1,000 mcg/mL injection Inject 1 mL (1,000 mcg total) into the muscle every 14 (fourteen) days Bring to office monthly for IM injection 2 mL 11  . diclofenac (VOLTAREN) 75 MG EC tablet Take 75 mg by mouth 2 (two) times daily with meals.    . estradioL  (ESTRACE ) 1 MG tablet Take 1 1/2 tab po q day 135 tablet 3  . fremanezumab-vfrm 225 mg/1.5 mL AtIn Inject 225 mg subcutaneously monthly 1.5 mL 5  . levothyroxine  (SYNTHROID ) 88 MCG tablet Take 1 tablet (88 mcg total) by mouth once daily 90 tablet 3  . magnesium oxide (MAG-OX) 400 mg (241.3 mg magnesium) tablet Take 400 mg by mouth once daily.    . SUMAtriptan (IMITREX) 100 MG tablet Take 1 tablet (100 mg total) by mouth as directed May take a second dose after 2 hours if needed. 9 tablet 5  . testosterone  cypionate (DEPO-TESTOSTERONE ) 200 mg/mL injection Inject 0.13 mLs (26 mg total) into the muscle every 28 (twenty-eight) days    . testosterone  cypionate (DEPO-TESTOSTERONE ) 200 mg/mL injection BRING TO OFFICE MONTHLY FOR IN THE MUSCLE INJECTION    . ubrogepant 100 mg Tab Take 100 mg by mouth once daily as needed If no improvement with 100 mg dose - can repeat 2nd dose in 2 hours. No more than 200 mg / day. 16 tablet 0   No current facility-administered medications on file prior to visit.    Family History  Problem Relation Age of Onset  . Diabetes Mother   . Heart disease Mother        Heart Attack, A fib  . Heart disease Maternal Grandmother   . Diabetes Maternal Grandmother   . Colon cancer Paternal Grandfather   . High blood pressure (Hypertension) Father   . Hypothyroidism Father   . Osteoarthritis Father   . Osteoporosis (Thinning of bones) Father   . Thyroid  disease Father   . Hypothyroidism Brother   . Bone  cancer Maternal Grandfather   . Alzheimer's disease Paternal Grandmother   . Osteoporosis (Thinning of bones) Paternal Grandmother   . Thyroid  disease Brother   . Hypothyroidism Brother      Social History   Tobacco Use  Smoking Status Former  . Current packs/day: 0.00  . Types: Cigarettes  . Quit date: 2003  . Years since quitting: 22.9  Smokeless Tobacco Never     Social History   Socioeconomic History  . Marital status: Divorced  Tobacco Use  . Smoking status: Former    Current packs/day: 0.00    Types: Cigarettes    Quit date: 2003    Years since quitting:  22.9  . Smokeless tobacco: Never  Vaping Use  . Vaping status: Never Used  Substance and Sexual Activity  . Alcohol use: No    Alcohol/week: 0.0 standard drinks of alcohol  . Drug use: No  . Sexual activity: Yes    Birth control/protection: Surgical   Social Drivers of Health   Financial Resource Strain: Low Risk  (09/27/2024)   Received from [facility]   Overall Financial Resource Strain (CARDIA)   . How hard is it for you to pay for the very basics like food, housing, medical care, and heating?: Not very hard  Food Insecurity: No Food Insecurity (09/27/2024)   Received from [facility]   Hunger Vital Sign   . Within the past 12 months, you worried that your food would run out before you got the money to buy more.: Never true   . Within the past 12 months, the food you bought just didn't last and you didn't have money to get more.: Never true  Transportation Needs: No Transportation Needs (09/27/2024)   Received from [facility] - Transportation   . In the past 12 months, has lack of transportation kept you from medical appointments or from getting medications?: No   . In the past 12 months, has lack of transportation kept you from meetings, work, or from getting things needed for daily living?: No  Physical Activity: Sufficiently Active (09/27/2024)   Received from [facility]   Exercise  Vital Sign   . On average, how many days per week do you engage in moderate to strenuous exercise (like a brisk walk)?: 3 days   . On average, how many minutes do you engage in exercise at this level?: 120 min  Stress: No Stress Concern Present (09/27/2024)   Received from [facility] - Occupational Stress Questionnaire   . Do you feel stress - tense, restless, nervous, or anxious, or unable to sleep at night because your mind is troubled all the time - these days?: Only a little  Social Connections: Moderately Isolated (09/27/2024)   Received from [facility]   Social Connection and Isolation Panel   . In a typical week, how many times do you talk on the phone with family, friends, or neighbors?: More than three times a week   . How often do you get together with friends or relatives?: Three times a week   . How often do you attend church or religious services?: Never   . Do you belong to any clubs or organizations such as church groups, unions, fraternal or athletic groups, or school groups?: Yes   . How often do you attend meetings of the clubs or organizations you belong to?: More than 4 times per year   . Are you married, widowed, divorced, separated, never married, or living with a partner?: Divorced  Housing Stability: Low Risk  (07/03/2024)   Housing Stability Vital Sign   . Unable to Pay for Housing in the Last Year: No   . Number of Times Moved in the Last Year: 0   . Homeless in the Last Year: No    Objective:   Vitals:   10/04/24 1124  BP: 103/57  Pulse: 60  Resp: 14  Temp: 36.7 C (98.1 F)  SpO2: 97%  Weight: 57.9 kg (127 lb 9.6 oz)  Height: 157.5 cm (5' 2)  PainSc:   5  PainLoc: Abdomen    Body mass index is  23.34 kg/m.  Physical Exam   General appearance - alert, well appearing, and in no distress Neck - supple, no significant adenopathy Chest - clear to auscultation, no wheezes, rales or rhonchi, symmetric air  entry Abdomen - soft, nondistended, very mild right upper quadrant tenderness to deep palpation.  No masses Musculoskeletal - no joint tenderness, deformity or swelling  Labs, Imaging and Diagnostic Testing: LFTs, ultrasound, and HIDA scan as above  Assessment and Plan:     Diagnoses and all orders for this visit:  Biliary dyskinesia    I have offered laparoscopic cholecystectomy.  I discussed procedure, risk, and benefits in detail with her.  I also discussed the likelihood of needing to make our periumbilical port above her umbilicus due to noted adhesions she has had in the past.  I discussed the expected postoperative course.  She is agreeable and looks forward to scheduling.  She is an doctor, general practice at [employer].    DANN DALLAS HUMMER, MD

## 2024-10-06 ENCOUNTER — Other Ambulatory Visit: Payer: Self-pay

## 2024-10-06 ENCOUNTER — Encounter (HOSPITAL_COMMUNITY): Payer: Self-pay | Admitting: General Surgery

## 2024-10-06 NOTE — Progress Notes (Signed)
 SDW call  Patient was given pre-op instructions over the phone. Patient verbalized understanding of instructions provided.  She denies any SOB, fever or cough   PCP - Dr. Quintin sheerer [place] Cardiologist -  Pulmonary:    PPM/ICD - denies Device Orders - na Rep Notified - na   Chest x-ray - na EKG -  na Stress Test - ECHO -  Cardiac Cath -   Sleep Study/sleep apnea/CPAP: denies  Non-diabetic  Blood Thinner Instructions: denies Aspirin Instructions:denies   ERAS Protcol - Clears until 1215  Anesthesia review: No   Your procedure is scheduled on Monday October 09, 2024  Report to [facility] Main Entrance A at  1245 PM., then check in with the Admitting office.  Call this number if you have problems the morning of surgery:  [phone]   If you have any questions prior to your surgery date call [phone]: Open Monday-Friday 8am-4pm If you experience any cold or flu symptoms such as cough, fever, chills, shortness of breath, etc. between now and your scheduled surgery, please notify us  at the above number     Remember:  Do not eat after midnight the night before your surgery  You may drink clear liquids until  1215 the day of your surgery.   Clear liquids allowed are: Water, Non-Citrus Juices (without pulp), Carbonated Beverages, Clear Tea, Black Coffee ONLY (NO MILK, CREAM OR POWDERED CREAMER of any kind), and Gatorade   Take these medicines the morning of surgery with A SIP OF WATER:  Estradiol , levothyroxine   As needed: Tylenol , zyrtec, imitrex  As of today, STOP taking any Aspirin (unless otherwise instructed by your surgeon) Aleve, Naproxen, Ibuprofen , Motrin , Advil , Goody's, BC's, all herbal medications, fish oil, and all vitamins. This includes your [employer]

## 2024-10-09 ENCOUNTER — Ambulatory Visit (HOSPITAL_COMMUNITY): Admitting: Anesthesiology

## 2024-10-09 ENCOUNTER — Encounter (HOSPITAL_COMMUNITY): Payer: Self-pay | Admitting: General Surgery

## 2024-10-09 ENCOUNTER — Ambulatory Visit (HOSPITAL_COMMUNITY)
Admission: RE | Admit: 2024-10-09 | Discharge: 2024-10-09 | Disposition: A | Discharge status: Home / Self Care | Attending: General Surgery | Admitting: General Surgery

## 2024-10-09 ENCOUNTER — Encounter (HOSPITAL_COMMUNITY)
Admission: RE | Disposition: A | Discharge status: Home / Self Care | Payer: Self-pay | Source: Home / Self Care | Attending: General Surgery

## 2024-10-09 DIAGNOSIS — K828 Other specified diseases of gallbladder: Secondary | ICD-10-CM | POA: Diagnosis not present

## 2024-10-09 DIAGNOSIS — K811 Chronic cholecystitis: Secondary | ICD-10-CM | POA: Diagnosis not present

## 2024-10-09 HISTORY — PX: CHOLECYSTECTOMY: SHX55

## 2024-10-09 SURGERY — LAPAROSCOPIC CHOLECYSTECTOMY
Anesthesia: General | Site: Abdomen

## 2024-10-09 MED ORDER — HYDROMORPHONE HCL 1 MG/ML IJ SOLN
INTRAMUSCULAR | Status: AC
  Filled 2024-10-09: qty 0.5

## 2024-10-09 MED ORDER — CHLORHEXIDINE GLUCONATE CLOTH 2 % EX PADS: 6.0000 | MEDICATED_PAD | Freq: Once | CUTANEOUS | Status: DC

## 2024-10-09 MED ORDER — OXYCODONE HCL 5 MG/5ML PO SOLN: 5.0000 mg | Freq: Once | ORAL | Status: DC | PRN

## 2024-10-09 MED ORDER — AMISULPRIDE (ANTIEMETIC) 5 MG/2ML IV SOLN: 10.0000 mg | Freq: Once | INTRAVENOUS | Status: DC | PRN

## 2024-10-09 MED ORDER — FENTANYL CITRATE (PF) 100 MCG/2ML IJ SOLN
INTRAMUSCULAR | Status: AC
  Filled 2024-10-09: qty 2

## 2024-10-09 MED ORDER — MEPERIDINE HCL 25 MG/ML IJ SOLN: 6.2500 mg | INTRAMUSCULAR | Status: DC | PRN

## 2024-10-09 MED ORDER — 0.9 % SODIUM CHLORIDE (POUR BTL) OPTIME
TOPICAL | Status: DC | PRN
  Administered 2024-10-09: 1000 mL

## 2024-10-09 MED ORDER — HYDROMORPHONE HCL 1 MG/ML IJ SOLN
INTRAMUSCULAR | Status: AC
  Filled 2024-10-09: qty 1

## 2024-10-09 MED ORDER — OXYCODONE HCL 5 MG PO TABS: 5.0000 mg | ORAL_TABLET | Freq: Once | ORAL | Status: DC | PRN

## 2024-10-09 MED ORDER — MIDAZOLAM HCL (PF) 2 MG/2ML IJ SOLN
INTRAMUSCULAR | Status: DC | PRN
  Administered 2024-10-09: 2 mg via INTRAVENOUS

## 2024-10-09 MED ORDER — CHLORHEXIDINE GLUCONATE 0.12 % MT SOLN
OROMUCOSAL | Status: AC
  Administered 2024-10-09: 15 mL via OROMUCOSAL
  Filled 2024-10-09: qty 15

## 2024-10-09 MED ORDER — ONDANSETRON HCL 4 MG/2ML IJ SOLN
INTRAMUSCULAR | Status: DC | PRN
  Administered 2024-10-09: 4 mg via INTRAVENOUS

## 2024-10-09 MED ORDER — OXYCODONE HCL 5 MG PO TABS: 5.0000 mg | ORAL_TABLET | Freq: Four times a day (QID) | ORAL | 0 refills | Status: AC | PRN

## 2024-10-09 MED ORDER — HYDROMORPHONE HCL 1 MG/ML IJ SOLN
0.2500 mg | INTRAMUSCULAR | Status: DC | PRN
  Administered 2024-10-09 (×2): 0.25 mg via INTRAVENOUS
  Administered 2024-10-09: 0.5 mg via INTRAVENOUS

## 2024-10-09 MED ORDER — ACETAMINOPHEN 500 MG PO TABS: 1000.0000 mg | ORAL_TABLET | ORAL | Status: AC

## 2024-10-09 MED ORDER — FENTANYL CITRATE (PF) 250 MCG/5ML IJ SOLN
INTRAMUSCULAR | Status: DC | PRN
  Administered 2024-10-09: 100 ug via INTRAVENOUS

## 2024-10-09 MED ORDER — PROPOFOL 10 MG/ML IV BOLUS
INTRAVENOUS | Status: AC
  Filled 2024-10-09: qty 20

## 2024-10-09 MED ORDER — PROPOFOL 500 MG/50ML IV EMUL
INTRAVENOUS | Status: DC | PRN
  Administered 2024-10-09: 150 ug/kg/min via INTRAVENOUS

## 2024-10-09 MED ORDER — ROCURONIUM BROMIDE 10 MG/ML (PF) SYRINGE
PREFILLED_SYRINGE | INTRAVENOUS | Status: DC | PRN
  Administered 2024-10-09: 50 mg via INTRAVENOUS
  Administered 2024-10-09: 20 mg via INTRAVENOUS

## 2024-10-09 MED ORDER — HYDROMORPHONE HCL 1 MG/ML IJ SOLN
INTRAMUSCULAR | Status: DC | PRN
  Administered 2024-10-09: .5 mg via INTRAVENOUS

## 2024-10-09 MED ORDER — CEFAZOLIN SODIUM-DEXTROSE 2-4 GM/100ML-% IV SOLN
INTRAVENOUS | Status: AC
  Filled 2024-10-09: qty 100

## 2024-10-09 MED ORDER — CEFAZOLIN SODIUM-DEXTROSE 2-4 GM/100ML-% IV SOLN
2.0000 g | INTRAVENOUS | Status: AC
  Administered 2024-10-09: 2 g via INTRAVENOUS

## 2024-10-09 MED ORDER — PROPOFOL 10 MG/ML IV BOLUS
INTRAVENOUS | Status: DC | PRN
  Administered 2024-10-09: 150 mg via INTRAVENOUS

## 2024-10-09 MED ORDER — ACETAMINOPHEN 500 MG PO TABS
ORAL_TABLET | ORAL | Status: AC
  Administered 2024-10-09: 1000 mg via ORAL
  Filled 2024-10-09: qty 2

## 2024-10-09 MED ORDER — CHLORHEXIDINE GLUCONATE 0.12 % MT SOLN: 15.0000 mL | OROMUCOSAL | Status: AC

## 2024-10-09 MED ORDER — LIDOCAINE 2% (20 MG/ML) 5 ML SYRINGE
INTRAMUSCULAR | Status: DC | PRN
  Administered 2024-10-09: 60 mg via INTRAVENOUS

## 2024-10-09 MED ORDER — EPHEDRINE SULFATE-NACL 50-0.9 MG/10ML-% IV SOSY
PREFILLED_SYRINGE | INTRAVENOUS | Status: DC | PRN
  Administered 2024-10-09 (×2): 5 mg via INTRAVENOUS

## 2024-10-09 MED ORDER — MIDAZOLAM HCL 2 MG/2ML IJ SOLN
INTRAMUSCULAR | Status: AC
  Filled 2024-10-09: qty 2

## 2024-10-09 MED ORDER — BUPIVACAINE-EPINEPHRINE 0.25% -1:200000 IJ SOLN
INTRAMUSCULAR | Status: DC | PRN
  Administered 2024-10-09: 11 mL

## 2024-10-09 MED ORDER — GABAPENTIN 300 MG PO CAPS: 300.0000 mg | ORAL_CAPSULE | ORAL | Status: AC

## 2024-10-09 MED ORDER — LACTATED RINGERS IV SOLN: INTRAVENOUS | Status: DC

## 2024-10-09 MED ORDER — DEXAMETHASONE SOD PHOSPHATE PF 10 MG/ML IJ SOLN
INTRAMUSCULAR | Status: DC | PRN
  Administered 2024-10-09: 10 mg via INTRAVENOUS

## 2024-10-09 MED ORDER — SUGAMMADEX SODIUM 200 MG/2ML IV SOLN
INTRAVENOUS | Status: DC | PRN
  Administered 2024-10-09: 200 mg via INTRAVENOUS

## 2024-10-09 MED ORDER — SODIUM CHLORIDE 0.9 % IR SOLN
Status: DC | PRN
  Administered 2024-10-09 (×2): 1000 mL

## 2024-10-09 MED ORDER — BUPIVACAINE-EPINEPHRINE (PF) 0.25% -1:200000 IJ SOLN
INTRAMUSCULAR | Status: AC
  Filled 2024-10-09: qty 30

## 2024-10-09 MED ORDER — ENSURE PRE-SURGERY PO LIQD: 296.0000 mL | Freq: Once | ORAL | Status: DC

## 2024-10-09 MED ORDER — GABAPENTIN 300 MG PO CAPS
ORAL_CAPSULE | ORAL | Status: AC
  Administered 2024-10-09: 300 mg via ORAL
  Filled 2024-10-09: qty 1

## 2024-10-09 SURGICAL SUPPLY — 33 items
BAG COUNTER SPONGE SURGICOUNT (BAG) ×1 IMPLANT
BLADE CLIPPER SURG (BLADE) IMPLANT
CANISTER SUCTION 3000ML PPV (SUCTIONS) ×1 IMPLANT
CHLORAPREP W/TINT 26 (MISCELLANEOUS) ×1 IMPLANT
CLIP APPLIE 5 13 M/L LIGAMAX5 (MISCELLANEOUS) ×1 IMPLANT
COVER SURGICAL LIGHT HANDLE (MISCELLANEOUS) ×1 IMPLANT
DERMABOND ADVANCED .7 DNX12 (GAUZE/BANDAGES/DRESSINGS) ×1 IMPLANT
ELECTRODE REM PT RTRN 9FT ADLT (ELECTROSURGICAL) ×1 IMPLANT
GLOVE BIO SURGEON STRL SZ8 (GLOVE) ×1 IMPLANT
GLOVE BIOGEL PI IND STRL 8 (GLOVE) ×1 IMPLANT
GOWN STRL REUS W/ TWL LRG LVL3 (GOWN DISPOSABLE) ×2 IMPLANT
GOWN STRL REUS W/ TWL XL LVL3 (GOWN DISPOSABLE) ×1 IMPLANT
IRRIGATION SUCT STRKRFLW 2 WTP (MISCELLANEOUS) ×1 IMPLANT
KIT BASIN OR (CUSTOM PROCEDURE TRAY) ×1 IMPLANT
KIT TURNOVER KIT B (KITS) ×1 IMPLANT
LHOOK LAP DISP 36CM (ELECTROSURGICAL) ×1 IMPLANT
NDL 22X1.5 STRL (OR ONLY) (MISCELLANEOUS) ×1 IMPLANT
PAD ARMBOARD POSITIONER FOAM (MISCELLANEOUS) ×1 IMPLANT
PENCIL BUTTON HOLSTER BLD 10FT (ELECTRODE) ×1 IMPLANT
POUCH RETRIEVAL ECOSAC 10 (ENDOMECHANICALS) ×1 IMPLANT
SCISSORS LAP 5X35 DISP (ENDOMECHANICALS) ×1 IMPLANT
SET TUBE SMOKE EVAC HIGH FLOW (TUBING) ×1 IMPLANT
SLEEVE Z-THREAD 5X100MM (TROCAR) ×2 IMPLANT
SOLN 0.9% NACL POUR BTL 1000ML (IV SOLUTION) ×1 IMPLANT
SOLN STERILE WATER BTL 1000 ML (IV SOLUTION) ×1 IMPLANT
SUT VIC AB 4-0 PS2 27 (SUTURE) ×1 IMPLANT
SUT VICRYL 0 UR6 27IN ABS (SUTURE) IMPLANT
TOWEL GREEN STERILE (TOWEL DISPOSABLE) ×1 IMPLANT
TOWEL GREEN STERILE FF (TOWEL DISPOSABLE) ×1 IMPLANT
TRAY LAPAROSCOPIC MC (CUSTOM PROCEDURE TRAY) ×1 IMPLANT
TROCAR BALLN 12MMX100 BLUNT (TROCAR) ×1 IMPLANT
TROCAR Z-THREAD OPTICAL 5X100M (TROCAR) ×1 IMPLANT
WARMER LAPAROSCOPE (MISCELLANEOUS) ×1 IMPLANT

## 2024-10-09 NOTE — Anesthesia Preprocedure Evaluation (Signed)
 Anesthesia Evaluation  Patient identified by MRN, date of birth, ID band Patient awake    Reviewed: Allergy & Precautions, H&P , NPO status , Patient's Chart, lab work & pertinent test results  History of Anesthesia Complications (+) PONV and history of anesthetic complications  Airway Mallampati: II  TM Distance: >3 FB Neck ROM: Full    Dental  (+) Dental Advisory Given   Pulmonary neg pulmonary ROS, former smoker   Pulmonary exam normal breath sounds clear to auscultation       Cardiovascular negative cardio ROS Normal cardiovascular exam Rhythm:Regular Rate:Normal     Neuro/Psych  Headaches  negative psych ROS   GI/Hepatic Neg liver ROS,GERD  ,,  Endo/Other  Hypothyroidism    Renal/GU negative Renal ROS  negative genitourinary   Musculoskeletal  (+) Arthritis , Osteoarthritis,    Abdominal   Peds negative pediatric ROS (+)  Hematology negative hematology ROS (+)   Anesthesia Other Findings   Reproductive/Obstetrics negative OB ROS                              Anesthesia Physical Anesthesia Plan  ASA: 2  Anesthesia Plan: General   Post-op Pain Management: Tylenol  PO (pre-op)* and Gabapentin  PO (pre-op)*   Induction: Intravenous  PONV Risk Score and Plan: 4 or greater and Ondansetron , Dexamethasone , Midazolam  and Treatment may vary due to age or medical condition  Airway Management Planned: Oral ETT  Additional Equipment:   Intra-op Plan:   Post-operative Plan: Extubation in OR  Informed Consent: I have reviewed the patients History and Physical, chart, labs and discussed the procedure including the risks, benefits and alternatives for the proposed anesthesia with the patient or authorized representative who has indicated his/her understanding and acceptance.     Dental advisory given  Plan Discussed with: CRNA  Anesthesia Plan Comments:         Anesthesia  Quick Evaluation

## 2024-10-09 NOTE — Transfer of Care (Signed)
 Immediate Anesthesia Transfer of Care Note  Patient: Tina Olsen  Procedure(s) Performed: LAPAROSCOPIC CHOLECYSTECTOMY (Abdomen)  Patient Location: PACU  Anesthesia Type:General  Level of Consciousness: awake, alert , and oriented  Airway & Oxygen Therapy: Patient Spontanous Breathing  Post-op Assessment: Report given to RN and Post -op Vital signs reviewed and stable  Post vital signs: Reviewed and stable  Last Vitals:  Vitals Value Taken Time  BP 115/61 10/09/24 16:45  Temp    Pulse 68 10/09/24 16:47  Resp 26 10/09/24 16:47  SpO2 100 % 10/09/24 16:47  Vitals shown include unfiled device data.  Last Pain:  Vitals:   10/09/24 1317  TempSrc:   PainSc: 4       Patients Stated Pain Goal: 0 (10/09/24 1317)  Complications: There were no known notable events for this encounter.

## 2024-10-09 NOTE — Op Note (Signed)
  10/09/2024  4:35 PM  PATIENT:  Tina Olsen Man  63 y.o. female  PRE-OPERATIVE DIAGNOSIS:  BILIARY DYSKINESIA  POST-OPERATIVE DIAGNOSIS:  BILIARY DYSKINESIA  PROCEDURE:  Procedure(s): LAPAROSCOPIC CHOLECYSTECTOMY  SURGEON:  Surgeon(s): Sebastian Moles, MD  ASSISTANTS: none   ANESTHESIA:   local and general  EBL:  Total I/O In: 600 [I.V.:600] Out: -   BLOOD ADMINISTERED:none  DRAINS: none   SPECIMEN:  Excision  DISPOSITION OF SPECIMEN:  PATHOLOGY  COUNTS:  YES  DICTATION: .Dragon Dictation Procedure detail: Informed consent was obtained.  The patient received intravenous antibiotics.  She was brought to the operating room and general endotracheal anesthesia was administered by the anesthesia staff.  Her abdomen was prepped and draped in a sterile fashion.  Timeout procedure was performed.In light of HX of lower abdominal adhesions the supraumbilical region was infiltrated with local. Supraumbilical incision was made. Subcutaneous tissues were dissected down revealing the anterior fascia. This was divided sharply along the midline. Peritoneal cavity was entered under direct vision without complication. A 0 Vicryl pursestring was placed around the fascial opening. Hassan trocar was inserted into the abdomen. The abdomen was insufflated with carbon dioxide in standard fashion. Under direct vision a 5 mm epigastric and 5 mm right abdominal port x 2 were placed.  Local was injected at each port site.  Laparoscopic expiration revealed a distended gallbladder.  It was retracted superior and medially by the dome.  The infundibulum was retracted inferior and laterally.  Dissection began laterally and progressed medially identifying the cystic artery first.  This was clipped twice proximally, once distally and divided.  The cystic duct was then dissected until we had a critical view of safety.  It was clipped 3 times proximally, once distally and divided.  The gallbladder was taken off  the liver bed with Bovie cautery.  The liver bed was cauterized to get excellent hemostasis.  The gallbladder was placed in a bag and removed from the abdomen.  It was sent to pathology.  The liver bed was copiously irrigated.  It was dry.  All clips remained in good position.  Irrigation fluid was evacuated.  Ports were removed under direct vision.  Pneumoperitoneum was released.  The supraumbilical fascia was closed by tying the pursestring.  All 4 wounds were irrigated and the skin of each was closed with 4-0 Vicryl followed by Dermabond.  All counts were correct.  She tolerated the procedure well without apparent complication and was taken recovery in stable condition.  PATIENT DISPOSITION:  PACU - hemodynamically stable.   Delay start of Pharmacological VTE agent (>24hrs) due to surgical blood loss or risk of bleeding:  no  Moles Sebastian, MD, MPH, FACS Pager: [phone]  12/1/20254:35 PM

## 2024-10-09 NOTE — H&P (Signed)
 Tina Olsen is an 63 y.o. female.   Chief Complaint: biliary dyskinesia HPI: 63yo F presents for laparoscopic cholecystectomy for biliary dyskinesia. She continues to have symptoms since I saw her in the office.  Past Medical History:  Diagnosis Date   Allergy    Arthritis    Ostroarthritis and RA   Cancer (HCC)    skin basal and squamous- upper back, left thigh   Family history of adverse reaction to anesthesia    post op nausea   GERD (gastroesophageal reflux disease)    Headache    Hypothyroidism    IBS (irritable bowel syndrome)    Pelvic inflammatory disease 1984   Pneumonia    age   PONV (postoperative nausea and vomiting)    Rheumatoid arthritis (HCC)     Past Surgical History:  Procedure Laterality Date   ABDOMINAL HYSTERECTOMY     ANTERIOR LAT LUMBAR FUSION Right 05/15/2021   Procedure: Right Lumbar three-four, Lumbar four-five Anterolateral lumbar interbody fusion;  Surgeon: Unice Pac, MD;  Location: [facility] OR;  Service: Neurosurgery;  Laterality: Right;   APPENDECTOMY     BREAST BIOPSY Left 2017   NEG- Fibroadenoma   DIAGNOSTIC LAPAROSCOPY  2007   HAND SURGERY Bilateral    LAPAROSCOPIC ENDOMETRIOSIS FULGURATION  1989   LAPAROTOMY  1984   LUMBAR PERCUTANEOUS PEDICLE SCREW 2 LEVEL N/A 05/15/2021   Procedure: Percutaneous pedicle screw fixation Lumbar three-five;  Surgeon: Unice Pac, MD;  Location: [facility] OR;  Service: Neurosurgery;  Laterality: N/A;   SINUS SURGERY WITH INSTATRAK  2003    Family History  Problem Relation Age of Onset   Diabetes Mother    Atrial fibrillation Mother    Hypertension Father    Hypothyroidism Father    Other Father        DJD   Stroke Brother    Heart disease Maternal Grandmother    Diabetes Maternal Grandmother    Colon cancer Paternal Grandfather    Breast cancer Neg Hx    Social History:  reports that she quit smoking about 22 years ago. Her smoking use included cigarettes. She has never used smokeless tobacco.  She reports that she does not drink alcohol and does not use drugs.  Allergies:  Allergies  Allergen Reactions   Sulfa Antibiotics Rash    Medications Prior to Admission  Medication Sig Dispense Refill   acetaminophen  (TYLENOL ) 325 MG tablet Take 2 tablets (650 mg total) by mouth every 6 (six) hours as needed for mild pain or headache.     alendronate  (FOSAMAX ) 70 MG tablet Take 70 mg by mouth once a week. Take with a full glass of water on an empty stomach.     Biotin  5 MG CAPS Take 5 mg by mouth daily.     Calcium-Magnesium (CAL-MAG PO) Take 1 tablet by mouth daily.     cetirizine (ZYRTEC) 10 MG tablet Take 10 mg by mouth daily as needed for allergies.     Cholecalciferol  (VITAMIN D ) 50 MCG (2000 UT) tablet Take 2,000 Units by mouth daily.     cyanocobalamin  (,VITAMIN B-12,) 1000 MCG/ML injection Inject 1,000 mcg into the muscle every 14 (fourteen) days.  6   diclofenac (VOLTAREN) 75 MG EC tablet Take 75 mg by mouth 2 (two) times daily.     estradiol  (ESTRACE ) 1 MG tablet Take 1.5 mg by mouth daily.     levothyroxine  (SYNTHROID ) 88 MCG tablet Take 1 tablet (88 mcg total) by mouth daily before breakfast. 60 tablet  1   polyethylene glycol (MIRALAX  / GLYCOLAX ) 17 g packet Take 17 g by mouth daily as needed for mild constipation or moderate constipation.     SUMAtriptan (IMITREX) 100 MG tablet Take 100 mg by mouth daily as needed for migraine.     testosterone  cypionate (DEPOTESTOSTERONE CYPIONATE) 200 MG/ML injection Inject 26 mg into the muscle every 30 (thirty) days.      No results found for this or any previous visit (from the past 48 hours). No results found.  Review of Systems  Blood pressure 129/64, pulse 60, temperature 97.9 F (36.6 C), temperature source Oral, resp. rate 17, height 5' 2 (1.575 m), weight 57.6 kg, SpO2 100%. Physical Exam Cardiovascular:     Rate and Rhythm: Normal rate and regular rhythm.  Pulmonary:     Effort: Pulmonary effort is normal.     Breath  sounds: Normal breath sounds. No wheezing.  Abdominal:     General: Abdomen is flat.     Tenderness: There is no abdominal tenderness. There is no guarding or rebound.  Skin:    General: Skin is warm.  Neurological:     Mental Status: She is alert and oriented to person, place, and time.  Psychiatric:        Mood and Affect: Mood normal.      Assessment/Plan Biliary dyskinesia - for laparoscopic cholecystectomy. I discussed the procedure, risks, and benefits with her and she agrees. I also again discussed the expected post-op course.   Dann FORBES Hummer, MD 10/09/2024, 1:48 PM

## 2024-10-09 NOTE — Anesthesia Procedure Notes (Addendum)
 Procedure Name: Intubation Date/Time: 10/09/2024 3:49 PM  Performed by: Elby Raelene SAUNDERS, CRNAPre-anesthesia Checklist: Patient identified, Emergency Drugs available, Suction available and Patient being monitored Patient Re-evaluated:Patient Re-evaluated prior to induction Oxygen Delivery Method: Circle System Utilized Preoxygenation: Pre-oxygenation with 100% oxygen Induction Type: IV induction and Cricoid Pressure applied Ventilation: Mask ventilation without difficulty Laryngoscope Size: Mac and 4 Grade View: Grade III Tube type: Oral Tube size: 7.0 mm Number of attempts: 1 Airway Equipment and Method: Bougie stylet Placement Confirmation: ETT inserted through vocal cords under direct vision, positive ETCO2 and breath sounds checked- equal and bilateral Secured at: 21 cm Tube secured with: Tape Dental Injury: Teeth and Oropharynx as per pre-operative assessment  Difficulty Due To: Difficulty was unanticipated and Difficult Airway- due to anterior larynx

## 2024-10-10 ENCOUNTER — Encounter (HOSPITAL_COMMUNITY): Payer: Self-pay | Admitting: General Surgery

## 2024-10-10 NOTE — Anesthesia Postprocedure Evaluation (Signed)
 Anesthesia Post Note  Patient: Tina Olsen  Procedure(s) Performed: LAPAROSCOPIC CHOLECYSTECTOMY (Abdomen)     Patient location during evaluation: PACU Anesthesia Type: General Level of consciousness: awake and alert Pain management: pain level controlled Vital Signs Assessment: post-procedure vital signs reviewed and stable Respiratory status: spontaneous breathing, nonlabored ventilation, respiratory function stable and patient connected to nasal cannula oxygen Cardiovascular status: blood pressure returned to baseline and stable Postop Assessment: no apparent nausea or vomiting Anesthetic complications: no   There were no known notable events for this encounter.  Last Vitals:  Vitals:   10/09/24 1845 10/09/24 1900  BP: 122/73 122/76  Pulse: 60 (!) 57  Resp: 12 11  Temp: (!) 35.8 C (!) 36.1 C  SpO2: 99% 100%    Last Pain:  Vitals:   10/09/24 1815  TempSrc:   PainSc: Asleep                 Thom JONELLE Peoples

## 2024-10-11 LAB — SURGICAL PATHOLOGY

## 2024-10-13 DIAGNOSIS — E538 Deficiency of other specified B group vitamins: Secondary | ICD-10-CM | POA: Diagnosis not present

## 2024-10-27 DIAGNOSIS — F5222 Female sexual arousal disorder: Secondary | ICD-10-CM | POA: Diagnosis not present

## 2024-10-27 DIAGNOSIS — E538 Deficiency of other specified B group vitamins: Secondary | ICD-10-CM | POA: Diagnosis not present

## 2024-11-06 DIAGNOSIS — E538 Deficiency of other specified B group vitamins: Secondary | ICD-10-CM | POA: Diagnosis not present

## 2024-12-12 ENCOUNTER — Telehealth: Admitting: Physician Assistant

## 2024-12-12 DIAGNOSIS — T3695XA Adverse effect of unspecified systemic antibiotic, initial encounter: Secondary | ICD-10-CM

## 2024-12-12 DIAGNOSIS — J0100 Acute maxillary sinusitis, unspecified: Secondary | ICD-10-CM | POA: Diagnosis not present

## 2024-12-12 DIAGNOSIS — B379 Candidiasis, unspecified: Secondary | ICD-10-CM | POA: Diagnosis not present

## 2024-12-12 MED ORDER — AMOXICILLIN-POT CLAVULANATE 875-125 MG PO TABS: 1.0000 | ORAL_TABLET | Freq: Two times a day (BID) | ORAL | 0 refills | Status: AC

## 2024-12-12 MED ORDER — FLUCONAZOLE 150 MG PO TABS: ORAL_TABLET | ORAL | 0 refills | Status: AC

## 2024-12-12 NOTE — Progress Notes (Signed)
 " Virtual Visit Consent   Tina Olsen, you are scheduled for a virtual visit with a [facility] provider today. Just as with appointments in the office, your consent must be obtained to participate. Your consent will be active for this visit and any virtual visit you may have with one of our providers in the next 365 days. If you have a MyChart account, a copy of this consent can be sent to you electronically.  As this is a virtual visit, video technology does not allow for your provider to perform a traditional examination. This may limit your provider's ability to fully assess your condition. If your provider identifies any concerns that need to be evaluated in person or the need to arrange testing (such as labs, EKG, etc.), we will make arrangements to do so. Although advances in technology are sophisticated, we cannot ensure that it will always work on either your end or our end. If the connection with a video visit is poor, the visit may have to be switched to a telephone visit. With either a video or telephone visit, we are not always able to ensure that we have a secure connection.  By engaging in this virtual visit, you consent to the provision of healthcare and authorize for your insurance to be billed (if applicable) for the services provided during this visit. Depending on your insurance coverage, you may receive a charge related to this service.  I need to obtain your verbal consent now. Are you willing to proceed with your visit today? Tina Olsen has provided verbal consent on 12/12/2024 for a virtual visit (video or telephone). Tina Olsen, [place]  Date: 12/12/2024 5:38 PM   Virtual Visit via Video Note   I, Tina Olsen, connected with  Tina Olsen  ([id number], [birth year]) on 12/12/24 at  5:30 PM EST by a video-enabled telemedicine application and verified that I am speaking with the correct person using two identifiers.  Location: Patient: Virtual Visit Location  Patient: Home Provider: Virtual Visit Location Provider: Home Office   I discussed the limitations of evaluation and management by telemedicine and the availability of in person appointments. The patient expressed understanding and agreed to proceed.    History of Present Illness: Tina Olsen is a 64 y.o. who identifies as a female who was assigned female at birth, and is being seen today for sinus pain and pressure.  HPI: 63y/o F presents for a telehealth video visit for c/o sinus pressure, pain, congestion L face> R face x 10 days. Low grade fever with max temp. Of 99.8 degrees. No known exposure to Covid or Flu. Has been taking otc Tylenol , Zyrtec, sinus rinse multiple times, but getting worse.      Problems:  Patient Active Problem List   Diagnosis Date Noted   Abdominal pain, epigastric 08/24/2024   RUQ abdominal pain 08/24/2024   Nausea and vomiting 08/24/2024   Incontinence of feces 08/24/2024   Pain in limb 07/19/2024   Vomiting and diarrhea 07/19/2024   Hepatitis B core antibody positive 03/14/2024   Joint pain 03/14/2024   Long term current use of therapeutic drug 03/14/2024   Primary osteoarthritis involving multiple joints 03/14/2024   Wellness examination 09/28/2023   DDD (degenerative disc disease), cervical 07/27/2023   Hypothyroidism 07/27/2023   Rupture of ulnar collateral ligament of thumb 06/14/2023   Acute postoperative pain 03/02/2023   Hand pain, right 02/04/2023   Concussion 01/28/2023   Closed Bennett's fracture 12/30/2022   Hand pain,  left 12/30/2022   Rib fractures 12/27/2022   Leukocytosis 08/04/2022   Medication monitoring encounter 08/04/2022   Scoliosis of lumbar spine, unspecified scoliosis type 05/15/2021   Carpal tunnel syndrome of right wrist 12/25/2020   Paresthesia 12/25/2020   Rheumatoid arthritis (HCC) 01/07/2018   Allergic rhinitis due to allergen 01/22/2017   Elevated TSH 02/13/2016    Allergies: Allergies[1] Medications:  Current Medications[2]  Observations/Objective: Patient is well-developed, well-nourished in no acute distress.  Resting comfortably  at home.  Head is normocephalic, atraumatic.  No labored breathing.  Speech is clear and coherent with logical content.  Patient is alert and oriented at baseline.    Assessment and Plan: 1. Acute non-recurrent maxillary sinusitis (Primary) - amoxicillin -clavulanate (AUGMENTIN ) 875-125 MG tablet; Take 1 tablet by mouth 2 (two) times daily for 7 days.  Dispense: 14 tablet; Refill: 0  2. Antibiotic-induced yeast infection - fluconazole  (DIFLUCAN ) 150 MG tablet; Take one tablet by mouth x 1 day. May repeat in 72 hours if symptoms don't improve.  Dispense: 2 tablet; Refill: 0  Stay well hydrated with clear liquids Start medicines as prescribed. Continue to watch for worsening symptoms. Schedule a virtual appointment or follow up at an urgent care clinic if symptoms don't improve.  Pt verbalized understanding and in agreement.    Follow Up Instructions: I discussed the assessment and treatment plan with the patient. The patient was provided an opportunity to ask questions and all were answered. The patient agreed with the plan and demonstrated an understanding of the instructions.  A copy of instructions were sent to the patient via MyChart unless otherwise noted below.   Patient has requested to receive PHI (AVS, Work Notes, etc) pertaining to this video visit through e-mail as they are currently without active MyChart. They have voiced understand that email is not considered secure and their health information could be viewed by someone other than the patient.   The patient was advised to call back or seek an in-person evaluation if the symptoms worsen or if the condition fails to improve as anticipated.    Tina Oatley, PA-C     [1]  Allergies Allergen Reactions   Sulfa Antibiotics Rash  [2]  Current Outpatient Medications:     amoxicillin -clavulanate (AUGMENTIN ) 875-125 MG tablet, Take 1 tablet by mouth 2 (two) times daily for 7 days., Disp: 14 tablet, Rfl: 0   fluconazole  (DIFLUCAN ) 150 MG tablet, Take one tablet by mouth x 1 day. May repeat in 72 hours if symptoms don't improve., Disp: 2 tablet, Rfl: 0   acetaminophen  (TYLENOL ) 325 MG tablet, Take 2 tablets (650 mg total) by mouth every 6 (six) hours as needed for mild pain or headache., Disp: , Rfl:    alendronate  (FOSAMAX ) 70 MG tablet, Take 70 mg by mouth once a week. Take with a full glass of water on an empty stomach., Disp: , Rfl:    Biotin  5 MG CAPS, Take 5 mg by mouth daily., Disp: , Rfl:    Calcium-Magnesium (CAL-MAG PO), Take 1 tablet by mouth daily., Disp: , Rfl:    cetirizine (ZYRTEC) 10 MG tablet, Take 10 mg by mouth daily as needed for allergies., Disp: , Rfl:    Cholecalciferol  (VITAMIN D ) 50 MCG (2000 UT) tablet, Take 2,000 Units by mouth daily., Disp: , Rfl:    cyanocobalamin  (,VITAMIN B-12,) 1000 MCG/ML injection, Inject 1,000 mcg into the muscle every 14 (fourteen) days., Disp: , Rfl: 6   diclofenac (VOLTAREN) 75 MG EC tablet, Take 75 mg  by mouth 2 (two) times daily., Disp: , Rfl:    estradiol  (ESTRACE ) 1 MG tablet, Take 1.5 mg by mouth daily., Disp: , Rfl:    levothyroxine  (SYNTHROID ) 88 MCG tablet, Take 1 tablet (88 mcg total) by mouth daily before breakfast., Disp: 60 tablet, Rfl: 1   oxyCODONE  (OXY IR/ROXICODONE ) 5 MG immediate release tablet, Take 1 tablet (5 mg total) by mouth every 6 (six) hours as needed for severe pain (pain score 7-10)., Disp: 15 tablet, Rfl: 0   polyethylene glycol (MIRALAX  / GLYCOLAX ) 17 g packet, Take 17 g by mouth daily as needed for mild constipation or moderate constipation., Disp: , Rfl:    SUMAtriptan (IMITREX) 100 MG tablet, Take 100 mg by mouth daily as needed for migraine., Disp: , Rfl:    testosterone  cypionate (DEPOTESTOSTERONE CYPIONATE) 200 MG/ML injection, Inject 26 mg into the muscle every 30 (thirty) days.,  Disp: , Rfl:   "

## 2025-10-01 ENCOUNTER — Encounter (HOSPITAL_BASED_OUTPATIENT_CLINIC_OR_DEPARTMENT_OTHER): Admitting: Family Medicine
